# Patient Record
Sex: Male | Born: 1950
Health system: Southern US, Community
[De-identification: ages and names within clinical notes are randomized; demographics above are authoritative.]

## PROBLEM LIST (undated history)

## (undated) DIAGNOSIS — E785 Hyperlipidemia, unspecified: Secondary | ICD-10-CM

## (undated) DIAGNOSIS — J302 Other seasonal allergic rhinitis: Secondary | ICD-10-CM

## (undated) DIAGNOSIS — I10 Essential (primary) hypertension: Secondary | ICD-10-CM

## (undated) DIAGNOSIS — C801 Malignant (primary) neoplasm, unspecified: Secondary | ICD-10-CM

## (undated) DIAGNOSIS — H269 Unspecified cataract: Secondary | ICD-10-CM

## (undated) DIAGNOSIS — R0600 Dyspnea, unspecified: Secondary | ICD-10-CM

## (undated) DIAGNOSIS — B1920 Unspecified viral hepatitis C without hepatic coma: Secondary | ICD-10-CM

## (undated) DIAGNOSIS — T7840XA Allergy, unspecified, initial encounter: Secondary | ICD-10-CM

## (undated) DIAGNOSIS — Z87442 Personal history of urinary calculi: Secondary | ICD-10-CM

## (undated) DIAGNOSIS — N189 Chronic kidney disease, unspecified: Secondary | ICD-10-CM

## (undated) DIAGNOSIS — Z973 Presence of spectacles and contact lenses: Secondary | ICD-10-CM

## (undated) DIAGNOSIS — Z9289 Personal history of other medical treatment: Secondary | ICD-10-CM

## (undated) HISTORY — PX: COLONOSCOPY: SHX174

## (undated) HISTORY — DX: Essential (primary) hypertension: I10

## (undated) HISTORY — PX: CHEST TUBE INSERTION: SHX231

## (undated) HISTORY — PX: PORT-A-CATH REMOVAL: SHX5289

## (undated) HISTORY — DX: Hyperlipidemia, unspecified: E78.5

## (undated) HISTORY — DX: Unspecified cataract: H26.9

## (undated) HISTORY — PX: LUNG SURGERY: SHX703

## (undated) HISTORY — DX: Chronic kidney disease, unspecified: N18.9

## (undated) HISTORY — DX: Allergy, unspecified, initial encounter: T78.40XA

## (undated) HISTORY — PX: POLYPECTOMY: SHX149

---

## 2004-11-08 ENCOUNTER — Ambulatory Visit: Payer: Self-pay | Admitting: Internal Medicine

## 2004-11-16 ENCOUNTER — Ambulatory Visit: Payer: Self-pay | Admitting: Internal Medicine

## 2004-12-13 LAB — HM COLONOSCOPY

## 2005-05-28 ENCOUNTER — Ambulatory Visit: Payer: Self-pay | Admitting: Gastroenterology

## 2005-06-15 ENCOUNTER — Ambulatory Visit: Payer: Self-pay | Admitting: Gastroenterology

## 2005-06-15 ENCOUNTER — Encounter (INDEPENDENT_AMBULATORY_CARE_PROVIDER_SITE_OTHER): Payer: Self-pay | Admitting: Specialist

## 2006-07-08 ENCOUNTER — Ambulatory Visit: Payer: Self-pay | Admitting: Internal Medicine

## 2006-10-10 ENCOUNTER — Ambulatory Visit: Payer: Self-pay | Admitting: Internal Medicine

## 2006-10-10 LAB — CONVERTED CEMR LAB
Alkaline Phosphatase: 86 units/L (ref 39–117)
Basophils Absolute: 0 10*3/uL (ref 0.0–0.1)
CO2: 26 meq/L (ref 19–32)
Calcium: 9.2 mg/dL (ref 8.4–10.5)
Chol/HDL Ratio, serum: 2.8
Cholesterol: 130 mg/dL (ref 0–200)
Glomerular Filtration Rate, Af Am: 100 mL/min/{1.73_m2}
Glucose, Bld: 105 mg/dL — ABNORMAL HIGH (ref 70–99)
LDL Cholesterol: 69 mg/dL (ref 0–99)
Lymphocytes Relative: 34.6 % (ref 12.0–46.0)
MCV: 88.3 fL (ref 78.0–100.0)
Monocytes Absolute: 0.9 10*3/uL — ABNORMAL HIGH (ref 0.2–0.7)
Monocytes Relative: 13 % — ABNORMAL HIGH (ref 3.0–11.0)
Neutro Abs: 3.6 10*3/uL (ref 1.4–7.7)
Platelets: 112 10*3/uL — ABNORMAL LOW (ref 150–400)
Potassium: 3.9 meq/L (ref 3.5–5.1)
TSH: 1.68 microintl units/mL (ref 0.35–5.50)
Total Protein: 7.1 g/dL (ref 6.0–8.3)

## 2006-10-15 ENCOUNTER — Ambulatory Visit: Payer: Self-pay | Admitting: Internal Medicine

## 2007-12-29 ENCOUNTER — Ambulatory Visit: Payer: Self-pay | Admitting: Internal Medicine

## 2007-12-29 DIAGNOSIS — J309 Allergic rhinitis, unspecified: Secondary | ICD-10-CM | POA: Insufficient documentation

## 2007-12-29 DIAGNOSIS — I10 Essential (primary) hypertension: Secondary | ICD-10-CM

## 2007-12-29 DIAGNOSIS — F411 Generalized anxiety disorder: Secondary | ICD-10-CM

## 2008-01-26 ENCOUNTER — Ambulatory Visit: Payer: Self-pay | Admitting: Internal Medicine

## 2008-06-08 ENCOUNTER — Telehealth: Payer: Self-pay | Admitting: Internal Medicine

## 2008-06-08 ENCOUNTER — Emergency Department (HOSPITAL_COMMUNITY): Admission: EM | Admit: 2008-06-08 | Discharge: 2008-06-08 | Payer: Self-pay | Admitting: Emergency Medicine

## 2009-05-18 ENCOUNTER — Ambulatory Visit: Payer: Self-pay | Admitting: Internal Medicine

## 2009-05-18 LAB — CONVERTED CEMR LAB
ALT: 43 units/L (ref 0–53)
AST: 43 units/L — ABNORMAL HIGH (ref 0–37)
Alkaline Phosphatase: 76 units/L (ref 39–117)
BUN: 12 mg/dL (ref 6–23)
Calcium: 9.3 mg/dL (ref 8.4–10.5)
Eosinophils Relative: 1.1 % (ref 0.0–5.0)
GFR calc non Af Amer: 128.23 mL/min (ref >60)
HCT: 47.4 % (ref 39.0–52.0)
Hemoglobin: 16.3 g/dL (ref 13.0–17.0)
LDL Cholesterol: 76 mg/dL (ref 0–99)
Lymphocytes Relative: 33 % (ref 12.0–46.0)
Lymphs Abs: 2.5 10*3/uL (ref 0.7–4.0)
Monocytes Relative: 9.7 % (ref 3.0–12.0)
Platelets: 93 10*3/uL — ABNORMAL LOW (ref 150.0–400.0)
Potassium: 4 meq/L (ref 3.5–5.1)
Protein, U semiquant: NEGATIVE
Sodium: 142 meq/L (ref 135–145)
TSH: 1.28 microintl units/mL (ref 0.35–5.50)
Total Bilirubin: 1 mg/dL (ref 0.3–1.2)
Urobilinogen, UA: 0.2
VLDL: 14.6 mg/dL (ref 0.0–40.0)
WBC Urine, dipstick: NEGATIVE
WBC: 7.6 10*3/uL (ref 4.5–10.5)

## 2009-05-25 ENCOUNTER — Ambulatory Visit: Payer: Self-pay | Admitting: Internal Medicine

## 2009-05-25 DIAGNOSIS — F528 Other sexual dysfunction not due to a substance or known physiological condition: Secondary | ICD-10-CM

## 2011-01-05 ENCOUNTER — Other Ambulatory Visit (INDEPENDENT_AMBULATORY_CARE_PROVIDER_SITE_OTHER): Payer: 59 | Admitting: Internal Medicine

## 2011-01-05 DIAGNOSIS — Z0000 Encounter for general adult medical examination without abnormal findings: Secondary | ICD-10-CM

## 2011-01-05 LAB — BASIC METABOLIC PANEL
BUN: 14 mg/dL (ref 6–23)
CO2: 27 mEq/L (ref 19–32)
Chloride: 107 mEq/L (ref 96–112)
Creatinine, Ser: 1 mg/dL (ref 0.4–1.5)
Glucose, Bld: 95 mg/dL (ref 70–99)
Potassium: 3.7 mEq/L (ref 3.5–5.1)

## 2011-01-05 LAB — POCT URINALYSIS DIPSTICK
Blood, UA: NEGATIVE
Glucose, UA: NEGATIVE
Ketones, UA: NEGATIVE
Protein, UA: NEGATIVE
Spec Grav, UA: 1.01
Urobilinogen, UA: 1

## 2011-01-05 LAB — TSH: TSH: 1.6 u[IU]/mL (ref 0.35–5.50)

## 2011-01-05 LAB — CBC WITH DIFFERENTIAL/PLATELET
Eosinophils Absolute: 0.1 10*3/uL (ref 0.0–0.7)
HCT: 46.9 % (ref 39.0–52.0)
Lymphs Abs: 2.4 10*3/uL (ref 0.7–4.0)
MCHC: 34.1 g/dL (ref 30.0–36.0)
MCV: 87.8 fl (ref 78.0–100.0)
Monocytes Absolute: 0.9 10*3/uL (ref 0.1–1.0)
Neutrophils Relative %: 54.6 % (ref 43.0–77.0)
Platelets: 98 10*3/uL — ABNORMAL LOW (ref 150.0–400.0)
RDW: 13.9 % (ref 11.5–14.6)
WBC: 7.6 10*3/uL (ref 4.5–10.5)

## 2011-01-05 LAB — HEPATIC FUNCTION PANEL
ALT: 46 U/L (ref 0–53)
Bilirubin, Direct: 0.2 mg/dL (ref 0.0–0.3)
Total Bilirubin: 0.6 mg/dL (ref 0.3–1.2)

## 2011-01-05 LAB — LIPID PANEL
Cholesterol: 126 mg/dL (ref 0–200)
HDL: 41.8 mg/dL (ref >39.00)
LDL Cholesterol: 68 mg/dL (ref 0–99)
Total CHOL/HDL Ratio: 3
Triglycerides: 80 mg/dL (ref 0.0–149.0)

## 2011-01-12 ENCOUNTER — Encounter: Payer: Self-pay | Admitting: Internal Medicine

## 2011-01-12 ENCOUNTER — Ambulatory Visit (INDEPENDENT_AMBULATORY_CARE_PROVIDER_SITE_OTHER): Payer: 59 | Admitting: Internal Medicine

## 2011-01-12 DIAGNOSIS — Z0000 Encounter for general adult medical examination without abnormal findings: Secondary | ICD-10-CM

## 2011-01-12 DIAGNOSIS — I10 Essential (primary) hypertension: Secondary | ICD-10-CM

## 2011-01-12 NOTE — Assessment & Plan Note (Signed)
Patient presents for yearly preventative medicine examination.   all immunizations and health maintenance protocols were reviewed with the patient and they are up to date with these protocols.   screening laboratory values were reviewed with the patient including screening of hyperlipidemia PSA renal function and hepatic function.   There medications past medical history social history problem list and allergies were reviewed in detail.   Goals were established with regard to weight loss exercise diet in compliance with medications  

## 2011-01-12 NOTE — Progress Notes (Signed)
  Subjective:    Patient ID: Jermaine Moody, male    DOB: 02-Aug-1951, 60 y.o.   MRN: [id number]  HPI    the patient is a 60 year old African American male who presents for his yearly wellness examination he is without complaints today specifically denies any chest pain shortness of breath PND orthopnea GU or GI symptoms he states he's been feeling well has been compliant with his medications has actually lost weight and followed up at her low cholesterol diet   Review of Systems  Constitutional: Negative for fever and fatigue.  HENT: Negative for hearing loss, congestion, neck pain and postnasal drip.   Eyes: Negative for discharge, redness and visual disturbance.  Respiratory: Negative for cough, shortness of breath and wheezing.   Cardiovascular: Negative for leg swelling.  Gastrointestinal: Negative for abdominal pain, constipation and abdominal distention.  Genitourinary: Negative for urgency and frequency.  Musculoskeletal: Negative for joint swelling and arthralgias.  Skin: Negative for color change and rash.  Neurological: Negative for weakness and light-headedness.  Hematological: Negative for adenopathy.  Psychiatric/Behavioral: Negative for behavioral problems.       Past Medical History  Diagnosis Date  . Hypertension   . Allergy   . Anxiety    Past Surgical History  Procedure Date  . Punctured lung   . Chest tube insertion     reports that he quit smoking about 25 years ago. He does not have any smokeless tobacco history on file. He reports that he does not drink alcohol or use illicit drugs. family history includes Heart disease in his father and mother.     Objective:   Physical Exam  Constitutional: He is oriented to person, place, and time. He appears well-developed and well-nourished.  HENT:  Head: Normocephalic and atraumatic.  Eyes: Conjunctivae are normal. Pupils are equal, round, and reactive to light.  Neck: Normal range of motion. Neck supple.    Cardiovascular: Normal rate and regular rhythm.   Pulmonary/Chest: Effort normal and breath sounds normal.  Abdominal: Soft. Bowel sounds are normal.  Genitourinary: Prostate normal.  Musculoskeletal: Normal range of motion.  Neurological: He is alert and oriented to person, place, and time.  Skin: Skin is warm.        Hyperpigmented lesions on the lower extremities especially the shins  Psychiatric: He has a normal mood and affect. His behavior is normal.          Assessment & Plan:   Patient presents for yearly preventative medicine examination.   all immunizations and health maintenance protocols were reviewed with the patient and they are up to date with these protocols.   screening laboratory values were reviewed with the patient including screening of hyperlipidemia PSA renal function and hepatic function.   There medications past medical history social history problem list and allergies were reviewed in detail.   Goals were established with regard to weight loss exercise diet in compliance with medications

## 2011-01-12 NOTE — Assessment & Plan Note (Signed)
Stable on medications

## 2011-02-23 ENCOUNTER — Other Ambulatory Visit: Payer: Self-pay | Admitting: *Deleted

## 2011-02-23 DIAGNOSIS — I10 Essential (primary) hypertension: Secondary | ICD-10-CM

## 2011-02-23 MED ORDER — AMLODIPINE BESYLATE 5 MG PO TABS: 5.0000 mg | ORAL_TABLET | Freq: Every day | ORAL | Status: DC

## 2011-03-19 ENCOUNTER — Other Ambulatory Visit: Payer: Self-pay | Admitting: *Deleted

## 2011-03-19 MED ORDER — HYDROCHLOROTHIAZIDE 12.5 MG PO CAPS: 12.5000 mg | ORAL_CAPSULE | Freq: Every day | ORAL | Status: DC

## 2011-10-15 ENCOUNTER — Telehealth: Payer: Self-pay | Admitting: Internal Medicine

## 2011-10-15 MED ORDER — PROMETHAZINE HCL 25 MG PO TABS: 25.0000 mg | ORAL_TABLET | Freq: Four times a day (QID) | ORAL | Status: DC | PRN

## 2011-10-15 NOTE — Telephone Encounter (Signed)
Pts spouse called and said that he has been vomitting all day and his lower back on rt side is hurting.Vomit is clear and foamy. Pt can not keep anything down. [place] are not moving.  Pts wife is req that nurse and find out what pt needs to do?

## 2011-10-16 ENCOUNTER — Ambulatory Visit
Admission: RE | Admit: 2011-10-16 | Discharge: 2011-10-16 | Disposition: A | Discharge status: Home / Self Care | Payer: 59 | Source: Ambulatory Visit | Attending: Family Medicine | Admitting: Family Medicine

## 2011-10-16 ENCOUNTER — Other Ambulatory Visit: Payer: Self-pay | Admitting: Family Medicine

## 2011-10-16 ENCOUNTER — Telehealth: Payer: Self-pay

## 2011-10-16 NOTE — Telephone Encounter (Signed)
Called pt to make aware to take to ER.

## 2011-10-16 NOTE — Telephone Encounter (Signed)
Jermaine Moody called and stated pt continues to vomit and continues to take the phenergan every 6 hours.  Pt would like to know what he needs to do.  Pt is having some pain on his side as well.  Pls advise.

## 2011-10-16 NOTE — Telephone Encounter (Signed)
Per dr Lovell Sheehan- to er for evaluation

## 2011-10-17 ENCOUNTER — Telehealth: Payer: Self-pay

## 2011-10-17 ENCOUNTER — Other Ambulatory Visit: Payer: Self-pay | Admitting: Internal Medicine

## 2011-10-17 ENCOUNTER — Ambulatory Visit (INDEPENDENT_AMBULATORY_CARE_PROVIDER_SITE_OTHER): Payer: 59

## 2011-10-17 DIAGNOSIS — D7289 Other specified disorders of white blood cells: Secondary | ICD-10-CM

## 2011-10-17 DIAGNOSIS — R112 Nausea with vomiting, unspecified: Secondary | ICD-10-CM

## 2011-10-17 DIAGNOSIS — D72829 Elevated white blood cell count, unspecified: Secondary | ICD-10-CM

## 2011-10-17 DIAGNOSIS — K829 Disease of gallbladder, unspecified: Secondary | ICD-10-CM

## 2011-10-17 DIAGNOSIS — R1011 Right upper quadrant pain: Secondary | ICD-10-CM

## 2011-10-17 NOTE — Telephone Encounter (Signed)
Talked with pt and he states he went back to urgent care and md had called surgeon and discuss [place] an d decided he didn't need to go-didmore test while there and pt states they will get back in touch with him,

## 2011-10-17 NOTE — Telephone Encounter (Signed)
Per dr Lovell Sheehan- n eeds to see surgeon for gallstones

## 2011-10-17 NOTE — Telephone Encounter (Signed)
Pt has been called 5 times with no answer.  Left message on machine To please return call. Have called mobile 3 times with no response----next of kin stephanie was called and number has been disconnected-- will continue to tcall pt-

## 2011-10-17 NOTE — Telephone Encounter (Signed)
Pt went to [facility] and had an abdominal x-ray done and found out pt's wbc is elevated and per pt's wife they do not know what is wrong with pt and what he needs to do next. Pls advise.

## 2011-10-18 ENCOUNTER — Ambulatory Visit (INDEPENDENT_AMBULATORY_CARE_PROVIDER_SITE_OTHER)
Admission: RE | Admit: 2011-10-18 | Discharge: 2011-10-18 | Disposition: A | Discharge status: Home / Self Care | Payer: 59 | Source: Ambulatory Visit | Attending: Internal Medicine | Admitting: Internal Medicine

## 2011-10-18 ENCOUNTER — Other Ambulatory Visit: Payer: 59

## 2011-10-18 DIAGNOSIS — K829 Disease of gallbladder, unspecified: Secondary | ICD-10-CM

## 2011-10-18 DIAGNOSIS — D72829 Elevated white blood cell count, unspecified: Secondary | ICD-10-CM

## 2011-10-18 MED ORDER — IOHEXOL 300 MG/ML  SOLN
100.0000 mL | Freq: Once | INTRAMUSCULAR | Status: AC | PRN
  Administered 2011-10-18: 100 mL via INTRAVENOUS

## 2011-10-22 ENCOUNTER — Telehealth: Payer: Self-pay | Admitting: Internal Medicine

## 2011-10-22 NOTE — Telephone Encounter (Signed)
Pt informed

## 2011-10-22 NOTE — Telephone Encounter (Signed)
Requesting CT results from last week. Thanks.

## 2012-01-21 ENCOUNTER — Encounter: Payer: Self-pay | Admitting: Family

## 2012-01-21 ENCOUNTER — Ambulatory Visit (INDEPENDENT_AMBULATORY_CARE_PROVIDER_SITE_OTHER): Payer: 59 | Admitting: Family

## 2012-01-21 VITALS — BP 142/98 | Temp 98.9°F | Wt 232.0 lb

## 2012-01-21 DIAGNOSIS — J309 Allergic rhinitis, unspecified: Secondary | ICD-10-CM

## 2012-01-21 DIAGNOSIS — J019 Acute sinusitis, unspecified: Secondary | ICD-10-CM

## 2012-01-21 MED ORDER — AMOXICILLIN 500 MG PO TABS: 1000.0000 mg | ORAL_TABLET | Freq: Two times a day (BID) | ORAL | Status: DC

## 2012-01-21 MED ORDER — METHYLPREDNISOLONE 4 MG PO KIT: PACK | ORAL | Status: AC

## 2012-01-21 NOTE — Patient Instructions (Signed)

## 2012-01-21 NOTE — Progress Notes (Signed)
Subjective:    Patient ID: Jermaine Moody, male    DOB: [birth year], 61 y.o.   MRN: [id number]  HPI 61 year old African American male, nonsmoker, patient of Dr. Lovell Sheehan in today with complaints of sneezing, coughing, congestion that's been going on for 2 weeks, and worsening. Hasn't taken over-the-counter Zyrtec that initially was helping but is no longer helping her symptoms. He's also been taken home remedies without relief. He denies any lightheadedness, dizziness, chest pain, palpitations, shortness of breath or edema.  Patient has left FMLA to for Dr. Lovell Sheehan to fill out regarding cholelithiasis.   Review of Systems  Constitutional: Positive for fatigue.  HENT: Positive for congestion, sneezing, postnasal drip and sinus pressure.   Respiratory: Positive for cough.   Cardiovascular: Negative.   Gastrointestinal: Negative.   Genitourinary: Negative.   Musculoskeletal: Negative.   Skin: Negative.   Neurological: Negative.   Hematological: Negative.   Psychiatric/Behavioral: Negative.    Past Medical History  Diagnosis Date  . Hypertension   . Allergy   . Anxiety     History   Social History  . Marital Status: Married    Spouse Name: N/A    Number of Children: N/A  . Years of Education: N/A   Occupational History  . Not on file.   Social History Main Topics  . Smoking status: Former Smoker    Quit date: 01/11/1986  . Smokeless tobacco: Not on file   Comment: stopped in 1987 after smoking for 15 years  . Alcohol Use: No  . Drug Use: No  . Sexually Active: Not Currently   Other Topics Concern  . Not on file   Social History Narrative  . No narrative on file    Past Surgical History  Procedure Date  . Punctured lung   . Chest tube insertion     Family History  Problem Relation Age of Onset  . Heart disease Mother   . Heart disease Father     No Known Allergies  Current Outpatient Prescriptions on File Prior to Visit  Medication Sig Dispense  Refill  . amLODipine (NORVASC) 5 MG tablet Take 1 tablet (5 mg total) by mouth daily.  30 tablet  11  . hydrochlorothiazide (,MICROZIDE/HYDRODIURIL,) 12.5 MG capsule Take 1 capsule (12.5 mg total) by mouth daily.  60 capsule  3    BP 142/98  Temp(Src) 98.9 F (37.2 C) (Oral)  Wt 232 lb (105.235 kg)chart    Objective:   Physical Exam  Constitutional: He is oriented to person, place, and time. He appears well-nourished.  HENT:  Right Ear: External ear normal.  Left Ear: External ear normal.  Nose: Nose normal.  Mouth/Throat: Oropharynx is clear and moist.       Maxillary sinus tenderness to palpation.  Neck: Normal range of motion. Neck supple.  Cardiovascular: Normal rate, regular rhythm and normal heart sounds.   Pulmonary/Chest: Effort normal.  Musculoskeletal: Normal range of motion.  Neurological: He is oriented to person, place, and time.  Skin: Skin is warm and dry.  Psychiatric: He has a normal mood and affect.          Assessment & Plan:  Assessment: Acute sinusitis, cough  Plan: Medrol Dosepak and given by mouth x1. Amoxicillin 500 mg 2 capsules by mouth twice a day x10 days. Report was given to Dr. Lovell Sheehan nurse today to affect him only form completed regarding the cholelithiasis. Rest. Drink plenty of fluids. Patient called the office symptoms worsen or persist for recheck and  sooner when necessary.

## 2012-01-22 ENCOUNTER — Telehealth: Payer: Self-pay | Admitting: Internal Medicine

## 2012-01-22 MED ORDER — AMOXICILLIN 500 MG PO TABS: 1000.0000 mg | ORAL_TABLET | Freq: Two times a day (BID) | ORAL | Status: AC

## 2012-01-22 NOTE — Telephone Encounter (Signed)
Pt wife is waiting for amoxicillin to be  call into [place] [facility]

## 2012-01-22 NOTE — Telephone Encounter (Signed)
Rx was sent to [facility] originally.   Now sent to [facility]

## 2012-05-01 ENCOUNTER — Other Ambulatory Visit: Payer: Self-pay | Admitting: Internal Medicine

## 2013-06-12 ENCOUNTER — Ambulatory Visit (INDEPENDENT_AMBULATORY_CARE_PROVIDER_SITE_OTHER): Payer: 59 | Admitting: Internal Medicine

## 2013-06-12 ENCOUNTER — Encounter: Payer: Self-pay | Admitting: Internal Medicine

## 2013-06-12 VITALS — BP 140/86 | HR 72 | Temp 98.2°F | Resp 16 | Ht 71.0 in | Wt 238.0 lb

## 2013-06-12 DIAGNOSIS — L237 Allergic contact dermatitis due to plants, except food: Secondary | ICD-10-CM

## 2013-06-12 DIAGNOSIS — L255 Unspecified contact dermatitis due to plants, except food: Secondary | ICD-10-CM

## 2013-06-12 DIAGNOSIS — I10 Essential (primary) hypertension: Secondary | ICD-10-CM

## 2013-06-12 LAB — BASIC METABOLIC PANEL
BUN: 11 mg/dL (ref 6–23)
CO2: 29 mEq/L (ref 19–32)
Calcium: 9.7 mg/dL (ref 8.4–10.5)
Chloride: 104 mEq/L (ref 96–112)
Creat: 0.89 mg/dL (ref 0.50–1.35)

## 2013-06-12 MED ORDER — METHYLPREDNISOLONE (PAK) 4 MG PO TABS: ORAL_TABLET | ORAL | Status: DC

## 2013-06-12 NOTE — Addendum Note (Signed)
Addended by: Rita Ohara R on: 06/12/2013 04:23 PM   Modules accepted: Orders

## 2013-06-12 NOTE — Progress Notes (Signed)
  Subjective:    Patient ID: Jermaine Moody, male    DOB: [birth year], 62 y.o.   MRN: [id number]  HPI  Follow up for HTN Over weight has not been exercising Has poison ivy Not exercising and we discussed weigth and risk for DM HTN stable not chest pains    Review of Systems  Constitutional: Negative for fever and fatigue.  HENT: Negative for hearing loss, congestion, neck pain and postnasal drip.   Eyes: Negative for discharge, redness and visual disturbance.  Respiratory: Negative for cough, shortness of breath and wheezing.   Cardiovascular: Negative for leg swelling.  Gastrointestinal: Negative for abdominal pain, constipation and abdominal distention.  Genitourinary: Negative for urgency and frequency.  Musculoskeletal: Negative for joint swelling and arthralgias.  Skin: Negative for color change and rash.  Neurological: Negative for weakness and light-headedness.  Hematological: Negative for adenopathy.  Psychiatric/Behavioral: Negative for behavioral problems.   Past Medical History  Diagnosis Date  . Hypertension   . Allergy   . Anxiety     History   Social History  . Marital Status: Married    Spouse Name: N/A    Number of Children: N/A  . Years of Education: N/A   Occupational History  . Not on file.   Social History Main Topics  . Smoking status: Former Smoker    Quit date: 01/11/1986  . Smokeless tobacco: Not on file     Comment: stopped in 1987 after smoking for 15 years  . Alcohol Use: No  . Drug Use: No  . Sexually Active: Not Currently   Other Topics Concern  . Not on file   Social History Narrative  . No narrative on file    Past Surgical History  Procedure Laterality Date  . Punctured lung    . Chest tube insertion      Family History  Problem Relation Age of Onset  . Heart disease Mother   . Heart disease Father     No Known Allergies  Current Outpatient Prescriptions on File Prior to Visit  Medication Sig Dispense Refill   . amLODipine (NORVASC) 5 MG tablet TAKE 1 TABLET BY MOUTH ONCE DAILY  30 tablet  PRN  . hydrochlorothiazide (MICROZIDE) 12.5 MG capsule TAKE 1 CAPSULE BY MOUTH DAILY.  60 capsule  PRN   No current facility-administered medications on file prior to visit.    BP 140/86  Pulse 72  Temp(Src) 98.2 F (36.8 C)  Resp 16  Ht 5\' 11"  (1.803 m)  Wt 238 lb (107.956 kg)  BMI 33.21 kg/m2   ]    Objective:   Physical Exam  Nursing note and vitals reviewed. Constitutional: He appears well-developed and well-nourished.  HENT:  Head: Normocephalic and atraumatic.  Eyes: Conjunctivae are normal. Pupils are equal, round, and reactive to light.  Neck: Normal range of motion. Neck supple.  Cardiovascular: Normal rate and regular rhythm.   Pulmonary/Chest: Effort normal and breath sounds normal.  Abdominal: Soft. Bowel sounds are normal.          Assessment & Plan:  His blood pressures at goal.  The patient has mild allergic rhinitis stable.  The patient has atopic dermatitis most probably be from Plant exposure (poison ivy) we'll give him a Medrol Dosepak   A basic metabolic panel will be obtained today looking kidney to function and to screen for diabetes.

## 2013-06-12 NOTE — Patient Instructions (Signed)
Calorie Counting Diet A calorie counting diet requires you to eat the number of calories that are right for you in a day. Calories are the measurement of how much energy you get from the food you eat. Eating the right amount of calories is important for staying at a healthy weight. If you eat too many calories, your body will store them as fat and you may gain weight. If you eat too few calories, you may lose weight. Counting the number of calories you eat during a day will help you know if you are eating the right amount. A Registered Dietitian can determine how many calories you need in a day. The amount of calories needed varies from person to person. If your goal is to lose weight, you will need to eat fewer calories. Losing weight can benefit you if you are overweight or have health problems such as heart disease, high blood pressure, or diabetes. If your goal is to gain weight, you will need to eat more calories. Gaining weight may be necessary if you have a certain health problem that causes your body to need more energy. TIPS Whether you are increasing or decreasing the number of calories you eat during a day, it may be hard to get used to changes in what you eat and drink. The following are tips to help you keep track of the number of calories you eat.  Measure foods at home with measuring cups. This helps you know the amount of food and number of calories you are eating.  Restaurants often serve food in amounts that are larger than 1 serving. While eating out, estimate how many servings of a food you are given. For example, a serving of cooked rice is  cup or about the size of half of a fist. Knowing serving sizes will help you be aware of how much food you are eating at restaurants.  Ask for smaller portion sizes or child-size portions at restaurants.  Plan to eat half of a meal at a restaurant. Take the rest home or share the other half with a friend.  Read the Nutrition Facts panel on  food labels for calorie content and serving size. You can find out how many servings are in a package, the size of a serving, and the number of calories each serving has.  For example, a package might contain 3 cookies. The Nutrition Facts panel on that package says that 1 serving is 1 cookie. Below that, it will say there are 3 servings in the container. The calories section of the Nutrition Facts label says there are 90 calories. This means there are 90 calories in 1 cookie (1 serving). If you eat 1 cookie you have eaten 90 calories. If you eat all 3 cookies, you have eaten 270 calories (3 servings x 90 calories = 270 calories). The list below tells you how big or small some common portion sizes are.  1 oz.........4 stacked dice.  3 oz.........Deck of cards.  1 tsp........Tip of little finger.  1 tbs........Thumb.  2 tbs........Golf ball.   cup.......Half of a fist.  1 cup........A fist. KEEP A FOOD LOG Write down every food item you eat, the amount you eat, and the number of calories in each food you eat during the day. At the end of the day, you can add up the total number of calories you have eaten. It may help to keep a list like the one below. Find out the calorie information by reading the   Nutrition Facts panel on food labels. Breakfast  Bran cereal (1 cup, 110 calories).  Fat-free milk ( cup, 45 calories). Snack  Apple (1 medium, 80 calories). Lunch  Spinach (1 cup, 20 calories).  Tomato ( medium, 20 calories).  Chicken breast strips (3 oz, 165 calories).  Shredded cheddar cheese ( cup, 110 calories).  Light Italian dressing (2 tbs, 60 calories).  Whole-wheat bread (1 slice, 80 calories).  Tub margarine (1 tsp, 35 calories).  Vegetable soup (1 cup, 160 calories). Dinner  Pork chop (3 oz, 190 calories).  Brown rice (1 cup, 215 calories).  Steamed broccoli ( cup, 20 calories).  Strawberries (1  cup, 65 calories).  Whipped cream (1 tbs, 50  calories). Daily Calorie Total: 1425 Document Released: 10/29/2005 Document Revised: 01/21/2012 Document Reviewed: 04/25/2007 [employer] Patient Information 2014 [employer].  

## 2013-11-02 ENCOUNTER — Other Ambulatory Visit: Payer: Self-pay | Admitting: Internal Medicine

## 2013-12-18 ENCOUNTER — Ambulatory Visit (INDEPENDENT_AMBULATORY_CARE_PROVIDER_SITE_OTHER): Payer: 59 | Admitting: Internal Medicine

## 2013-12-18 ENCOUNTER — Encounter: Payer: Self-pay | Admitting: Internal Medicine

## 2013-12-18 VITALS — BP 140/80 | HR 72 | Temp 98.3°F | Resp 16 | Ht 71.0 in | Wt 232.0 lb

## 2013-12-18 DIAGNOSIS — Z2911 Encounter for prophylactic immunotherapy for respiratory syncytial virus (RSV): Secondary | ICD-10-CM

## 2013-12-18 DIAGNOSIS — I10 Essential (primary) hypertension: Secondary | ICD-10-CM

## 2013-12-18 DIAGNOSIS — Z0000 Encounter for general adult medical examination without abnormal findings: Secondary | ICD-10-CM

## 2013-12-18 DIAGNOSIS — Z23 Encounter for immunization: Secondary | ICD-10-CM

## 2013-12-18 LAB — BASIC METABOLIC PANEL
BUN: 12 mg/dL (ref 6–23)
CHLORIDE: 102 meq/L (ref 96–112)
CO2: 28 mEq/L (ref 19–32)
Calcium: 9.4 mg/dL (ref 8.4–10.5)
Creatinine, Ser: 0.9 mg/dL (ref 0.4–1.5)
GFR: 113.07 mL/min (ref >60.00)
Glucose, Bld: 80 mg/dL (ref 70–99)
Potassium: 3.8 mEq/L (ref 3.5–5.1)
SODIUM: 139 meq/L (ref 135–145)

## 2013-12-18 LAB — CBC WITH DIFFERENTIAL/PLATELET
BASOS ABS: 0 10*3/uL (ref 0.0–0.1)
Basophils Relative: 0.4 % (ref 0.0–3.0)
EOS ABS: 0.1 10*3/uL (ref 0.0–0.7)
Eosinophils Relative: 1.3 % (ref 0.0–5.0)
HCT: 50 % (ref 39.0–52.0)
HEMOGLOBIN: 16.6 g/dL (ref 13.0–17.0)
LYMPHS PCT: 32 % (ref 12.0–46.0)
Lymphs Abs: 3.2 10*3/uL (ref 0.7–4.0)
MCHC: 33.2 g/dL (ref 30.0–36.0)
MCV: 87.6 fl (ref 78.0–100.0)
MONOS PCT: 8.9 % (ref 3.0–12.0)
Monocytes Absolute: 0.9 10*3/uL (ref 0.1–1.0)
NEUTROS ABS: 5.8 10*3/uL (ref 1.4–7.7)
Neutrophils Relative %: 57.4 % (ref 43.0–77.0)
Platelets: 103 10*3/uL — ABNORMAL LOW (ref 150.0–400.0)
RBC: 5.71 Mil/uL (ref 4.22–5.81)
RDW: 14.4 % (ref 11.5–14.6)
WBC: 10 10*3/uL (ref 4.5–10.5)

## 2013-12-18 LAB — HEPATIC FUNCTION PANEL
ALK PHOS: 86 U/L (ref 39–117)
ALT: 48 U/L (ref 0–53)
AST: 51 U/L — ABNORMAL HIGH (ref 0–37)
Albumin: 4.1 g/dL (ref 3.5–5.2)
BILIRUBIN DIRECT: 0.2 mg/dL (ref 0.0–0.3)
BILIRUBIN TOTAL: 0.8 mg/dL (ref 0.3–1.2)
TOTAL PROTEIN: 8 g/dL (ref 6.0–8.3)

## 2013-12-18 LAB — LIPID PANEL
CHOL/HDL RATIO: 3
Cholesterol: 140 mg/dL (ref 0–200)
HDL: 53 mg/dL (ref >39.00)
LDL CALC: 70 mg/dL (ref 0–99)
Triglycerides: 83 mg/dL (ref 0.0–149.0)
VLDL: 16.6 mg/dL (ref 0.0–40.0)

## 2013-12-18 LAB — PSA: PSA: 0.44 ng/mL (ref 0.10–4.00)

## 2013-12-18 LAB — TSH: TSH: 1.73 u[IU]/mL (ref 0.35–5.50)

## 2013-12-18 NOTE — Progress Notes (Signed)
Subjective:    Patient ID: Jermaine Moody, male    DOB: 02-23-1951, 63 y.o.   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  He has been exercising more lately and has lost some weight blood pressure is well controlled on his current medications he has no complaints specifically no chest pain shortness of breath PND orthopnea no swelling  Review of Systems  Constitutional: Negative for fever and fatigue.  HENT: Negative for congestion, hearing loss and postnasal drip.   Eyes: Negative for discharge, redness and visual disturbance.  Respiratory: Negative for cough, shortness of breath and wheezing.   Cardiovascular: Negative for leg swelling.  Gastrointestinal: Negative for abdominal pain, constipation and abdominal distention.  Genitourinary: Negative for urgency and frequency.  Musculoskeletal: Negative for arthralgias, joint swelling and neck pain.  Skin: Negative for color change and rash.  Neurological: Negative for weakness and light-headedness.  Hematological: Negative for adenopathy.  Psychiatric/Behavioral: Negative for behavioral problems.   Past Medical History  Diagnosis Date  . Hypertension   . Allergy   . Anxiety     History   Social History  . Marital Status: Married    Spouse Name: N/A    Number of Children: N/A  . Years of Education: N/A   Occupational History  . Not on file.   Social History Main Topics  . Smoking status: Former Smoker    Quit date: 01/11/1986  . Smokeless tobacco: Not on file     Comment: stopped in 1987 after smoking for 15 years  . Alcohol Use: No  . Drug Use: No  . Sexual Activity: Not Currently   Other Topics Concern  . Not on file   Social History Narrative  . No narrative on file    Past Surgical History  Procedure Laterality Date  . Punctured lung    . Chest tube insertion      Family History  Problem Relation Age of Onset   . Heart disease Mother   . Heart disease Father     No Known Allergies  Current Outpatient Prescriptions on File Prior to Visit  Medication Sig Dispense Refill  . amLODipine (NORVASC) 5 MG tablet TAKE 1 TABLET BY MOUTH ONCE DAILY  30 tablet  PRN  . hydrochlorothiazide (MICROZIDE) 12.5 MG capsule TAKE 1 CAPSULE BY MOUTH DAILY.  60 capsule  PRN   No current facility-administered medications on file prior to visit.    BP 140/80  Pulse 72  Temp(Src) 98.3 F (36.8 C)  Resp 16  Ht 5\' 11"  (1.803 m)  Wt 232 lb (105.235 kg)  BMI 32.37 kg/m2       Objective:   Physical Exam  Constitutional: He is oriented to person, place, and time. He appears well-developed and well-nourished.  HENT:  Head: Normocephalic and atraumatic.  Eyes: Conjunctivae are normal. Pupils are equal, round, and reactive to light.  Neck: Normal range of motion. Neck supple.  Cardiovascular: Normal rate and regular rhythm.   Pulmonary/Chest: Effort normal and breath sounds normal.  Abdominal: Soft. Bowel sounds are normal.  Musculoskeletal: Normal range of motion.  Neurological: He is alert and oriented to person, place, and time.  Skin: Skin is warm and dry.  Psychiatric: He has a normal mood and affect. His behavior is normal.          Assessment & Plan:   Patient presents for yearly preventative medicine  examination.   all immunizations and health maintenance protocols were reviewed with the patient and they are up to date with these protocols.   screening laboratory values were reviewed with the patient including screening of hyperlipidemia PSA renal function and hepatic function.   There medications past medical history social history problem list and allergies were reviewed in detail.   Goals were established with regard to weight loss exercise diet in compliance with medications

## 2013-12-18 NOTE — Progress Notes (Signed)
Pre visit review using our clinic review tool, if applicable. No additional management support is needed unless otherwise documented below in the visit note. 

## 2013-12-18 NOTE — Patient Instructions (Signed)
The patient is instructed to continue all medications as prescribed. Schedule followup with check out clerk upon leaving the clinic  

## 2013-12-18 NOTE — Addendum Note (Signed)
Addended by: Allyne Gee on: 12/18/2013 12:09 PM   Modules accepted: Orders

## 2013-12-21 ENCOUNTER — Telehealth: Payer: Self-pay | Admitting: Internal Medicine

## 2013-12-21 NOTE — Telephone Encounter (Signed)
Relevant patient education mailed to patient.  

## 2014-02-02 ENCOUNTER — Encounter: Payer: Self-pay | Admitting: Family

## 2014-02-02 ENCOUNTER — Ambulatory Visit (INDEPENDENT_AMBULATORY_CARE_PROVIDER_SITE_OTHER): Payer: 59 | Admitting: Family

## 2014-02-02 VITALS — BP 140/98 | HR 77 | Temp 98.7°F | Wt 242.0 lb

## 2014-02-02 DIAGNOSIS — M25569 Pain in unspecified knee: Secondary | ICD-10-CM

## 2014-02-02 DIAGNOSIS — M25561 Pain in right knee: Secondary | ICD-10-CM

## 2014-02-02 DIAGNOSIS — N200 Calculus of kidney: Secondary | ICD-10-CM

## 2014-02-02 DIAGNOSIS — K8020 Calculus of gallbladder without cholecystitis without obstruction: Secondary | ICD-10-CM

## 2014-02-02 DIAGNOSIS — I10 Essential (primary) hypertension: Secondary | ICD-10-CM

## 2014-02-02 MED ORDER — IBUPROFEN 600 MG PO TABS: 600.0000 mg | ORAL_TABLET | Freq: Three times a day (TID) | ORAL | Status: DC | PRN

## 2014-02-02 NOTE — Patient Instructions (Signed)
Bursitis Bursitis is a swelling and soreness (inflammation) of a fluid-filled sac (bursa) that overlies and protects a joint. It can be caused by injury, overuse of the joint, arthritis or infection. The joints most likely to be affected are the elbows, shoulders, hips and knees. HOME CARE INSTRUCTIONS   Apply ice to the affected area for 15-20 minutes each hour while awake for 2 days. Put the ice in a plastic bag and place a towel between the bag of ice and your skin.  Rest the injured joint as much as possible, but continue to put the joint through a full range of motion, 4 times per day. (The shoulder joint especially becomes rapidly "frozen" if not used.) When the pain lessens, begin normal slow movements and usual activities.  Only take over-the-counter or prescription medicines for pain, discomfort or fever as directed by your caregiver.  Your caregiver may recommend draining the bursa and injecting medicine into the bursa. This may help the healing process.  Follow all instructions for follow-up with your caregiver. This includes any orthopedic referrals, physical therapy and rehabilitation. Any delay in obtaining necessary care could result in a delay or failure of the bursitis to heal and chronic pain. SEEK IMMEDIATE MEDICAL CARE IF:   Your pain increases even during treatment.  You develop an oral temperature above 102 F (38.9 C) and have heat and inflammation over the involved bursa. MAKE SURE YOU:   Understand these instructions.  Will watch your condition.  Will get help right away if you are not doing well or get worse. Document Released: 10/26/2000 Document Revised: 01/21/2012 Document Reviewed: 09/30/2009 [employer] Patient Information 2014 [employer].  

## 2014-02-02 NOTE — Progress Notes (Signed)
Subjective:    Patient ID: Jermaine Moody, male    DOB: [birth year], 63 y.o.   MRN: [id number]  HPI  63 year old AA male, nonsmoker presenting today to fill out FMLA paperwork for gallstones. He has had kidney stones in the past and had to miss work and wants to proactive before they flare-up. He currently is not having any pain.  Also reports right knee pain.    His knee pain began three months ago after he dropped a Ecologist when he was moving.  Exercise and standing in one position makes pain worst.  Ibuprofen relieves the pain. Requesting a Rx for Ibuprofen.    Review of Systems  Constitutional: Negative.   Respiratory: Negative.   Cardiovascular: Negative.   Gastrointestinal: Negative.        History of gallstones  Genitourinary: Negative.        History of kidney stones.   Musculoskeletal: Positive for arthralgias.       Right knee pain  Skin: Negative.   Neurological: Negative.   Psychiatric/Behavioral: Negative.    Past Medical History  Diagnosis Date  . Hypertension   . Allergy   . Anxiety     History   Social History  . Marital Status: Married    Spouse Name: N/A    Number of Children: N/A  . Years of Education: N/A   Occupational History  . Not on file.   Social History Main Topics  . Smoking status: Former Smoker    Quit date: 01/11/1986  . Smokeless tobacco: Not on file     Comment: stopped in 1987 after smoking for 15 years  . Alcohol Use: No  . Drug Use: No  . Sexual Activity: Not Currently   Other Topics Concern  . Not on file   Social History Narrative  . No narrative on file    Past Surgical History  Procedure Laterality Date  . Punctured lung    . Chest tube insertion      Family History  Problem Relation Age of Onset  . Heart disease Mother   . Heart disease Father     No Known Allergies  Current Outpatient Prescriptions on File Prior to Visit  Medication Sig Dispense Refill  . amLODipine (NORVASC) 5 MG tablet TAKE 1  TABLET BY MOUTH ONCE DAILY  30 tablet  PRN  . hydrochlorothiazide (MICROZIDE) 12.5 MG capsule TAKE 1 CAPSULE BY MOUTH DAILY.  60 capsule  PRN   No current facility-administered medications on file prior to visit.    BP 140/98  Pulse 77  Temp(Src) 98.7 F (37.1 C) (Oral)  Wt 242 lb (109.77 kg)chart    Objective:   Physical Exam  Constitutional: He is oriented to person, place, and time. He appears well-developed and well-nourished.  Neck: Normal range of motion. Neck supple.  Cardiovascular: Normal rate, regular rhythm and normal heart sounds.   Pulmonary/Chest: Effort normal and breath sounds normal.  Abdominal: Soft. Bowel sounds are normal.  Musculoskeletal: Normal range of motion. He exhibits tenderness.  Right knee pain with tenderness on palpation. Mild effusion. No redness.   Neurological: He is alert and oriented to person, place, and time.  Skin: Skin is warm and dry.  Psychiatric: He has a normal mood and affect.          Assessment & Plan:  Assessment 1. Right knee pain/Bursisitis 2. Cholelithiasis  3. Renal Calculi   Plan 1. Ibuprofen 800 mg PO q6 PRN.  2.Continue current  medications 3.Contact office for any questions or concerns.

## 2014-02-02 NOTE — Progress Notes (Signed)
Pre visit review using our clinic review tool, if applicable. No additional management support is needed unless otherwise documented below in the visit note. 

## 2014-02-15 ENCOUNTER — Telehealth: Payer: Self-pay | Admitting: Internal Medicine

## 2014-02-15 MED ORDER — IBUPROFEN 600 MG PO TABS: 600.0000 mg | ORAL_TABLET | Freq: Three times a day (TID) | ORAL | Status: DC | PRN

## 2014-02-15 NOTE — Telephone Encounter (Signed)
[place], [place] re-fill on ibuprofen (ADVIL,MOTRIN) 600 MG tablet

## 2014-02-15 NOTE — Telephone Encounter (Signed)
Rx sent 

## 2014-10-18 ENCOUNTER — Ambulatory Visit (INDEPENDENT_AMBULATORY_CARE_PROVIDER_SITE_OTHER): Payer: 59 | Admitting: Family Medicine

## 2014-10-18 ENCOUNTER — Encounter: Payer: Self-pay | Admitting: Family Medicine

## 2014-10-18 VITALS — BP 140/90 | HR 89 | Temp 98.4°F | Ht 71.0 in | Wt 237.8 lb

## 2014-10-18 DIAGNOSIS — R112 Nausea with vomiting, unspecified: Secondary | ICD-10-CM

## 2014-10-18 MED ORDER — PROMETHAZINE HCL 12.5 MG PO TABS: 12.5000 mg | ORAL_TABLET | Freq: Three times a day (TID) | ORAL | Status: DC | PRN

## 2014-10-18 NOTE — Patient Instructions (Signed)
-  follow up with your new doctor as scheduled  -avoid dairy for 5-7 days  -seek care immediately if worsening or new concerns

## 2014-10-18 NOTE — Progress Notes (Signed)
Pre visit review using our clinic review tool, if applicable. No additional management support is needed unless otherwise documented below in the visit note. 

## 2014-10-18 NOTE — Progress Notes (Signed)
HPI:  Jermaine Moody is a very pleasant 63 yo prior patient of Dr Arnoldo Morale here for an urgent care visit for:  1) Nausea.abd pain: -nausea, loose stool some pressure in stomach last night -feels fine today -reports appetite is fine -denies: fever, chills, abd pain today, dysuria, hematuria, vomiting, blood in stools -hx of kidney stone remotely -hx of remote nephrolithiasis and gallstones asymptomatic - he does not want to get GB out -has appt to establish care with Dr. Berdine Addison in primary care in 2 days, wants some phenergan in case has nausea again thought ate fine all day today without symptoms   ROS: See pertinent positives and negatives per HPI.  Past Medical History  Diagnosis Date  . Hypertension   . Allergy   . Anxiety     Past Surgical History  Procedure Laterality Date  . Punctured lung    . Chest tube insertion      Family History  Problem Relation Age of Onset  . Heart disease Mother   . Heart disease Father     History   Social History  . Marital Status: Married    Spouse Name: N/A    Number of Children: N/A  . Years of Education: N/A   Social History Main Topics  . Smoking status: Former Smoker    Quit date: 01/11/1986  . Smokeless tobacco: None     Comment: stopped in 1987 after smoking for 15 years  . Alcohol Use: No  . Drug Use: No  . Sexual Activity: Not Currently   Other Topics Concern  . None   Social History Narrative    Current outpatient prescriptions: amLODipine (NORVASC) 5 MG tablet, TAKE 1 TABLET BY MOUTH ONCE DAILY, Disp: 30 tablet, Rfl: PRN;  hydrochlorothiazide (MICROZIDE) 12.5 MG capsule, TAKE 1 CAPSULE BY MOUTH DAILY., Disp: 60 capsule, Rfl: PRN;  ibuprofen (ADVIL,MOTRIN) 600 MG tablet, Take 1 tablet (600 mg total) by mouth every 8 (eight) hours as needed., Disp: 30 tablet, Rfl: 0 promethazine (PHENERGAN) 12.5 MG tablet, Take 1 tablet (12.5 mg total) by mouth every 8 (eight) hours as needed for nausea or vomiting., Disp: 10  tablet, Rfl: 0;  promethazine (PHENERGAN) 25 MG tablet, Take 1 tablet (25 mg total) by mouth every 6 (six) hours as needed for nausea., Disp: 12 tablet, Rfl: 1  EXAM:  Filed Vitals:   10/18/14 1452  BP: 140/90  Pulse: 89  Temp: 98.4 F (36.9 C)    Body mass index is 33.18 kg/(m^2).  GENERAL: vitals reviewed and listed above, alert, oriented, appears well hydrated and in no acute distress  HEENT: atraumatic, conjunttiva clear, no obvious abnormalities on inspection of external nose and ears  NECK: no obvious masses on inspection  LUNGS: clear to auscultation bilaterally, no wheezes, rales or rhonchi, good air movement  CV: HRRR, no peripheral edema  ABD: BS+, soft, NTTP, no rebound or guarding, no CVA TTP  MS: moves all extremities without noticeable abnormality  PSYCH: pleasant and cooperative, no obvious depression or anxiety  ASSESSMENT AND PLAN:  Discussed the following assessment and plan:  Non-intractable vomiting with nausea, vomiting of unspecified type - Plan: promethazine (PHENERGAN) 12.5 MG tablet  -symptoms completely resolved and query gastroenteritis versus GB  -discussed options offered labs, urine (no pain so feel nephrolithiasis less likel), [place] v CT but since symptoms completely gone and normal exam he opted for rx phenergan and follow up as scheduled with his PCP the day after tomorrow -Patient advised to return or  notify a doctor immediately if symptoms worsen or persist or new concerns arise.  There are no Patient Instructions on file for this visit.   Colin Benton R.

## 2015-05-02 ENCOUNTER — Other Ambulatory Visit: Payer: Self-pay | Admitting: Family Medicine

## 2015-05-02 DIAGNOSIS — K810 Acute cholecystitis: Secondary | ICD-10-CM

## 2015-05-05 ENCOUNTER — Other Ambulatory Visit: Payer: 59

## 2015-05-13 ENCOUNTER — Ambulatory Visit
Admission: RE | Admit: 2015-05-13 | Discharge: 2015-05-13 | Disposition: A | Discharge status: Home / Self Care | Payer: 59 | Source: Ambulatory Visit | Attending: Family Medicine | Admitting: Family Medicine

## 2015-05-13 DIAGNOSIS — K810 Acute cholecystitis: Secondary | ICD-10-CM

## 2015-05-20 ENCOUNTER — Ambulatory Visit: Payer: Self-pay | Admitting: Surgery

## 2015-05-20 NOTE — H&P (Signed)
Jermaine Moody 05/20/2015 10:26 AM Location: [facility] Patient #: [id number] DOB: [birth year] Married / Language: English / Race: Black or African American Male History of Present Illness Jermaine Moores A. Argus Caraher MD; 05/20/2015 10:55 AM) Patient words: gallbladder  Pt sent at the request of Dr Criss Rosales for episodic epigastric and RUQ abdominal pain for years. Attacks happen 3 - 4 times a year the last being severe. U/S shows numerous gallstones.        CLINICAL DATA: Right upper quadrant pain with nausea and vomiting EXAM: ULTRASOUND ABDOMEN COMPLETE COMPARISON: CT scan of the abdomen and pelvis of October 18, 2011 FINDINGS: Gallbladder: The gallbladder is distended. There are multiple echogenic mobile shadowing stones. The gallbladder wall is mildly thickened at 3.7 mm. A trace of pericholecystic fluid is suspected. There is no positive sonographic Murphy's sign however. Common bile duct: Diameter: 8.3 mm which is mildly dilated. No abnormal intraluminal echoes are observed. Liver: No focal lesion identified. Within normal limits in parenchymal echogenicity. IVC: No abnormality visualized. Pancreas: Evaluation of the pancreas was quite limited due to bowel gas. Spleen: Size and appearance within normal limits. Right Kidney: Length: 11.9 cm. Echogenicity within normal limits. No mass or hydronephrosis visualized. Left Kidney: Length: 12.4 cm. Echogenicity within normal limits. No mass or hydronephrosis visualized. Abdominal aorta: No aneurysm visualized. Other findings: There is no ascites. IMPRESSION: 1. Distended gallbladder containing multiple stones. There is minimal wall thickening and a trace of pericholecystic fluid the findings suggest acute cholecystitis with cholelithiasis. There is no positive sonographic Murphy's sign however. Mild common bile duct dilation is observed without evidence of intraluminal stones. 2. No acute abnormality is demonstrated elsewhere  within the abdomen. Evaluation of pancreas is limited by bowel gas. 3. These results will be called to the ordering clinician or representative by the Radiologist Assistant, and communication documented in the PACS or zVision Dashboard. Electronically Signed By: Jermaine Moody M.D. On: 05/13/2015 10:16 .  The patient is a 64 year old male who presents for evaluation of gall stones. The onset of the gall stones has been gradual and has been occurring in an intermittent pattern for 2 hours. The course has been increasing. The gall stones is described as moderate to severe. There has been associated abdominal pain, anorexia, colicky pain and nausea. Precipitating factors include eating. There are no relieving factors. Other Problems Jermaine Moody, CMA; 05/20/2015 10:26 AM) High blood pressure  Past Surgical History Jermaine Moody, CMA; 05/20/2015 10:26 AM) Resection of Stomach  Diagnostic Studies History Jermaine Moody, CMA; 05/20/2015 10:26 AM) Colonoscopy 5-10 years ago  Allergies Jermaine Moody, CMA; 05/20/2015 10:27 AM) No Known Drug Allergies 05/20/2015  Medication History (Jermaine Moody, CMA; 05/20/2015 10:28 AM) AmLODIPine Besylate (5MG  Tablet, Oral) Active. Hydrochlorothiazide (12.5MG  Capsule, Oral) Active. Advil (200MG  Capsule, Oral as needed) Active. Medications Reconciled  Social History Jermaine Moody, CMA; 05/20/2015 10:26 AM) Alcohol use Remotely quit alcohol use. Caffeine use Carbonated beverages, Coffee, Tea. Illicit drug use Remotely quit drug use. Tobacco use Former smoker.  Family History Jermaine Moody, CMA; 05/20/2015 10:26 AM) Arthritis Mother. Cerebrovascular Accident Brother. Colon Cancer Father. Depression Brother, Father. Heart Disease Brother, Father, Mother. Heart disease in male family member before age 84 Heart disease in male family member before age 30 Hypertension Brother, Mother.     Review of Systems Jermaine Moody CMA; 05/20/2015 10:27  AM) General Not Present- Appetite Loss, Chills, Fatigue, Fever, Night Sweats, Weight Gain and Weight Loss. Skin Not Present- Change in Wart/Mole, Dryness, Hives, Jaundice, New Lesions, Non-Healing  Wounds, Rash and Ulcer. HEENT Present- Seasonal Allergies and Wears glasses/contact lenses. Not Present- Earache, Hearing Loss, Hoarseness, Nose Bleed, Oral Ulcers, Ringing in the Ears, Sinus Pain, Sore Throat, Visual Disturbances and Yellow Eyes. Respiratory Not Present- Bloody sputum, Chronic Cough, Difficulty Breathing, Snoring and Wheezing. Breast Not Present- Breast Mass, Breast Pain, Nipple Discharge and Skin Changes. Cardiovascular Present- Shortness of Breath. Not Present- Chest Pain, Difficulty Breathing Lying Down, Leg Cramps, Palpitations, Rapid Heart Rate and Swelling of Extremities. Gastrointestinal Present- Change in Bowel Habits, Indigestion, Nausea and Vomiting. Not Present- Abdominal Pain, Bloating, Bloody Stool, Chronic diarrhea, Constipation, Difficulty Swallowing, Excessive gas, Gets full quickly at meals, Hemorrhoids and Rectal Pain. Male Genitourinary Not Present- Blood in Urine, Change in Urinary Stream, Frequency, Impotence, Nocturia, Painful Urination, Urgency and Urine Leakage. Musculoskeletal Present- Joint Stiffness. Not Present- Back Pain, Joint Pain, Muscle Pain, Muscle Weakness and Swelling of Extremities. Neurological Not Present- Decreased Memory, Fainting, Headaches, Numbness, Seizures, Tingling, Tremor, Trouble walking and Weakness. Psychiatric Present- Anxiety, Bipolar and Depression. Not Present- Change in Sleep Pattern, Fearful and Frequent crying. Endocrine Not Present- Cold Intolerance, Excessive Hunger, Hair Changes, Heat Intolerance, Hot flashes and New Diabetes. Hematology Not Present- Easy Bruising, Excessive bleeding, Gland problems, HIV and Persistent Infections.  Vitals (Jermaine Moody CMA; 05/20/2015 10:27 AM) 05/20/2015 10:27 AM Weight: 222.6 lb Height:  69in Body Surface Area: 2.22 m Body Mass Index: 32.87 kg/m Temp.: 97.23F(Temporal)  Pulse: 79 (Regular)  BP: 132/74 (Sitting, Left Arm, Standard)     Physical Exam (Jermaine Zabawa A. Nikea Settle MD; 05/20/2015 10:52 AM)  General Mental Status-Alert. General Appearance-Consistent with stated age. Hydration-Well hydrated. Voice-Normal.  Head and Neck Head-normocephalic, atraumatic with no lesions or palpable masses.  Eye Eyeball - Bilateral-Extraocular movements intact. Sclera/Conjunctiva - Bilateral-No scleral icterus.  Chest and Lung Exam Chest and lung exam reveals -quiet, even and easy respiratory effort with no use of accessory muscles and on auscultation, normal breath sounds, no adventitious sounds and normal vocal resonance. Inspection Chest Wall - Normal. Back - normal.  Cardiovascular Cardiovascular examination reveals -on palpation PMI is normal in location and amplitude, no palpable S3 or S4. Normal cardiac borders., normal heart sounds, regular rate and rhythm with no murmurs, carotid auscultation reveals no bruits and normal pedal pulses bilaterally.  Abdomen Inspection Inspection of the abdomen reveals - No Hernias. Skin - Scar - no surgical scars. Note: midline scar. Palpation/Percussion Palpation and Percussion of the abdomen reveal - Soft, Non Tender, No Rebound tenderness, No Rigidity (guarding) and No hepatosplenomegaly. Auscultation Auscultation of the abdomen reveals - Bowel sounds normal.   Neurologic Neurologic evaluation reveals -alert and oriented x 3 with no impairment of recent or remote memory. Mental Status-Normal.  Musculoskeletal Normal Exam - Left-Upper Extremity Strength Normal and Lower Extremity Strength Normal. Normal Exam - Right-Upper Extremity Strength Normal, Lower Extremity Weakness.    Assessment & Plan (Aaryan Essman A. Shoua Ulloa MD; 05/20/2015 10:50 AM)  SYMPTOMATIC CHOLELITHIASIS (574.20   K80.20) Impression: discussed medical and surgical management of gallstones. he desires laparoscopic cholecystectomy but due to previuos laparotomy may need open approach which I discussed with him. The procedure has been discussed with the patient. Risks of laparoscopic cholecystectomy include bleeding, infection, bile duct injury, leak, death, open surgery, diarrhea, other surgery, organ injury, blood vessel injury, DVT, and additional care.  Current Plans Pt Education - Laparoscopic Cholecystectomy: gallbladder Pt Education - Gallstones: discussed with patient and provided information. Pt Education - Patient information: Gallstones (The Basics): discussed with patient and provided information.

## 2015-07-01 ENCOUNTER — Telehealth: Payer: Self-pay | Admitting: Lab

## 2015-07-01 NOTE — Telephone Encounter (Signed)
Patient was scheduled for labs on 07/05/15 and will be given an appmt with Dr Linus Salmons.

## 2015-07-05 ENCOUNTER — Other Ambulatory Visit: Payer: 59

## 2015-07-05 DIAGNOSIS — B182 Chronic viral hepatitis C: Secondary | ICD-10-CM

## 2015-07-05 LAB — IRON: Iron: 170 ug/dL — ABNORMAL HIGH (ref 42–165)

## 2015-07-06 LAB — PROTIME-INR
INR: 0.95 (ref <1.50)
Prothrombin Time: 12.7 seconds (ref 11.6–15.2)

## 2015-07-06 LAB — HEPATITIS B CORE ANTIBODY, TOTAL: Hep B Core Total Ab: NONREACTIVE

## 2015-07-06 LAB — HEPATITIS A ANTIBODY, TOTAL: HEP A TOTAL AB: NONREACTIVE

## 2015-07-06 LAB — ANA: Anti Nuclear Antibody(ANA): NEGATIVE

## 2015-07-06 LAB — HIV ANTIBODY (ROUTINE TESTING W REFLEX): HIV 1&2 Ab, 4th Generation: NONREACTIVE

## 2015-07-06 NOTE — Telephone Encounter (Signed)
Patient is scheduled to see Dr. Linus Salmons on 09/06/2015 @ 1100-He is having surgery on 07/12/15-wants to have some recovery time.

## 2015-07-08 LAB — HEPATITIS C GENOTYPE

## 2015-07-12 ENCOUNTER — Encounter (HOSPITAL_COMMUNITY): Payer: Self-pay

## 2015-07-12 ENCOUNTER — Encounter (HOSPITAL_COMMUNITY)
Admission: RE | Admit: 2015-07-12 | Discharge: 2015-07-12 | Disposition: A | Discharge status: Home / Self Care | Payer: 59 | Source: Ambulatory Visit | Attending: Surgery | Admitting: Surgery

## 2015-07-12 DIAGNOSIS — K8020 Calculus of gallbladder without cholecystitis without obstruction: Secondary | ICD-10-CM | POA: Diagnosis not present

## 2015-07-12 DIAGNOSIS — Z01812 Encounter for preprocedural laboratory examination: Secondary | ICD-10-CM | POA: Insufficient documentation

## 2015-07-12 DIAGNOSIS — I10 Essential (primary) hypertension: Secondary | ICD-10-CM | POA: Diagnosis not present

## 2015-07-12 DIAGNOSIS — I493 Ventricular premature depolarization: Secondary | ICD-10-CM | POA: Insufficient documentation

## 2015-07-12 HISTORY — DX: Unspecified viral hepatitis C without hepatic coma: B19.20

## 2015-07-12 LAB — CBC WITH DIFFERENTIAL/PLATELET
BASOS ABS: 0.1 10*3/uL (ref 0.0–0.1)
BASOS PCT: 1 % (ref 0–1)
EOS ABS: 0.2 10*3/uL (ref 0.0–0.7)
Eosinophils Relative: 2 % (ref 0–5)
HEMATOCRIT: 43.6 % (ref 39.0–52.0)
HEMOGLOBIN: 14.9 g/dL (ref 13.0–17.0)
LYMPHS ABS: 2.6 10*3/uL (ref 0.7–4.0)
LYMPHS PCT: 30 % (ref 12–46)
MCH: 29 pg (ref 26.0–34.0)
MCHC: 34.2 g/dL (ref 30.0–36.0)
MCV: 84.8 fL (ref 78.0–100.0)
MONOS PCT: 10 % (ref 3–12)
Monocytes Absolute: 0.9 10*3/uL (ref 0.1–1.0)
NEUTROS ABS: 4.9 10*3/uL (ref 1.7–7.7)
Neutrophils Relative %: 57 % (ref 43–77)
Platelets: 145 10*3/uL — ABNORMAL LOW (ref 150–400)
RBC: 5.14 MIL/uL (ref 4.22–5.81)
RDW: 14.7 % (ref 11.5–15.5)
WBC: 8.7 10*3/uL (ref 4.0–10.5)

## 2015-07-12 LAB — COMPREHENSIVE METABOLIC PANEL
ALBUMIN: 3.3 g/dL — AB (ref 3.5–5.0)
ALK PHOS: 113 U/L (ref 38–126)
ALT: 56 U/L (ref 17–63)
ANION GAP: 9 (ref 5–15)
AST: 45 U/L — ABNORMAL HIGH (ref 15–41)
BILIRUBIN TOTAL: 0.5 mg/dL (ref 0.3–1.2)
BUN: 11 mg/dL (ref 6–20)
CALCIUM: 9.1 mg/dL (ref 8.9–10.3)
CO2: 25 mmol/L (ref 22–32)
Chloride: 105 mmol/L (ref 101–111)
Creatinine, Ser: 0.96 mg/dL (ref 0.61–1.24)
GFR calc non Af Amer: >60 mL/min (ref >60)
GLUCOSE: 122 mg/dL — AB (ref 65–99)
POTASSIUM: 3.8 mmol/L (ref 3.5–5.1)
Sodium: 139 mmol/L (ref 135–145)
TOTAL PROTEIN: 7.2 g/dL (ref 6.5–8.1)

## 2015-07-12 LAB — NO BLOOD PRODUCTS

## 2015-07-12 NOTE — Pre-Procedure Instructions (Signed)
Jermaine Moody  07/12/2015      Your procedure is scheduled on September 8  Report to [facility] Admitting at 5:30 A.M.  Call this number if you have problems the morning of surgery:  [phone]   Remember:  Do not eat food or drink liquids after midnight.  Take these medicines the morning of surgery with A SIP OF WATER None   STOP Ibuprofen September 1   STOP/ Do not take Aspirin, Aleve, Naproxen, Advil, Ibuprofen, Motrin, Vitamins, Herbs, or Supplements starting September 1   Do not wear jewelry, make-up or nail polish.  Do not wear lotions, powders, or perfumes.  You may wear deodorant.  Do not shave 48 hours prior to surgery.  Men may shave face and neck.  Do not bring valuables to the hospital.  [facility] is not responsible for any belongings or valuables.  Contacts, dentures or bridgework may not be worn into surgery.  Leave your suitcase in the car.  After surgery it may be brought to your room.  For patients admitted to the hospital, discharge time will be determined by your treatment team.  Patients discharged the day of surgery will not be allowed to drive home.   [facility] - Preparing for Surgery  Before surgery, you can play an important role.  Because skin is not sterile, your skin needs to be as free of germs as possible.  You can reduce the number of germs on you skin by washing with CHG (chlorahexidine gluconate) soap before surgery.  CHG is an antiseptic cleaner which kills germs and bonds with the skin to continue killing germs even after washing.  Please DO NOT use if you have an allergy to CHG or antibacterial soaps.  If your skin becomes reddened/irritated stop using the CHG and inform your nurse when you arrive at Short Stay.  Do not shave (including legs and underarms) for at least 48 hours prior to the first CHG shower.  You may shave your face.  Please follow these instructions carefully:   1.  Shower with CHG Soap the night  before surgery and the morning of Surgery.  2.  If you choose to wash your hair, wash your hair first as usual with your normal shampoo.  3.  After you shampoo, rinse your hair and body thoroughly to remove the shampoo.  4.  Use CHG as you would any other liquid soap.  You can apply CHG directly to the skin and wash gently with scrungie or a clean washcloth.  5.  Apply the CHG Soap to your body ONLY FROM THE NECK DOWN.  Do not use on open wounds or open sores.  Avoid contact with your eyes, ears, mouth and genitals (private parts).  Wash genitals (private parts) with your normal soap.  6.  Wash thoroughly, paying special attention to the area where your surgery will be performed.  7.  Thoroughly rinse your body with warm water from the neck down.  8.  DO NOT shower/wash with your normal soap after using and rinsing off the CHG Soap.  9.  Pat yourself dry with a clean towel.            10.  Wear clean pajamas.            11.  Place clean sheets on your bed the night of your first shower and do not sleep with pets.  Day of Surgery  Do not apply any lotions the  morning of surgery.  Please wear clean clothes to the hospital/surgery center.    Please read over the following fact sheets that you were given. Pain Booklet, Coughing and Deep Breathing and Surgical Site Infection Prevention

## 2015-07-12 NOTE — Progress Notes (Signed)
   07/12/15 1222  OBSTRUCTIVE SLEEP APNEA  Have you ever been diagnosed with sleep apnea through a sleep study? No  Do you snore loudly (loud enough to be heard through closed doors)?  0  Do you often feel tired, fatigued, or sleepy during the daytime? 0  Has anyone observed you stop breathing during your sleep? 0  Do you have, or are you being treated for high blood pressure? 1  BMI more than 35 kg/m2? 0  Age over 63 years old? 1  Neck circumference greater than 40 cm/16 inches? 1  Gender: 1  Obstructive Sleep Apnea Score 4

## 2015-07-12 NOTE — Progress Notes (Signed)
Patient denies CP, Shob, Cardiologist, Cardiac Test. PCP Iona Beard at [facility]

## 2015-07-13 ENCOUNTER — Other Ambulatory Visit (HOSPITAL_COMMUNITY): Payer: 59

## 2015-07-21 ENCOUNTER — Inpatient Hospital Stay (HOSPITAL_COMMUNITY)
Admission: AD | Admit: 2015-07-21 | Discharge: 2015-07-26 | DRG: 416 | Disposition: A | Discharge status: Home / Self Care | Payer: 59 | Source: Ambulatory Visit | Attending: Surgery | Admitting: Surgery

## 2015-07-21 ENCOUNTER — Ambulatory Visit (HOSPITAL_COMMUNITY): Payer: 59 | Admitting: Anesthesiology

## 2015-07-21 ENCOUNTER — Encounter (HOSPITAL_COMMUNITY)
Admission: AD | Disposition: A | Discharge status: Home / Self Care | Payer: Self-pay | Source: Ambulatory Visit | Attending: Surgery

## 2015-07-21 ENCOUNTER — Encounter (HOSPITAL_COMMUNITY): Payer: Self-pay | Admitting: *Deleted

## 2015-07-21 DIAGNOSIS — Z79899 Other long term (current) drug therapy: Secondary | ICD-10-CM

## 2015-07-21 DIAGNOSIS — K5900 Constipation, unspecified: Secondary | ICD-10-CM | POA: Diagnosis not present

## 2015-07-21 DIAGNOSIS — I10 Essential (primary) hypertension: Secondary | ICD-10-CM | POA: Diagnosis present

## 2015-07-21 DIAGNOSIS — K8010 Calculus of gallbladder with chronic cholecystitis without obstruction: Principal | ICD-10-CM | POA: Diagnosis present

## 2015-07-21 DIAGNOSIS — R109 Unspecified abdominal pain: Secondary | ICD-10-CM | POA: Diagnosis present

## 2015-07-21 DIAGNOSIS — Z87891 Personal history of nicotine dependence: Secondary | ICD-10-CM | POA: Diagnosis not present

## 2015-07-21 DIAGNOSIS — K567 Ileus, unspecified: Secondary | ICD-10-CM

## 2015-07-21 HISTORY — PX: CHOLECYSTECTOMY OPEN: SUR202

## 2015-07-21 HISTORY — DX: Other seasonal allergic rhinitis: J30.2

## 2015-07-21 HISTORY — DX: Personal history of other medical treatment: Z92.89

## 2015-07-21 HISTORY — PX: CHOLECYSTECTOMY: SHX55

## 2015-07-21 LAB — CBC
HCT: 40.1 % (ref 39.0–52.0)
Hemoglobin: 13.5 g/dL (ref 13.0–17.0)
MCH: 29 pg (ref 26.0–34.0)
MCHC: 33.7 g/dL (ref 30.0–36.0)
MCV: 86.1 fL (ref 78.0–100.0)
PLATELETS: 123 10*3/uL — AB (ref 150–400)
RBC: 4.66 MIL/uL (ref 4.22–5.81)
RDW: 14.7 % (ref 11.5–15.5)
WBC: 11.8 10*3/uL — AB (ref 4.0–10.5)

## 2015-07-21 LAB — CREATININE, SERUM
Creatinine, Ser: 1 mg/dL (ref 0.61–1.24)
GFR calc Af Amer: >60 mL/min
GFR calc non Af Amer: >60 mL/min

## 2015-07-21 SURGERY — LAPAROSCOPIC CHOLECYSTECTOMY WITH INTRAOPERATIVE CHOLANGIOGRAM
Anesthesia: General | Site: Abdomen

## 2015-07-21 MED ORDER — SIMETHICONE 80 MG PO CHEW
40.0000 mg | CHEWABLE_TABLET | Freq: Four times a day (QID) | ORAL | Status: DC | PRN
  Administered 2015-07-23: 40 mg via ORAL
  Filled 2015-07-21: qty 1

## 2015-07-21 MED ORDER — HYDROMORPHONE HCL 1 MG/ML IJ SOLN
INTRAMUSCULAR | Status: AC
  Administered 2015-07-21: 0.5 mg via INTRAVENOUS
  Filled 2015-07-21: qty 1

## 2015-07-21 MED ORDER — 0.9 % SODIUM CHLORIDE (POUR BTL) OPTIME
TOPICAL | Status: DC | PRN
  Administered 2015-07-21 (×3): 1000 mL

## 2015-07-21 MED ORDER — MAGNESIUM CITRATE PO SOLN: 1.0000 | Freq: Once | ORAL | Status: DC | PRN

## 2015-07-21 MED ORDER — LIDOCAINE HCL (CARDIAC) 20 MG/ML IV SOLN
INTRAVENOUS | Status: DC | PRN
  Administered 2015-07-21: 100 mg via INTRAVENOUS

## 2015-07-21 MED ORDER — PANTOPRAZOLE SODIUM 40 MG IV SOLR
40.0000 mg | Freq: Every day | INTRAVENOUS | Status: DC
Start: 2015-07-21 — End: 2015-07-22
  Administered 2015-07-21: 40 mg via INTRAVENOUS
  Filled 2015-07-21: qty 40

## 2015-07-21 MED ORDER — LACTATED RINGERS IV SOLN
INTRAVENOUS | Status: DC | PRN
  Administered 2015-07-21 (×2): via INTRAVENOUS

## 2015-07-21 MED ORDER — DIPHENHYDRAMINE HCL 12.5 MG/5ML PO ELIX: 12.5000 mg | ORAL_SOLUTION | Freq: Four times a day (QID) | ORAL | Status: DC | PRN

## 2015-07-21 MED ORDER — LIDOCAINE HCL (CARDIAC) 20 MG/ML IV SOLN
INTRAVENOUS | Status: AC
  Filled 2015-07-21: qty 5

## 2015-07-21 MED ORDER — PROPOFOL 10 MG/ML IV BOLUS
INTRAVENOUS | Status: DC | PRN
  Administered 2015-07-21: 180 mg via INTRAVENOUS

## 2015-07-21 MED ORDER — GLYCOPYRROLATE 0.2 MG/ML IJ SOLN
INTRAMUSCULAR | Status: DC | PRN
  Administered 2015-07-21: .8 mg via INTRAVENOUS

## 2015-07-21 MED ORDER — ROCURONIUM BROMIDE 50 MG/5ML IV SOLN
INTRAVENOUS | Status: AC
  Filled 2015-07-21: qty 1

## 2015-07-21 MED ORDER — OXYCODONE HCL 5 MG PO TABS
5.0000 mg | ORAL_TABLET | ORAL | Status: DC | PRN
  Administered 2015-07-21 – 2015-07-26 (×16): 10 mg via ORAL
  Administered 2015-07-26: 5 mg via ORAL
  Filled 2015-07-21 (×11): qty 2
  Filled 2015-07-21: qty 1
  Filled 2015-07-21 (×5): qty 2

## 2015-07-21 MED ORDER — ACETAMINOPHEN 650 MG RE SUPP: 650.0000 mg | Freq: Four times a day (QID) | RECTAL | Status: DC | PRN

## 2015-07-21 MED ORDER — ZOLPIDEM TARTRATE 5 MG PO TABS: 5.0000 mg | ORAL_TABLET | Freq: Every evening | ORAL | Status: DC | PRN

## 2015-07-21 MED ORDER — PHENYLEPHRINE 40 MCG/ML (10ML) SYRINGE FOR IV PUSH (FOR BLOOD PRESSURE SUPPORT)
PREFILLED_SYRINGE | INTRAVENOUS | Status: AC
  Filled 2015-07-21: qty 10

## 2015-07-21 MED ORDER — SODIUM CHLORIDE 0.9 % IJ SOLN
INTRAMUSCULAR | Status: AC
  Filled 2015-07-21: qty 10

## 2015-07-21 MED ORDER — ONDANSETRON 4 MG PO TBDP
4.0000 mg | ORAL_TABLET | Freq: Four times a day (QID) | ORAL | Status: DC | PRN
  Administered 2015-07-25: 4 mg via ORAL
  Filled 2015-07-21: qty 1

## 2015-07-21 MED ORDER — DEXTROSE 5 % IV SOLN
3.0000 g | INTRAVENOUS | Status: AC
  Administered 2015-07-21: 3 g via INTRAVENOUS
  Filled 2015-07-21: qty 3000

## 2015-07-21 MED ORDER — DEXTROSE 5 % IV SOLN
2.0000 g | INTRAVENOUS | Status: DC
  Administered 2015-07-21 – 2015-07-25 (×5): 2 g via INTRAVENOUS
  Filled 2015-07-21 (×6): qty 2

## 2015-07-21 MED ORDER — ACETAMINOPHEN 325 MG PO TABS
650.0000 mg | ORAL_TABLET | Freq: Four times a day (QID) | ORAL | Status: DC | PRN
Start: 2015-07-21 — End: 2015-07-26
  Administered 2015-07-22: 650 mg via ORAL
  Filled 2015-07-21: qty 2

## 2015-07-21 MED ORDER — ONDANSETRON HCL 4 MG/2ML IJ SOLN: 4.0000 mg | Freq: Four times a day (QID) | INTRAMUSCULAR | Status: DC | PRN

## 2015-07-21 MED ORDER — INFLUENZA VAC SPLIT QUAD 0.5 ML IM SUSY
0.5000 mL | PREFILLED_SYRINGE | INTRAMUSCULAR | Status: AC
  Administered 2015-07-22: 0.5 mL via INTRAMUSCULAR
  Filled 2015-07-21: qty 0.5

## 2015-07-21 MED ORDER — DIPHENHYDRAMINE HCL 50 MG/ML IJ SOLN: 12.5000 mg | Freq: Four times a day (QID) | INTRAMUSCULAR | Status: DC | PRN

## 2015-07-21 MED ORDER — METHOCARBAMOL 500 MG PO TABS
500.0000 mg | ORAL_TABLET | Freq: Four times a day (QID) | ORAL | Status: DC | PRN
  Administered 2015-07-24 (×2): 500 mg via ORAL
  Filled 2015-07-21 (×2): qty 1

## 2015-07-21 MED ORDER — HYDROCHLOROTHIAZIDE 12.5 MG PO CAPS
12.5000 mg | ORAL_CAPSULE | Freq: Every day | ORAL | Status: DC
  Administered 2015-07-21 – 2015-07-25 (×5): 12.5 mg via ORAL
  Filled 2015-07-21 (×6): qty 1

## 2015-07-21 MED ORDER — NEOSTIGMINE METHYLSULFATE 10 MG/10ML IV SOLN
INTRAVENOUS | Status: DC | PRN
  Administered 2015-07-21: 5 mg via INTRAVENOUS

## 2015-07-21 MED ORDER — ONDANSETRON HCL 4 MG/2ML IJ SOLN
4.0000 mg | Freq: Four times a day (QID) | INTRAMUSCULAR | Status: DC | PRN
  Administered 2015-07-25 (×2): 4 mg via INTRAVENOUS
  Filled 2015-07-21 (×3): qty 2

## 2015-07-21 MED ORDER — ACETAMINOPHEN 10 MG/ML IV SOLN
1000.0000 mg | INTRAVENOUS | Status: AC
  Administered 2015-07-21: 1000 mg via INTRAVENOUS
  Filled 2015-07-21: qty 100

## 2015-07-21 MED ORDER — PROMETHAZINE HCL 25 MG/ML IJ SOLN
INTRAMUSCULAR | Status: AC
  Administered 2015-07-21: 6.25 mg via INTRAVENOUS
  Filled 2015-07-21: qty 1

## 2015-07-21 MED ORDER — FENTANYL CITRATE (PF) 250 MCG/5ML IJ SOLN
INTRAMUSCULAR | Status: AC
  Filled 2015-07-21: qty 5

## 2015-07-21 MED ORDER — SODIUM CHLORIDE 0.9 % IJ SOLN: 9.0000 mL | INTRAMUSCULAR | Status: DC | PRN

## 2015-07-21 MED ORDER — HYDROMORPHONE HCL 1 MG/ML IJ SOLN
0.2500 mg | INTRAMUSCULAR | Status: DC | PRN
  Administered 2015-07-21 (×4): 0.5 mg via INTRAVENOUS

## 2015-07-21 MED ORDER — ROCURONIUM BROMIDE 100 MG/10ML IV SOLN
INTRAVENOUS | Status: DC | PRN
  Administered 2015-07-21: 10 mg via INTRAVENOUS
  Administered 2015-07-21: 40 mg via INTRAVENOUS

## 2015-07-21 MED ORDER — EPHEDRINE SULFATE 50 MG/ML IJ SOLN
INTRAMUSCULAR | Status: DC | PRN
  Administered 2015-07-21: 15 mg via INTRAVENOUS
  Administered 2015-07-21: 10 mg via INTRAVENOUS

## 2015-07-21 MED ORDER — FENTANYL CITRATE (PF) 100 MCG/2ML IJ SOLN
INTRAMUSCULAR | Status: DC | PRN
  Administered 2015-07-21 (×2): 50 ug via INTRAVENOUS
  Administered 2015-07-21: 25 ug via INTRAVENOUS
  Administered 2015-07-21: 100 ug via INTRAVENOUS
  Administered 2015-07-21: 25 ug via INTRAVENOUS

## 2015-07-21 MED ORDER — NALOXONE HCL 0.4 MG/ML IJ SOLN: 0.4000 mg | INTRAMUSCULAR | Status: DC | PRN

## 2015-07-21 MED ORDER — PHENYLEPHRINE HCL 10 MG/ML IJ SOLN
INTRAMUSCULAR | Status: DC | PRN
  Administered 2015-07-21: 40 ug via INTRAVENOUS
  Administered 2015-07-21: 120 ug via INTRAVENOUS

## 2015-07-21 MED ORDER — MIDAZOLAM HCL 2 MG/2ML IJ SOLN
INTRAMUSCULAR | Status: AC
  Filled 2015-07-21: qty 4

## 2015-07-21 MED ORDER — HYDROMORPHONE 0.3 MG/ML IV SOLN
INTRAVENOUS | Status: DC
  Administered 2015-07-21 (×2): 1.5 mg via INTRAVENOUS
  Administered 2015-07-21: 11:00:00 via INTRAVENOUS
  Administered 2015-07-21: 1.2 mg via INTRAVENOUS
  Administered 2015-07-22: 0.9 mg via INTRAVENOUS
  Administered 2015-07-22: 1.8 mg via INTRAVENOUS
  Filled 2015-07-21: qty 25

## 2015-07-21 MED ORDER — BOOST / RESOURCE BREEZE PO LIQD
1.0000 | Freq: Three times a day (TID) | ORAL | Status: DC
  Administered 2015-07-21 – 2015-07-22 (×2): 1 via ORAL

## 2015-07-21 MED ORDER — HYDROMORPHONE 0.3 MG/ML IV SOLN
INTRAVENOUS | Status: AC
  Filled 2015-07-21: qty 25

## 2015-07-21 MED ORDER — PROPOFOL 10 MG/ML IV BOLUS
INTRAVENOUS | Status: AC
  Filled 2015-07-21: qty 20

## 2015-07-21 MED ORDER — DEXTROSE-NACL 5-0.9 % IV SOLN
INTRAVENOUS | Status: DC
  Administered 2015-07-21 – 2015-07-25 (×2): via INTRAVENOUS

## 2015-07-21 MED ORDER — PROMETHAZINE HCL 25 MG/ML IJ SOLN
6.2500 mg | INTRAMUSCULAR | Status: DC | PRN
  Administered 2015-07-21: 6.25 mg via INTRAVENOUS

## 2015-07-21 MED ORDER — BISACODYL 5 MG PO TBEC
5.0000 mg | DELAYED_RELEASE_TABLET | Freq: Every day | ORAL | Status: DC | PRN
  Administered 2015-07-23: 5 mg via ORAL
  Filled 2015-07-21 (×2): qty 1

## 2015-07-21 MED ORDER — ALBUMIN HUMAN 5 % IV SOLN
INTRAVENOUS | Status: DC | PRN
  Administered 2015-07-21: 09:00:00 via INTRAVENOUS

## 2015-07-21 MED ORDER — BENAZEPRIL HCL 20 MG PO TABS
20.0000 mg | ORAL_TABLET | Freq: Every day | ORAL | Status: DC
  Administered 2015-07-21 – 2015-07-25 (×5): 20 mg via ORAL
  Filled 2015-07-21 (×8): qty 1

## 2015-07-21 MED ORDER — NEOSTIGMINE METHYLSULFATE 10 MG/10ML IV SOLN
INTRAVENOUS | Status: AC
  Filled 2015-07-21: qty 1

## 2015-07-21 MED ORDER — MIDAZOLAM HCL 5 MG/5ML IJ SOLN
INTRAMUSCULAR | Status: DC | PRN
  Administered 2015-07-21: 2 mg via INTRAVENOUS

## 2015-07-21 MED ORDER — EPHEDRINE SULFATE 50 MG/ML IJ SOLN
INTRAMUSCULAR | Status: AC
  Filled 2015-07-21: qty 1

## 2015-07-21 MED ORDER — CHLORHEXIDINE GLUCONATE 4 % EX LIQD: 1.0000 "application " | Freq: Once | CUTANEOUS | Status: DC

## 2015-07-21 MED ORDER — ONDANSETRON HCL 4 MG/2ML IJ SOLN
INTRAMUSCULAR | Status: AC
  Filled 2015-07-21: qty 2

## 2015-07-21 MED ORDER — BUPIVACAINE-EPINEPHRINE 0.25% -1:200000 IJ SOLN
INTRAMUSCULAR | Status: DC | PRN
  Administered 2015-07-21: 3 mL

## 2015-07-21 MED ORDER — ENOXAPARIN SODIUM 40 MG/0.4ML ~~LOC~~ SOLN
40.0000 mg | SUBCUTANEOUS | Status: DC
  Administered 2015-07-22 – 2015-07-26 (×5): 40 mg via SUBCUTANEOUS
  Filled 2015-07-21 (×4): qty 0.4

## 2015-07-21 MED ORDER — OXYCODONE HCL 5 MG PO TABS
ORAL_TABLET | ORAL | Status: AC
  Administered 2015-07-21: 10 mg via ORAL
  Filled 2015-07-21: qty 2

## 2015-07-21 MED ORDER — BUPIVACAINE-EPINEPHRINE (PF) 0.25% -1:200000 IJ SOLN
INTRAMUSCULAR | Status: AC
  Filled 2015-07-21: qty 30

## 2015-07-21 MED ORDER — SODIUM CHLORIDE 0.9 % IR SOLN
Status: DC | PRN
  Administered 2015-07-21: 1000 mL

## 2015-07-21 MED ORDER — ONDANSETRON HCL 4 MG/2ML IJ SOLN
INTRAMUSCULAR | Status: DC | PRN
  Administered 2015-07-21: 4 mg via INTRAVENOUS

## 2015-07-21 SURGICAL SUPPLY — 65 items
APPLIER CLIP ROT 10 11.4 M/L (STAPLE) ×3
BLADE SURG 10 STRL SS (BLADE) ×3 IMPLANT
BLADE SURG ROTATE 9660 (MISCELLANEOUS) ×3 IMPLANT
CANISTER SUCTION 2500CC (MISCELLANEOUS) ×3 IMPLANT
CHLORAPREP W/TINT 26ML (MISCELLANEOUS) ×3 IMPLANT
CLIP APPLIE ROT 10 11.4 M/L (STAPLE) ×1 IMPLANT
COVER MAYO STAND STRL (DRAPES) ×3 IMPLANT
COVER SURGICAL LIGHT HANDLE (MISCELLANEOUS) ×3 IMPLANT
DRAIN CHANNEL 19F RND (DRAIN) ×3 IMPLANT
DRAPE C-ARM 42X72 X-RAY (DRAPES) ×3 IMPLANT
DRAPE WARM FLUID 44X44 (DRAPE) ×3 IMPLANT
DRSG COVADERM 4X14 (GAUZE/BANDAGES/DRESSINGS) ×3 IMPLANT
DRSG OPSITE POSTOP 4X10 (GAUZE/BANDAGES/DRESSINGS) ×3 IMPLANT
ELECT BLADE 6.5 EXT (BLADE) ×3 IMPLANT
ELECT CAUTERY BLADE 6.4 (BLADE) ×3 IMPLANT
ELECT REM PT RETURN 9FT ADLT (ELECTROSURGICAL) ×3
ELECTRODE REM PT RTRN 9FT ADLT (ELECTROSURGICAL) ×1 IMPLANT
EVACUATOR SILICONE 100CC (DRAIN) ×3 IMPLANT
GAUZE SPONGE 4X4 12PLY STRL (GAUZE/BANDAGES/DRESSINGS) ×3 IMPLANT
GLOVE BIO SURGEON STRL SZ 6.5 (GLOVE) ×4 IMPLANT
GLOVE BIO SURGEON STRL SZ7 (GLOVE) ×3 IMPLANT
GLOVE BIO SURGEON STRL SZ8 (GLOVE) ×3 IMPLANT
GLOVE BIO SURGEONS STRL SZ 6.5 (GLOVE) ×2
GLOVE BIOGEL PI IND STRL 6 (GLOVE) ×1 IMPLANT
GLOVE BIOGEL PI IND STRL 7.0 (GLOVE) ×3 IMPLANT
GLOVE BIOGEL PI IND STRL 8 (GLOVE) ×1 IMPLANT
GLOVE BIOGEL PI INDICATOR 6 (GLOVE) ×2
GLOVE BIOGEL PI INDICATOR 7.0 (GLOVE) ×6
GLOVE BIOGEL PI INDICATOR 8 (GLOVE) ×2
GOWN STRL REUS W/ TWL LRG LVL3 (GOWN DISPOSABLE) ×2 IMPLANT
GOWN STRL REUS W/ TWL XL LVL3 (GOWN DISPOSABLE) ×1 IMPLANT
GOWN STRL REUS W/TWL 2XL LVL3 (GOWN DISPOSABLE) ×3 IMPLANT
GOWN STRL REUS W/TWL LRG LVL3 (GOWN DISPOSABLE) ×4
GOWN STRL REUS W/TWL XL LVL3 (GOWN DISPOSABLE) ×2
KIT BASIN OR (CUSTOM PROCEDURE TRAY) ×3 IMPLANT
KIT ROOM TURNOVER OR (KITS) ×3 IMPLANT
LIQUID BAND (GAUZE/BANDAGES/DRESSINGS) ×3 IMPLANT
NS IRRIG 1000ML POUR BTL (IV SOLUTION) ×9 IMPLANT
OPSITE POST-OP VISIBLE 11 3/4 IN X 4 IN ×3 IMPLANT
PAD ARMBOARD 7.5X6 YLW CONV (MISCELLANEOUS) ×3 IMPLANT
PENCIL BUTTON HOLSTER BLD 10FT (ELECTRODE) ×3 IMPLANT
POUCH SPECIMEN RETRIEVAL 10MM (ENDOMECHANICALS) ×3 IMPLANT
SCISSORS LAP 5X35 DISP (ENDOMECHANICALS) ×3 IMPLANT
SET CHOLANGIOGRAPH 5 50 .035 (SET/KITS/TRAYS/PACK) ×3 IMPLANT
SET IRRIG TUBING LAPAROSCOPIC (IRRIGATION / IRRIGATOR) ×3 IMPLANT
SLEEVE ENDOPATH XCEL 5M (ENDOMECHANICALS) ×3 IMPLANT
SPECIMEN JAR SMALL (MISCELLANEOUS) ×3 IMPLANT
SPONGE LAP 18X18 X RAY DECT (DISPOSABLE) ×6 IMPLANT
STAPLER VISISTAT 35W (STAPLE) ×3 IMPLANT
SUT ETHILON 2 0 FS 18 (SUTURE) ×3 IMPLANT
SUT MNCRL AB 4-0 PS2 18 (SUTURE) ×3 IMPLANT
SUT PDS AB 1 CTX 36 (SUTURE) ×12 IMPLANT
SUT VIC AB 3-0 SH 18 (SUTURE) ×6 IMPLANT
TAPE CLOTH SOFT 2X10 (GAUZE/BANDAGES/DRESSINGS) ×3 IMPLANT
TAPE CLOTH SURG 6X10 WHT LF (GAUZE/BANDAGES/DRESSINGS) ×3 IMPLANT
TOWEL OR 17X24 6PK STRL BLUE (TOWEL DISPOSABLE) ×3 IMPLANT
TOWEL OR 17X26 10 PK STRL BLUE (TOWEL DISPOSABLE) ×3 IMPLANT
TRAY LAPAROSCOPIC MC (CUSTOM PROCEDURE TRAY) ×3 IMPLANT
TROCAR XCEL BLUNT TIP 100MML (ENDOMECHANICALS) IMPLANT
TROCAR XCEL NON-BLD 11X100MML (ENDOMECHANICALS) ×6 IMPLANT
TROCAR XCEL NON-BLD 5MMX100MML (ENDOMECHANICALS) ×3 IMPLANT
TUBE CONNECTING 12'X1/4 (SUCTIONS) ×1
TUBE CONNECTING 12X1/4 (SUCTIONS) ×2 IMPLANT
TUBING INSUFFLATION (TUBING) ×3 IMPLANT
YANKAUER SUCT BULB TIP NO VENT (SUCTIONS) ×6 IMPLANT

## 2015-07-21 NOTE — Op Note (Signed)
Preoperative diagnosis: Chronic cholecystitis  with gall with  Postoperative diagnosis: Same  Procedure: Attempted laparoscopic converted to open cholecystectomy  Surgeon: Erroll Luna M.D.  Asst.: Dr. Marcello Moores M.D.  EBL: 100 mL  Drains: 63 French round drain to gallbladder fossa  Anesthesia: Gen. endotracheal anesthesia with 0.25% Sensorcaine local  Specimens: Gallbladder fragments to pathology was stones  Indications for procedure: The patient is a 64 year old male who is chronic abdominal pain since 2012 with nausea vomiting after fatty meals. 2012 he was diagnosed with gallstones and gallbladder disease by ultrasound. He opted not to have surgery at that time. His symptoms come and go and are 3-4 times year. This year he said more severe attacks desired treatment. He's had a previous open abdominal procedure and is not sure exactly what that was many years ago. We discussed cholecystectomy potential need for open cholecystectomy. We discussed attempted laparoscopic cholecystectomy and possible cholangiogram. The procedure has been discussed with the patient. Operative and non operative treatments have been discussed. Risks of surgery include bleeding, infection,  Common bile duct injury,  Injury to the stomach,liver, colon,small intestine, abdominal wall,  Diaphragm,  Major blood vessels,  And the need for an open procedure.  Other risks include worsening of medical problems, death,  DVT and pulmonary embolism, and cardiovascular events.   Medical options have also been discussed. The patient has been informed of long term expectations of surgery and non surgical options,  The patient agrees to proceed.    Description of procedure: Patient was met in the holding area and questions are answered. He is taken back to the operating room placed upon the OR table. After induction of general endotracheal anesthesia, the abdomen was prepped and draped in sterile fashion and timeout was done. He  received 2 g of Ancef. He was midline incision out placed 5 mm Optiview port placed left midabdomen. This was done local anesthesia scope was passed to the abdominal wall with the help port under direct vision. The abdominal cavity without injury. Insufflation to 15 mmHg of CO2 pressure was achieved. He begins upper abdominal adhesions omentum transverse colon incision abdominal wall, liver, gallbladder and stomach region. Placed 11 mm port in the umbilicus and a 5 mm port in the right. Began to take the omentum off the anterior abdominal wall and transverse colon. This was extremely begins very difficult. After spending approximately 20 minutes trying to do this, I felt conversion to open at this point necessary. He also had severely chronically inflamed gallbladder was densely adherent to the anterior abdominal wall. I removed all of our ports allowing the CO2 to escape.  A transverse right upper quadrant incision was made. Dissection was carried down to the subcutaneous fat abdominal wall layers. A retractor was placed. Transverse colon was examined. There is using the transverse colon from  lysis of adhesions . This was oversewn with 3-0 Vicryl. The colon was carefully examined and there is no evidence of any full-thickness injury to the transverse colon in this area. After placement of retraction, the gallbladder was identified and there is severe chronic inflammation between the gallbladder, anterior abdominal wall duodenum. Carefully dissected out the gallbladder off the anterior abdominal wall. He has severe chronic inflammation. Carefully dissected the gallbladder away from the duodenum without injury. The gallbladder was extremely stiff and not easily manipulated. The infundibulum was stuck down onto the porta hepatis. Did not feel was safe to dissect this area. The gallbladder was then dissected away in a dome down fashion from  the liver. This was done until the infundibulum was encountered the  gallbladder was amputated there. This left a rim of infundibulum. This was oversewn with 2-0 Vicryl. There is no evidence of bile leakage. Porta hepatis was chronically inflamed and any attempt at dissection in this area would carry a significant risk of injury. Irrigation was used and suctioned out. Any stone material was suctioned and removed. Through separate stab incision and 52 French round drain was placed in the gallbladder fossa. 2-0 nylon used secured to the skin. All counts are found to be correct prior to closing. The colon was reexamined and there is no signs of any full-thickness injury. The duodenum was reexamined and there is no evidence of full-thickness injury. Abdominal wall was closed in layers with #1 PDS. Staples used to close skin. Port site closed with staples. All final counts are found to be correct. Patient was awoke, extubated taken to recovery in satisfactory condition.

## 2015-07-21 NOTE — Anesthesia Procedure Notes (Signed)
Procedure Name: Intubation Date/Time: 07/21/2015 7:45 AM Performed by: Williemae Area B Pre-anesthesia Checklist: Patient identified, Patient being monitored, Timeout performed, Emergency Drugs available and Suction available Patient Re-evaluated:Patient Re-evaluated prior to inductionOxygen Delivery Method: Circle system utilized Preoxygenation: Pre-oxygenation with 100% oxygen Intubation Type: IV induction Ventilation: Mask ventilation without difficulty Laryngoscope Size: Miller and 2 Grade View: Grade I Tube type: Oral Tube size: 7.5 mm Number of attempts: 1 Airway Equipment and Method: Stylet Placement Confirmation: ETT inserted through vocal cords under direct vision,  positive ETCO2 and breath sounds checked- equal and bilateral Secured at: 23 cm Tube secured with: Tape Dental Injury: Teeth and Oropharynx as per pre-operative assessment

## 2015-07-21 NOTE — H&P (Signed)
H&P   Jermaine Moody (MR# [id number])      H&P Info    Chief Strategy Officer Note Status Last Update User Last Update Date/Time   Erroll Luna, MD Signed Erroll Luna, MD 05/20/2015 10:55 AM    H&P    Expand All Collapse All   [name] 05/20/2015 10:26 AM Location: [facility] Patient #: [id number] DOB: 04/24/1951 Married / Language: English / Race: Black or African American Male History of Present Illness Jermaine Moody A. Leomia Blake MD; 05/20/2015 10:55 AM) Patient words: gallbladder  Pt sent at the request of Dr Criss Rosales for episodic epigastric and RUQ abdominal pain for years. Attacks happen 3 - 4 times a year the last being severe. U/S shows numerous gallstones.        CLINICAL DATA: Right upper quadrant pain with nausea and vomiting EXAM: ULTRASOUND ABDOMEN COMPLETE COMPARISON: CT scan of the abdomen and pelvis of October 18, 2011 FINDINGS: Gallbladder: The gallbladder is distended. There are multiple echogenic mobile shadowing stones. The gallbladder wall is mildly thickened at 3.7 mm. A trace of pericholecystic fluid is suspected. There is no positive sonographic Murphy's sign however. Common bile duct: Diameter: 8.3 mm which is mildly dilated. No abnormal intraluminal echoes are observed. Liver: No focal lesion identified. Within normal limits in parenchymal echogenicity. IVC: No abnormality visualized. Pancreas: Evaluation of the pancreas was quite limited due to bowel gas. Spleen: Size and appearance within normal limits. Right Kidney: Length: 11.9 cm. Echogenicity within normal limits. No mass or hydronephrosis visualized. Left Kidney: Length: 12.4 cm. Echogenicity within normal limits. No mass or hydronephrosis visualized. Abdominal aorta: No aneurysm visualized. Other findings: There is no ascites. IMPRESSION: 1. Distended gallbladder containing multiple stones. There is minimal wall thickening and a trace of pericholecystic fluid the findings suggest  acute cholecystitis with cholelithiasis. There is no positive sonographic Murphy's sign however. Mild common bile duct dilation is observed without evidence of intraluminal stones. 2. No acute abnormality is demonstrated elsewhere within the abdomen. Evaluation of pancreas is limited by bowel gas. 3. These results will be called to the ordering clinician or representative by the Radiologist Assistant, and communication documented in the PACS or zVision Dashboard. Electronically Signed By: David Martinique M.D. On: 05/13/2015 10:16 .  The patient is a 64 year old male who presents for evaluation of gall stones. The onset of the gall stones has been gradual and has been occurring in an intermittent pattern for 2 hours. The course has been increasing. The gall stones is described as moderate to severe. There has been associated abdominal pain, anorexia, colicky pain and nausea. Precipitating factors include eating. There are no relieving factors. Other Problems Jermaine Moody, CMA; 05/20/2015 10:26 AM) High blood pressure  Past Surgical History Jermaine Moody, CMA; 05/20/2015 10:26 AM) Resection of Stomach  Diagnostic Studies History Jermaine Moody, CMA; 05/20/2015 10:26 AM) Colonoscopy 5-10 years ago  Allergies Jermaine Moody, CMA; 05/20/2015 10:27 AM) No Known Drug Allergies 05/20/2015  Medication History (Sonya Moody, CMA; 05/20/2015 10:28 AM) AmLODIPine Besylate (5MG  Tablet, Oral) Active. Hydrochlorothiazide (12.5MG  Capsule, Oral) Active. Advil (200MG  Capsule, Oral as needed) Active. Medications Reconciled  Social History Jermaine Moody, CMA; 05/20/2015 10:26 AM) Alcohol use Remotely quit alcohol use. Caffeine use Carbonated beverages, Coffee, Tea. Illicit drug use Remotely quit drug use. Tobacco use Former smoker.  Family History Jermaine Moody, CMA; 05/20/2015 10:26 AM) Arthritis Mother. Cerebrovascular Accident Brother. Colon Cancer Father. Depression Brother, Father. Heart Disease  Brother, Father, Mother. Heart disease in male family member before age 18  Heart disease in male family member before age 23 Hypertension Brother, Mother.     Review of Systems Jermaine Moody CMA; 05/20/2015 10:27 AM) General Not Present- Appetite Loss, Chills, Fatigue, Fever, Night Sweats, Weight Gain and Weight Loss. Skin Not Present- Change in Wart/Mole, Dryness, Hives, Jaundice, New Lesions, Non-Healing Wounds, Rash and Ulcer. HEENT Present- Seasonal Allergies and Wears glasses/contact lenses. Not Present- Earache, Hearing Loss, Hoarseness, Nose Bleed, Oral Ulcers, Ringing in the Ears, Sinus Pain, Sore Throat, Visual Disturbances and Yellow Eyes. Respiratory Not Present- Bloody sputum, Chronic Cough, Difficulty Breathing, Snoring and Wheezing. Breast Not Present- Breast Mass, Breast Pain, Nipple Discharge and Skin Changes. Cardiovascular Present- Shortness of Breath. Not Present- Chest Pain, Difficulty Breathing Lying Down, Leg Cramps, Palpitations, Rapid Heart Rate and Swelling of Extremities. Gastrointestinal Present- Change in Bowel Habits, Indigestion, Nausea and Vomiting. Not Present- Abdominal Pain, Bloating, Bloody Stool, Chronic diarrhea, Constipation, Difficulty Swallowing, Excessive gas, Gets full quickly at meals, Hemorrhoids and Rectal Pain. Male Genitourinary Not Present- Blood in Urine, Change in Urinary Stream, Frequency, Impotence, Nocturia, Painful Urination, Urgency and Urine Leakage. Musculoskeletal Present- Joint Stiffness. Not Present- Back Pain, Joint Pain, Muscle Pain, Muscle Weakness and Swelling of Extremities. Neurological Not Present- Decreased Memory, Fainting, Headaches, Numbness, Seizures, Tingling, Tremor, Trouble walking and Weakness. Psychiatric Present- Anxiety, Bipolar and Depression. Not Present- Change in Sleep Pattern, Fearful and Frequent crying. Endocrine Not Present- Cold Intolerance, Excessive Hunger, Hair Changes, Heat Intolerance, Hot flashes and  New Diabetes. Hematology Not Present- Easy Bruising, Excessive bleeding, Gland problems, HIV and Persistent Infections.  Vitals (Sonya Moody CMA; 05/20/2015 10:27 AM) 05/20/2015 10:27 AM Weight: 222.6 lb Height: 69in Body Surface Area: 2.22 m Body Mass Index: 32.87 kg/m Temp.: 97.67F(Temporal)  Pulse: 79 (Regular)  BP: 132/74 (Sitting, Left Arm, Standard)     Physical Exam (Margretta Zamorano A. Janayia Burggraf MD; 05/20/2015 10:52 AM)  General Mental Status-Alert. General Appearance-Consistent with stated age. Hydration-Well hydrated. Voice-Normal.  Head and Neck Head-normocephalic, atraumatic with no lesions or palpable masses.  Eye Eyeball - Bilateral-Extraocular movements intact. Sclera/Conjunctiva - Bilateral-No scleral icterus.  Chest and Lung Exam Chest and lung exam reveals -quiet, even and easy respiratory effort with no use of accessory muscles and on auscultation, normal breath sounds, no adventitious sounds and normal vocal resonance. Inspection Chest Wall - Normal. Back - normal.  Cardiovascular Cardiovascular examination reveals -on palpation PMI is normal in location and amplitude, no palpable S3 or S4. Normal cardiac borders., normal heart sounds, regular rate and rhythm with no murmurs, carotid auscultation reveals no bruits and normal pedal pulses bilaterally.  Abdomen Inspection Inspection of the abdomen reveals - No Hernias. Skin - Note: midline scar. Palpation/Percussion Palpation and Percussion of the abdomen reveal - Soft, Non Tender, No Rebound tenderness, No Rigidity (guarding) and No hepatosplenomegaly. Auscultation Auscultation of the abdomen reveals - Bowel sounds normal.   Neurologic Neurologic evaluation reveals -alert and oriented x 3 with no impairment of recent or remote memory. Mental Status-Normal.  Musculoskeletal Normal Exam - Left-Upper Extremity Strength Normal and Lower Extremity Strength Normal. Normal Exam -  Right-Upper Extremity Strength Normal, Lower Extremity Weakness.    Assessment & Plan (Kaisha Wachob A. Kadarious Dikes MD; 05/20/2015 10:50 AM)  SYMPTOMATIC CHOLELITHIASIS (574.20  K80.20) Impression: discussed medical and surgical management of gallstones. he desires laparoscopic cholecystectomy but due to previuos laparotomy may need open approach which I discussed with him. The procedure has been discussed with the patient. Risks of laparoscopic cholecystectomy include bleeding, infection, bile duct injury, leak, death, open surgery, diarrhea,  other surgery, organ injury, blood vessel injury, DVT, and additional care.  Current Plans Pt Education - Laparoscopic Cholecystectomy: gallbladder Pt Education - Gallstones: discussed with patient and provided information. Pt Education - Patient information: Gallstones (The Basics): discussed with patient and provided information.

## 2015-07-21 NOTE — Interval H&P Note (Signed)
History and Physical Interval Note:  07/21/2015 7:17 AM  Jermaine Moody  has presented today for surgery, with the diagnosis of Gallstones  The various methods of treatment have been discussed with the patient and family. After consideration of risks, benefits and other options for treatment, the patient has consented to  Procedure(s): LAPAROSCOPIC CHOLECYSTECTOMY WITH INTRAOPERATIVE CHOLANGIOGRAM (N/A) as a surgical intervention .  The patient's history has been reviewed, patient examined, no change in status, stable for surgery.  I have reviewed the patient's chart and labs.  Questions were answered to the patient's satisfaction.     Albertus Chiarelli A.

## 2015-07-21 NOTE — Anesthesia Preprocedure Evaluation (Addendum)
Anesthesia Evaluation  Patient identified by MRN, date of birth, ID band Patient awake    Reviewed: Allergy & Precautions, NPO status , Patient's Chart, lab work & pertinent test results, reviewed documented beta blocker date and time   Airway Mallampati: I  TM Distance: >3 FB Neck ROM: Full    Dental  (+) Teeth Intact, Dental Advisory Given   Pulmonary former smoker,    breath sounds clear to auscultation       Cardiovascular hypertension, Pt. on medications  Rhythm:Regular     Neuro/Psych    GI/Hepatic negative GI ROS, (+) Hepatitis -  Endo/Other    Renal/GU negative Renal ROS     Musculoskeletal   Abdominal   Peds  Hematology   Anesthesia Other Findings   Reproductive/Obstetrics                            Anesthesia Physical Anesthesia Plan  ASA: III  Anesthesia Plan: General   Post-op Pain Management:    Induction: Intravenous  Airway Management Planned: Oral ETT  Additional Equipment:   Intra-op Plan:   Post-operative Plan: Extubation in OR  Informed Consent: I have reviewed the patients History and Physical, chart, labs and discussed the procedure including the risks, benefits and alternatives for the proposed anesthesia with the patient or authorized representative who has indicated his/her understanding and acceptance.   Dental advisory given  Plan Discussed with: CRNA and Anesthesiologist  Anesthesia Plan Comments:         Anesthesia Quick Evaluation

## 2015-07-21 NOTE — Anesthesia Postprocedure Evaluation (Signed)
  Anesthesia Post-op Note  Patient: Jermaine Moody  Procedure(s) Performed: Procedure(s): ATTEMPTED LAPAROSCOPIC CHOLECYSTECTOMY CONVERTED TO OPEN  (N/A)  Patient Location: PACU  Anesthesia Type:General  Level of Consciousness: awake  Airway and Oxygen Therapy: Patient Spontanous Breathing  Post-op Pain: mild  Post-op Assessment: Post-op Vital signs reviewed              Post-op Vital Signs: Reviewed  Last Vitals:  Filed Vitals:   07/21/15 1000  BP: 137/76  Pulse: 78  Temp: 36.6 C  Resp: 19    Complications: No apparent anesthesia complications

## 2015-07-21 NOTE — Transfer of Care (Signed)
Immediate Anesthesia Transfer of Care Note  Patient: Jermaine Moody  Procedure(s) Performed: Procedure(s): ATTEMPTED LAPAROSCOPIC CHOLECYSTECTOMY CONVERTED TO OPEN  (N/A)  Patient Location: PACU  Anesthesia Type:General  Level of Consciousness: awake, alert  and patient cooperative  Airway & Oxygen Therapy: Patient Spontanous Breathing and Patient connected to nasal cannula oxygen  Post-op Assessment: Report given to RN, Post -op Vital signs reviewed and stable and Patient moving all extremities  Post vital signs: Reviewed and stable  Last Vitals:  Filed Vitals:   07/21/15 0551  BP: 117/78  Pulse: 77  Temp: 36.8 C  Resp: 20    Complications: No apparent anesthesia complications

## 2015-07-22 ENCOUNTER — Encounter (HOSPITAL_COMMUNITY): Payer: Self-pay | Admitting: Surgery

## 2015-07-22 LAB — CBC
HEMATOCRIT: 38.1 % — AB (ref 39.0–52.0)
HEMOGLOBIN: 12.8 g/dL — AB (ref 13.0–17.0)
MCH: 29.4 pg (ref 26.0–34.0)
MCHC: 33.6 g/dL (ref 30.0–36.0)
MCV: 87.6 fL (ref 78.0–100.0)
Platelets: 112 10*3/uL — ABNORMAL LOW (ref 150–400)
RBC: 4.35 MIL/uL (ref 4.22–5.81)
RDW: 15.1 % (ref 11.5–15.5)
WBC: 11.4 10*3/uL — ABNORMAL HIGH (ref 4.0–10.5)

## 2015-07-22 LAB — BASIC METABOLIC PANEL
Anion gap: 8 (ref 5–15)
BUN: 7 mg/dL (ref 6–20)
CHLORIDE: 98 mmol/L — AB (ref 101–111)
CO2: 28 mmol/L (ref 22–32)
CREATININE: 0.94 mg/dL (ref 0.61–1.24)
Calcium: 8.2 mg/dL — ABNORMAL LOW (ref 8.9–10.3)
GFR calc Af Amer: >60 mL/min (ref >60)
GFR calc non Af Amer: >60 mL/min (ref >60)
GLUCOSE: 120 mg/dL — AB (ref 65–99)
Potassium: 3.7 mmol/L (ref 3.5–5.1)
SODIUM: 134 mmol/L — AB (ref 135–145)

## 2015-07-22 MED ORDER — HYDROMORPHONE HCL 1 MG/ML IJ SOLN: 1.0000 mg | INTRAMUSCULAR | Status: DC | PRN

## 2015-07-22 MED ORDER — PANTOPRAZOLE SODIUM 40 MG PO TBEC
40.0000 mg | DELAYED_RELEASE_TABLET | Freq: Every day | ORAL | Status: DC
  Administered 2015-07-22 – 2015-07-25 (×4): 40 mg via ORAL
  Filled 2015-07-22 (×4): qty 1

## 2015-07-22 MED ORDER — ENSURE ENLIVE PO LIQD
237.0000 mL | Freq: Two times a day (BID) | ORAL | Status: DC
  Administered 2015-07-22 – 2015-07-25 (×5): 237 mL via ORAL

## 2015-07-22 NOTE — Discharge Instructions (Signed)
[facility]      [facility] °[phone] ° °OPEN ABDOMINAL SURGERY: POST OP INSTRUCTIONS ° °Always review your discharge instruction sheet given to you by the facility where your surgery was performed. ° °IF YOU HAVE DISABILITY OR FAMILY LEAVE FORMS, YOU MUST BRING THEM TO THE OFFICE FOR PROCESSING.  PLEASE DO NOT GIVE THEM TO YOUR DOCTOR. ° °1. A prescription for pain medication may be given to you upon discharge.  Take your pain medication as prescribed, if needed.  If narcotic pain medicine is not needed, then you may take acetaminophen (Tylenol) or ibuprofen (Advil) as needed. °2. Take your usually prescribed medications unless otherwise directed. °3. If you need a refill on your pain medication, please contact your pharmacy. They will contact our office to request authorization.  Prescriptions will not be filled after 5pm or on week-ends. °4. You should follow a light diet the first few days after arrival home, such as soup and crackers, pudding, etc.unless your doctor has advised otherwise. A high-fiber, low fat diet can be resumed as tolerated.   Be sure to include lots of fluids daily. Most patients will experience some swelling and bruising on the chest and neck area.  Ice packs will help.  Swelling and bruising can take several days to resolve °5. Most patients will experience some swelling and bruising in the area of the incision. Ice pack will help. Swelling and bruising can take several days to resolve..  °6. It is common to experience some constipation if taking pain medication after surgery.  Increasing fluid intake and taking a stool softener will usually help or prevent this problem from occurring.  A mild laxative (Milk of Magnesia or Miralax) should be taken according to package directions if there are no bowel movements after 48 hours. °7.  You may have steri-strips (small skin tapes) in place directly over the incision.  These strips should be left on the skin for 7-10 days.  If your  surgeon used skin glue on the incision, you may shower in 24 hours.  The glue will flake off over the next 2-3 weeks.  Any sutures or staples will be removed at the office during your follow-up visit. You may find that a light gauze bandage over your incision may keep your staples from being rubbed or pulled. You may shower and replace the bandage daily. °8. ACTIVITIES:  You may resume regular (light) daily activities beginning the next day--such as daily self-care, walking, climbing stairs--gradually increasing activities as tolerated.  You may have sexual intercourse when it is comfortable.  Refrain from any heavy lifting or straining until approved by your doctor. °a. You may drive when you no longer are taking prescription pain medication, you can comfortably wear a seatbelt, and you can safely maneuver your car and apply brakes °b. Return to Work: ___________________________________ °9. You should see your doctor in the office for a follow-up appointment approximately two weeks after your surgery.  Make sure that you call for this appointment within a day or two after you arrive home to insure a convenient appointment time. °OTHER INSTRUCTIONS:  °_____________________________________________________________ °_____________________________________________________________ ° °WHEN TO CALL YOUR DOCTOR: °1. Fever over 101.0 °2. Inability to urinate °3. Nausea and/or vomiting °4. Extreme swelling or bruising °5. Continued bleeding from incision. °6. Increased pain, redness, or drainage from the incision. °7. Difficulty swallowing or breathing °8. Muscle cramping or spasms. °9. Numbness or tingling in hands or feet or around lips. ° °The clinic staff is available to   answer your questions during regular business hours.  Please don’t hesitate to call and ask to speak to one of the nurses if you have concerns. ° °For further questions, please visit [email or web address] ° °Bulb Drain Home Care °A bulb drain  consists of a thin rubber tube and a soft, round bulb that creates a gentle suction. The rubber tube is placed in the area where you had surgery. A bulb is attached to the end of the tube that is outside the body. The bulb drain removes excess fluid that normally builds up in a surgical wound after surgery. The color and amount of fluid will vary. Immediately after surgery, the fluid is bright red and is a little thicker than water. It may gradually change to a yellow or pink color and become more thin and water-like. When the amount decreases to about 1 or 2 tbsp in 24 hours, your health care provider will usually remove it. °DAILY CARE °· Keep the bulb flat (compressed) at all times, except while emptying it. The flatness creates suction. You can flatten the bulb by squeezing it firmly in the middle and then closing the cap. °· Keep sites where the tube enters the skin dry and covered with a bandage (dressing). °· Secure the tube 1-2 in (2.5-5.1 cm) below the insertion sites to keep it from pulling on your stitches. The tube is stitched in place and will not slip out. °· Secure the bulb as directed by your health care provider. °· For the first 3 days after surgery, there usually is more fluid in the bulb. Empty the bulb whenever it becomes half full because the bulb does not create enough suction if it is too full. The bulb could also overflow. Write down how much fluid you remove each time you empty your drain. Add up the amount removed in 24 hours. °· Empty the bulb at the same time every day once the amount of fluid decreases and you only need to empty it once a day. Write down the amounts and the 24-hour totals to give to your health care provider. This helps your health care provider know when the tubes can be removed. °EMPTYING THE BULB DRAIN °Before emptying the bulb, get a measuring cup, a piece of paper and a pen, and wash your hands. °· Gently run your fingers down the tube (stripping) to empty any  drainage from the tubing into the bulb. This may need to be done several times a day to clear the tubing of clots and tissue. °· Open the bulb cap to release suction, which causes it to inflate. Do not touch the inside of the cap. °· Gently run your fingers down the tube (stripping) to empty any drainage from the tubing into the bulb. °· Hold the cap out of the way, and pour fluid into the measuring cup.   °· Squeeze the bulb to provide suction.  °· Replace the cap.   °· Check the tape that holds the tube to your skin. If it is becoming loose, you can remove the loose piece of tape and apply a new one. Then, pin the bulb to your shirt.   °· Write down the amount of fluid you emptied out. Write down the date and each time you emptied your bulb drain. (If there are 2 bulbs, note the amount of drainage from each bulb and keep the totals separate. Your health care provider will want to know the total amounts for each drain and which tube is draining more.)   °·   Flush the fluid down the toilet and wash your hands.   °· Call your health care provider once you have less than 2 tbsp of fluid collecting in the bulb drain every 24 hours. °If there is drainage around the tube site, change dressings and keep the area dry. Cleanse around tube with sterile saline and place dry gauze around site. This gauze should be changed when it is soiled. If it stays clean and unsoiled, it should still be changed daily.  °SEEK MEDICAL CARE IF: °· Your drainage has a bad smell or is cloudy.   °· You have a fever.   °· Your drainage is increasing instead of decreasing.   °· Your tube fell out.   °· You have redness or swelling around the tube site.   °· You have drainage from a surgical wound.   °· Your bulb drain will not stay flat after you empty it.   °MAKE SURE YOU:  °· Understand these instructions. °· Will watch your condition. °· Will get help right away if you are not doing well or get worse. °Document Released: 10/26/2000 Document  Revised: 03/15/2014 Document Reviewed: 04/02/2012 °Exit[employer]® Patient Information ©2015 [employer]. This information is not intended to replace advice given to you by your health care provider. Make sure you discuss any questions you have with your health care provider. ° °

## 2015-07-22 NOTE — Progress Notes (Signed)
1 Day Post-Op  Subjective: Doing well no complaints  Objective: Vital signs in last 24 hours: Temp:  [97.6 F (36.4 C)-98.1 F (36.7 C)] 98.1 F (36.7 C) (09/09 0517) Pulse Rate:  [66-83] 77 (09/09 0517) Resp:  [11-21] 20 (09/09 0800) BP: (113-137)/(64-79) 121/64 mmHg (09/09 0517) SpO2:  [94 %-100 %] 94 % (09/09 0800) Weight:  [102 kg (224 lb 13.9 oz)] 102 kg (224 lb 13.9 oz) (09/08 1230) Last BM Date: 07/20/15  Intake/Output from previous day: 09/08 0701 - 09/09 0700 In: 2450 [I.V.:2200; IV Piggyback:250] Out: 227 [Drains:127; Blood:100] Intake/Output this shift:    Incision/Wound:dressing in place minimal drainage port sites ok   Lab Results:   Recent Labs  07/21/15 1520 07/22/15 0411  WBC 11.8* 11.4*  HGB 13.5 12.8*  HCT 40.1 38.1*  PLT 123* 112*   BMET  Recent Labs  07/21/15 1520 07/22/15 0411  NA  --  134*  K  --  3.7  CL  --  98*  CO2  --  28  GLUCOSE  --  120*  BUN  --  7  CREATININE 1.00 0.94  CALCIUM  --  8.2*   PT/INR No results for input(s): LABPROT, INR in the last 72 hours. ABG No results for input(s): PHART, HCO3 in the last 72 hours.  Invalid input(s): PCO2, PO2  Studies/Results: No results found.  Anti-infectives: Anti-infectives    Start     Dose/Rate Route Frequency Ordered Stop   07/21/15 1300  cefTRIAXone (ROCEPHIN) 2 g in dextrose 5 % 50 mL IVPB     2 g 100 mL/hr over 30 Minutes Intravenous Every 24 hours 07/21/15 1255     07/21/15 0615  ceFAZolin (ANCEF) 3 g in dextrose 5 % 50 mL IVPB     3 g 160 mL/hr over 30 Minutes Intravenous To Short Stay 07/21/15 0555 07/21/15 0748      Assessment/Plan: s/p Procedure(s): ATTEMPTED LAPAROSCOPIC CHOLECYSTECTOMY CONVERTED TO OPEN  (N/A) Advance diet Home Saturday Will keep drain at discharge Follow  Up 10 days   LOS: 1 day    Darrill Vreeland A. 07/22/2015

## 2015-07-22 NOTE — Progress Notes (Signed)
Initial Nutrition Assessment  DOCUMENTATION CODES:   Not applicable  INTERVENTION:  Ensure Enlive po BID, each supplement provides 350 kcal and 20 grams of protein    NUTRITION DIAGNOSIS:   Inadequate oral intake related to inability to eat, poor appetite, other (see comment) (pain from gallstones) as evidenced by per patient/family report, other (see comment) (Pt reported 15# wt loss).    GOAL:   Patient will meet greater than or equal to 90% of their needs    MONITOR:   PO intake, I & O's, Skin, Labs  REASON FOR ASSESSMENT:   Malnutrition Screening Tool    ASSESSMENT:   Pt is a 64 yo male, presents with Gallstones, pmh of HTN, ED, Anxiety, Allergic Rhinitis; S/P Cholecystectomy. Pt reports dropping wt from 240-215 after getting sick previously and current bout with gallstones pre-cholecystectomy. We have current wt at 224#, still indicating an approximate wt loss of 15#/6% though I am unsure in what time span he lost this weight. Pt reports he attempted to take advantage of this wt loss, and worked out. Pt reported poor appetite due to gallstones, as the cause of this wt loss. NPFE found no indication of malnutrition, pt is well-nourished all around. Pt is currently  receiving boost breeze, will replace with EEx2 for healing post surgery.    Diet Order:  Diet Carb Modified Fluid consistency:: Thin; Room service appropriate?: Yes  Skin:  Wound (see comment) (Surgical incision to abdomen.)  Last BM:  07/20/2015  Height:   Ht Readings from Last 1 Encounters:  07/21/15 5\' 11"  (1.803 m)    Weight:   Wt Readings from Last 1 Encounters:  07/21/15 224 lb 13.9 oz (102 kg)    Ideal Body Weight:  78.18 kg  BMI:  Body mass index is 31.38 kg/(m^2).  Estimated Nutritional Needs:   Kcal:  2000-2200  Protein:  80-100g  Fluid:  >=/ 2L  EDUCATION NEEDS:   No education needs identified at this time  Jermaine Moody. Jermaine Martinek, MS, RD LDN After Hours/Weekend Pager  [phone]

## 2015-07-23 LAB — CBC
HCT: 38.9 % — ABNORMAL LOW (ref 39.0–52.0)
Hemoglobin: 13.1 g/dL (ref 13.0–17.0)
MCH: 29.4 pg (ref 26.0–34.0)
MCHC: 33.7 g/dL (ref 30.0–36.0)
MCV: 87.2 fL (ref 78.0–100.0)
PLATELETS: 108 10*3/uL — AB (ref 150–400)
RBC: 4.46 MIL/uL (ref 4.22–5.81)
RDW: 14.8 % (ref 11.5–15.5)
WBC: 15 10*3/uL — AB (ref 4.0–10.5)

## 2015-07-23 LAB — BASIC METABOLIC PANEL
ANION GAP: 10 (ref 5–15)
BUN: 6 mg/dL (ref 6–20)
CALCIUM: 8.8 mg/dL — AB (ref 8.9–10.3)
CO2: 28 mmol/L (ref 22–32)
Chloride: 98 mmol/L — ABNORMAL LOW (ref 101–111)
Creatinine, Ser: 0.97 mg/dL (ref 0.61–1.24)
GLUCOSE: 106 mg/dL — AB (ref 65–99)
Potassium: 3.8 mmol/L (ref 3.5–5.1)
SODIUM: 136 mmol/L (ref 135–145)

## 2015-07-23 NOTE — Progress Notes (Signed)
2 Days Post-Op  Subjective: Feels a little bloated today. Has not passed flatus per his report  Objective: Vital signs in last 24 hours: Temp:  [98.5 F (36.9 C)-99.8 F (37.7 C)] 98.5 F (36.9 C) (09/10 0550) Pulse Rate:  [80-92] 80 (09/10 0550) Resp:  [18-20] 18 (09/10 0550) BP: (100-123)/(63-84) 116/68 mmHg (09/10 0550) SpO2:  [88 %-99 %] 99 % (09/10 0550) Last BM Date: 07/20/15  Intake/Output from previous day: 09/09 0701 - 09/10 0700 In: 2480.9 [P.O.:600; I.V.:1780.9; IV Piggyback:100] Out: 35 [Drains:35] Intake/Output this shift:    Abdomen mildly full, minimally tender Drain serosang  Lab Results:   Recent Labs  07/22/15 0411 07/23/15 0350  WBC 11.4* 15.0*  HGB 12.8* 13.1  HCT 38.1* 38.9*  PLT 112* 108*   BMET  Recent Labs  07/22/15 0411 07/23/15 0350  NA 134* 136  K 3.7 3.8  CL 98* 98*  CO2 28 28  GLUCOSE 120* 106*  BUN 7 6  CREATININE 0.94 0.97  CALCIUM 8.2* 8.8*   PT/INR No results for input(s): LABPROT, INR in the last 72 hours. ABG No results for input(s): PHART, HCO3 in the last 72 hours.  Invalid input(s): PCO2, PO2  Studies/Results: No results found.  Anti-infectives: Anti-infectives    Start     Dose/Rate Route Frequency Ordered Stop   07/21/15 1300  cefTRIAXone (ROCEPHIN) 2 g in dextrose 5 % 50 mL IVPB     2 g 100 mL/hr over 30 Minutes Intravenous Every 24 hours 07/21/15 1255     07/21/15 0615  ceFAZolin (ANCEF) 3 g in dextrose 5 % 50 mL IVPB     3 g 160 mL/hr over 30 Minutes Intravenous To Short Stay 07/21/15 0555 07/21/15 0748      Assessment/Plan: s/p Procedure(s): ATTEMPTED LAPAROSCOPIC CHOLECYSTECTOMY CONVERTED TO OPEN  (N/A)  WBC has increased.  Will keep until tomorrow and repeat CBC ambulate  LOS: 2 days    Hayden Mabin A 07/23/2015

## 2015-07-24 LAB — CBC
HEMATOCRIT: 38.3 % — AB (ref 39.0–52.0)
HEMOGLOBIN: 12.8 g/dL — AB (ref 13.0–17.0)
MCH: 29.1 pg (ref 26.0–34.0)
MCHC: 33.4 g/dL (ref 30.0–36.0)
MCV: 87 fL (ref 78.0–100.0)
Platelets: 127 10*3/uL — ABNORMAL LOW (ref 150–400)
RBC: 4.4 MIL/uL (ref 4.22–5.81)
RDW: 14.4 % (ref 11.5–15.5)
WBC: 13.2 10*3/uL — AB (ref 4.0–10.5)

## 2015-07-24 MED ORDER — BISACODYL 10 MG RE SUPP
10.0000 mg | Freq: Once | RECTAL | Status: AC
  Administered 2015-07-24: 10 mg via RECTAL
  Filled 2015-07-24: qty 1

## 2015-07-24 NOTE — Progress Notes (Signed)
3 Days Post-Op  Subjective: Feels bloated and having a little nausea Has not passed any flatus  Objective: Vital signs in last 24 hours: Temp:  [98.3 F (36.8 C)-98.7 F (37.1 C)] 98.3 F (36.8 C) (09/10 2300) Pulse Rate:  [81-82] 82 (09/11 0605) Resp:  [18-20] 20 (09/11 0605) BP: (108-125)/(62-82) 111/76 mmHg (09/11 0605) SpO2:  [98 %-100 %] 98 % (09/11 0605) Last BM Date: 07/20/15  Intake/Output from previous day: 09/10 0701 - 09/11 0700 In: 970 [P.O.:920; IV Piggyback:50] Out: 25 [Drains:25] Intake/Output this shift:    Abdomen soft, incisions clean Drain serous  Lab Results:   Recent Labs  07/23/15 0350 07/24/15 0351  WBC 15.0* 13.2*  HGB 13.1 12.8*  HCT 38.9* 38.3*  PLT 108* 127*   BMET  Recent Labs  07/22/15 0411 07/23/15 0350  NA 134* 136  K 3.7 3.8  CL 98* 98*  CO2 28 28  GLUCOSE 120* 106*  BUN 7 6  CREATININE 0.94 0.97  CALCIUM 8.2* 8.8*   PT/INR No results for input(s): LABPROT, INR in the last 72 hours. ABG No results for input(s): PHART, HCO3 in the last 72 hours.  Invalid input(s): PCO2, PO2  Studies/Results: No results found.  Anti-infectives: Anti-infectives    Start     Dose/Rate Route Frequency Ordered Stop   07/21/15 1300  cefTRIAXone (ROCEPHIN) 2 g in dextrose 5 % 50 mL IVPB     2 g 100 mL/hr over 30 Minutes Intravenous Every 24 hours 07/21/15 1255     07/21/15 0615  ceFAZolin (ANCEF) 3 g in dextrose 5 % 50 mL IVPB     3 g 160 mL/hr over 30 Minutes Intravenous To Short Stay 07/21/15 0555 07/21/15 0748      Assessment/Plan: s/p Procedure(s): ATTEMPTED LAPAROSCOPIC CHOLECYSTECTOMY CONVERTED TO OPEN  (N/A)  Post op ileus  Will try dulcolax suppos WBC down today.  Will repeat tomorrow  LOS: 3 days    Tiffney Haughton A 07/24/2015

## 2015-07-25 ENCOUNTER — Inpatient Hospital Stay (HOSPITAL_COMMUNITY): Payer: 59

## 2015-07-25 LAB — COMPREHENSIVE METABOLIC PANEL
ALBUMIN: 2.9 g/dL — AB (ref 3.5–5.0)
ALK PHOS: 75 U/L (ref 38–126)
ALT: 47 U/L (ref 17–63)
ANION GAP: 10 (ref 5–15)
AST: 59 U/L — ABNORMAL HIGH (ref 15–41)
BUN: 8 mg/dL (ref 6–20)
CALCIUM: 8.9 mg/dL (ref 8.9–10.3)
CHLORIDE: 97 mmol/L — AB (ref 101–111)
CO2: 27 mmol/L (ref 22–32)
Creatinine, Ser: 0.78 mg/dL (ref 0.61–1.24)
GFR calc Af Amer: >60 mL/min (ref >60)
GFR calc non Af Amer: >60 mL/min (ref >60)
GLUCOSE: 133 mg/dL — AB (ref 65–99)
POTASSIUM: 3.5 mmol/L (ref 3.5–5.1)
SODIUM: 134 mmol/L — AB (ref 135–145)
Total Bilirubin: 0.6 mg/dL (ref 0.3–1.2)
Total Protein: 7 g/dL (ref 6.5–8.1)

## 2015-07-25 LAB — CBC
HCT: 35.6 % — ABNORMAL LOW (ref 39.0–52.0)
HEMOGLOBIN: 12.1 g/dL — AB (ref 13.0–17.0)
MCH: 29.1 pg (ref 26.0–34.0)
MCHC: 34 g/dL (ref 30.0–36.0)
MCV: 85.6 fL (ref 78.0–100.0)
PLATELETS: 135 10*3/uL — AB (ref 150–400)
RBC: 4.16 MIL/uL — ABNORMAL LOW (ref 4.22–5.81)
RDW: 14 % (ref 11.5–15.5)
WBC: 10.7 10*3/uL — ABNORMAL HIGH (ref 4.0–10.5)

## 2015-07-25 MED ORDER — SODIUM CHLORIDE 0.9 % IJ SOLN
3.0000 mL | Freq: Two times a day (BID) | INTRAMUSCULAR | Status: DC
  Administered 2015-07-25: 3 mL via INTRAVENOUS

## 2015-07-25 MED ORDER — BISACODYL 10 MG RE SUPP
10.0000 mg | Freq: Once | RECTAL | Status: AC
  Administered 2015-07-25: 10 mg via RECTAL
  Filled 2015-07-25: qty 1

## 2015-07-25 MED ORDER — SODIUM CHLORIDE 0.9 % IJ SOLN: 3.0000 mL | INTRAMUSCULAR | Status: DC | PRN

## 2015-07-25 MED ORDER — POLYETHYLENE GLYCOL 3350 17 GM/SCOOP PO POWD
1.0000 | Freq: Once | ORAL | Status: AC
  Administered 2015-07-25: 255 g via ORAL
  Filled 2015-07-25 (×2): qty 255

## 2015-07-25 MED ORDER — SODIUM CHLORIDE 0.9 % IV SOLN: 250.0000 mL | INTRAVENOUS | Status: DC | PRN

## 2015-07-25 NOTE — Progress Notes (Signed)
4 Days Post-Op  Subjective: Pt constipated  Objective: Vital signs in last 24 hours: Temp:  [98.2 F (36.8 C)-98.7 F (37.1 C)] 98.2 F (36.8 C) (09/12 0529) Pulse Rate:  [76-96] 87 (09/12 0529) Resp:  [18] 18 (09/12 0529) BP: (108-129)/(73-78) 108/78 mmHg (09/12 0529) SpO2:  [92 %-95 %] 92 % (09/12 0529) Last BM Date: 07/21/15  Intake/Output from previous day: 09/11 0701 - 09/12 0700 In: 1249 [P.O.:1022; I.V.:177; IV Piggyback:50] Out: 20 [Drains:20] Intake/Output this shift:    Incision/Wound:incision intact  Abdomen slightly distended he is tolerating his diet  JP SEROUS AND MINIMAL   Lab Results:   Recent Labs  07/24/15 0351 07/25/15 0018  WBC 13.2* 10.7*  HGB 12.8* 12.1*  HCT 38.3* 35.6*  PLT 127* 135*   BMET  Recent Labs  07/23/15 0350  NA 136  K 3.8  CL 98*  CO2 28  GLUCOSE 106*  BUN 6  CREATININE 0.97  CALCIUM 8.8*   PT/INR No results for input(s): LABPROT, INR in the last 72 hours. ABG No results for input(s): PHART, HCO3 in the last 72 hours.  Invalid input(s): PCO2, PO2  Studies/Results: No results found.  Anti-infectives: Anti-infectives    Start     Dose/Rate Route Frequency Ordered Stop   07/21/15 1300  cefTRIAXone (ROCEPHIN) 2 g in dextrose 5 % 50 mL IVPB     2 g 100 mL/hr over 30 Minutes Intravenous Every 24 hours 07/21/15 1255     07/21/15 0615  ceFAZolin (ANCEF) 3 g in dextrose 5 % 50 mL IVPB     3 g 160 mL/hr over 30 Minutes Intravenous To Short Stay 07/21/15 0555 07/21/15 0748      Assessment/Plan: s/p Procedure(s): ATTEMPTED LAPAROSCOPIC CHOLECYSTECTOMY CONVERTED TO OPEN  (N/A) Constipation  Add more miralax and ambulation  LOS: 4 days    Rudransh Bellanca A. 07/25/2015

## 2015-07-25 NOTE — Progress Notes (Addendum)
Patient had a large formed soft & hard brown stools after the dulcolax suppository was given and patient relieved of abdominal distention.

## 2015-07-26 MED ORDER — OXYCODONE HCL 5 MG PO TABS: 5.0000 mg | ORAL_TABLET | ORAL | Status: DC | PRN

## 2015-07-26 MED ORDER — ONDANSETRON 4 MG PO TBDP: 4.0000 mg | ORAL_TABLET | Freq: Four times a day (QID) | ORAL | Status: DC | PRN

## 2015-07-26 NOTE — Progress Notes (Signed)
5 Days Post-Op  Subjective: Bowels moving doing well   Objective: Vital signs in last 24 hours: Temp:  [97.9 F (36.6 C)-98.8 F (37.1 C)] 98.8 F (37.1 C) (09/13 0529) Pulse Rate:  [76-95] 82 (09/13 0529) Resp:  [18] 18 (09/13 0529) BP: (98-134)/(60-89) 98/60 mmHg (09/13 0529) SpO2:  [92 %-93 %] 92 % (09/13 0529) Last BM Date: 07/25/15  Intake/Output from previous day: 09/12 0701 - 09/13 0700 In: 2085 [P.O.:2082; I.V.:3] Out: 15 [Drains:15] Intake/Output this shift:    Incision/Wound:incision CDI  Soft abdomen   Lab Results:   Recent Labs  07/24/15 0351 07/25/15 0018  WBC 13.2* 10.7*  HGB 12.8* 12.1*  HCT 38.3* 35.6*  PLT 127* 135*   BMET  Recent Labs  07/25/15 0948  NA 134*  K 3.5  CL 97*  CO2 27  GLUCOSE 133*  BUN 8  CREATININE 0.78  CALCIUM 8.9   PT/INR No results for input(s): LABPROT, INR in the last 72 hours. ABG No results for input(s): PHART, HCO3 in the last 72 hours.  Invalid input(s): PCO2, PO2  Studies/Results: Dg Abd 1 View  07/26/2015   CLINICAL DATA:  64 year old male with lower abdominal pain an ileus.  EXAM: ABDOMEN - 1 VIEW  COMPARISON:  Ultrasound dated 05/13/2015 and CT dated 10/18/2011 caps  FINDINGS: The visualized lung bases are clear. No intra-abdominal free air identified.  There is an air distended loop of bowel in the stop midabdomen extending into the left upper quadrant, likely representing air distended transverse colon. An is healed dilated loop of bowel noted in the left lower quadrant measuring 4.3 cm in diameter.  Right upper quadrant cholecystectomy clips noted. A drainage catheter is noted in the right mid quadrant, likely at the cholecystectomy bed. Cutaneous staples noted in the right upper quadrant.  IMPRESSION: Mildly dilated loop of small bowel in the left lower abdomen likely representing an ileus versus small-bowel obstruction. Clinical correlation and follow-up recommended.   Electronically Signed   By: Anner Crete M.D.   On: 07/26/2015 01:18    Anti-infectives: Anti-infectives    Start     Dose/Rate Route Frequency Ordered Stop   07/21/15 1300  cefTRIAXone (ROCEPHIN) 2 g in dextrose 5 % 50 mL IVPB     2 g 100 mL/hr over 30 Minutes Intravenous Every 24 hours 07/21/15 1255     07/21/15 0615  ceFAZolin (ANCEF) 3 g in dextrose 5 % 50 mL IVPB     3 g 160 mL/hr over 30 Minutes Intravenous To Short Stay 07/21/15 0555 07/21/15 0748      Assessment/Plan: s/p Procedure(s): ATTEMPTED LAPAROSCOPIC CHOLECYSTECTOMY CONVERTED TO OPEN  (N/A) Discharge  LOS: 5 days    Jermaine Moody A. 07/26/2015

## 2015-07-26 NOTE — Discharge Summary (Signed)
Physician Discharge Summary  Patient ID: Jermaine Moody MRN: [id number] DOB/AGE: 12/28/1950 64 y.o.  Admit date: 07/21/2015 Discharge date: 07/26/2015  Admission Diagnoses:chronic cholecystitis Patient Active Problem List   Diagnosis Date Noted  . Chronic cholecystitis with calculus 07/21/2015  . Preventative health care 01/12/2011  . ERECTILE DYSFUNCTION, NON-ORGANIC 05/25/2009  . ANXIETY 12/29/2007  . HYPERTENSION 12/29/2007  . ALLERGIC RHINITIS 12/29/2007    Discharge Diagnoses:  Active Problems:   Chronic cholecystitis with calculus   Discharged Condition: good  Hospital Course: Pt developed ileus post op that resolved by day 5.  JP serous and WBC down to 10.9/  Pt tolerating diet ,  Ambulating and had good pain control.   Consults: None  Significant Diagnostic Studies: labs:  CBC    Component Value Date/Time   WBC 10.7* 07/25/2015 0018   RBC 4.16* 07/25/2015 0018   HGB 12.1* 07/25/2015 0018   HCT 35.6* 07/25/2015 0018   PLT 135* 07/25/2015 0018   MCV 85.6 07/25/2015 0018   MCH 29.1 07/25/2015 0018   MCHC 34.0 07/25/2015 0018   RDW 14.0 07/25/2015 0018   LYMPHSABS 2.6 07/12/2015 1246   MONOABS 0.9 07/12/2015 1246   EOSABS 0.2 07/12/2015 1246   BASOSABS 0.1 07/12/2015 1246     Treatments: surgery: open cholecystectomy  Discharge Exam: Blood pressure 98/60, pulse 82, temperature 98.8 F (37.1 C), temperature source Oral, resp. rate 18, height 5\' 11"  (1.803 m), weight 102 kg (224 lb 13.9 oz), SpO2 92 %. Incision/Wound:staples in place   JP SEROUS SOFT    NO REBOUND OR GUARDING   Disposition: Final discharge disposition not confirmed     Medication List    ASK your doctor about these medications        benazepril 20 MG tablet  Commonly known as:  LOTENSIN  Take 20 mg by mouth daily.     hydrochlorothiazide 12.5 MG capsule  Commonly known as:  MICROZIDE  TAKE 1 CAPSULE BY MOUTH DAILY.     ibuprofen 600 MG tablet  Commonly known as:  ADVIL,MOTRIN   Take 1 tablet (600 mg total) by mouth every 8 (eight) hours as needed.           Follow-up Information    Follow up with Suann Klier A., MD In 10 days.   Specialty:  General Surgery   Contact information:   [address] [place] [address] [phone]       Follow up with Varnell Orvis A., MD In 1 week.   Specialty:  General Surgery   Contact information:   [address] [place]  [phone]       Signed: Turner Daniels. 07/26/2015, 6:55 AM

## 2015-07-26 NOTE — Progress Notes (Signed)
Patient discharged at 1054 with volunteer services, explained discharge instructions. Pt verbalized and demonstrated understanding of instructions. No further questions at this time. Jasyn Mey, Dione Plover

## 2015-09-06 ENCOUNTER — Encounter: Payer: Self-pay | Admitting: Internal Medicine

## 2015-09-06 ENCOUNTER — Encounter: Payer: 59 | Admitting: Internal Medicine

## 2015-09-06 ENCOUNTER — Ambulatory Visit (INDEPENDENT_AMBULATORY_CARE_PROVIDER_SITE_OTHER): Payer: 59 | Admitting: Internal Medicine

## 2015-09-06 VITALS — BP 150/106 | HR 88 | Temp 98.1°F | Ht 71.0 in | Wt 216.0 lb

## 2015-09-06 DIAGNOSIS — Z23 Encounter for immunization: Secondary | ICD-10-CM | POA: Diagnosis not present

## 2015-09-06 DIAGNOSIS — B182 Chronic viral hepatitis C: Secondary | ICD-10-CM | POA: Insufficient documentation

## 2015-09-06 MED ORDER — LEDIPASVIR-SOFOSBUVIR 90-400 MG PO TABS: 1.0000 | ORAL_TABLET | Freq: Every day | ORAL | Status: DC

## 2015-09-06 NOTE — Progress Notes (Signed)
[facility]   CC: consideration for treatment for chronic hepatitis C  HPI:  +Jermaine Moody is a 64 y.o. male who presents for initial evaluation and management of chronic hepatitis C.  Patient tested positive this year during routine screening by his previous PCP and sent here by his current PCP, Dr. Criss Rosales. Hepatitis C-associated risk factors present are: none. Patient denies intranasal drug use, IV drug abuse, renal dialysis, sexual contact with person with liver disease, tattoos. Patient has had other studies performed. Results: hepatitis C RNA by PCR, result: positive. Patient has not had prior treatment for Hepatitis C. Patient does not have a past history of liver disease. Patient does not have a family history of liver disease. Patient does not  have associated signs or symptoms related to liver disease.  Labs reviewed and confirm chronic hepatitis C with a positive viral load.   Records reviewed from Dr. Fransico Setters office, BP medications, biliary stones.  Ultrasounds noted.       Patient does not have documented immunity to Hepatitis A. Patient does not have documented immunity to Hepatitis B.    Review of Systems:   Constitutional: negative for fatigue Genitourinary: negative for hematuria Musculoskeletal: negative for arthralgias All other systems reviewed and are negative      Past Medical History  Diagnosis Date  . Hypertension   . Kidney stones   . History of blood transfusion ~ 1976    "that's why I have Hepatitis C now"  . Hepatitis C ~ 1976  . Seasonal allergies     Prior to Admission medications   Medication Sig Start Date End Date Taking? Authorizing Provider  benazepril (LOTENSIN) 20 MG tablet Take 20 mg by mouth daily.   Yes Historical Provider, MD  hydrochlorothiazide (MICROZIDE) 12.5 MG capsule TAKE 1 CAPSULE BY MOUTH DAILY. 11/02/13  Yes Ricard Dillon, MD  ibuprofen (ADVIL,MOTRIN) 600 MG tablet Take 1 tablet (600 mg total) by  mouth every 8 (eight) hours as needed. 02/15/14  Yes Ricard Dillon, MD  ondansetron (ZOFRAN-ODT) 4 MG disintegrating tablet Take 1 tablet (4 mg total) by mouth every 6 (six) hours as needed for nausea. 07/26/15  Yes Thomas Cornett, MD  Ledipasvir-Sofosbuvir (HARVONI) 90-400 MG TABS Take 1 tablet by mouth daily. 09/06/15   Thayer Headings, MD    Allergies  Allergen Reactions  . Other     NO BLOOD OR BLOOD PRODUCTS    Social History  Substance Use Topics  . Smoking status: Former Smoker -- 1.50 packs/day for 15 years    Quit date: 01/11/1985  . Smokeless tobacco: Never Used  . Alcohol Use: No     Comment: "recovering alcoholic 07/26/7828"    Family History  Problem Relation Age of Onset  . Heart disease Mother   . Heart disease Father    No liver cirrhosis, no liver cancer   Objective:  Constitutional: in no apparent distress and alert,  Filed Vitals:   09/06/15 1100  BP: 150/106  Pulse: 88  Temp: 98.1 F (36.7 C)   Eyes: anicteric Cardiovascular: Cor RRR and No murmurs Respiratory: clear Gastrointestinal: Bowel sounds are normal, liver is not enlarged, spleen is not enlarged Musculoskeletal: peripheral pulses normal, no pedal edema, no clubbing or cyanosis Skin: negative for - jaundice, spider hemangioma, telangiectasia, palmar erythema, ecchymosis and atrophy; no porphyria cutanea tarda Lymphatic: no cervical lymphadenopathy   Laboratory Genotype:  Lab Results  Component Value Date   HCVGENOTYPE 1a 07/05/2015  HCV viral load: No results found for: HCVQUANT Lab Results  Component Value Date   WBC 10.7* 07/25/2015   HGB 12.1* 07/25/2015   HCT 35.6* 07/25/2015   MCV 85.6 07/25/2015   PLT 135* 07/25/2015    Lab Results  Component Value Date   CREATININE 0.78 07/25/2015   BUN 8 07/25/2015   NA 134* 07/25/2015   K 3.5 07/25/2015   CL 97* 07/25/2015   CO2 27 07/25/2015    Lab Results  Component Value Date   ALT 47 07/25/2015   AST 59* 07/25/2015    ALKPHOS 75 07/25/2015     Labs and history reviewed and show CHILD-PUGH A  5-6 points: Child class A 7-9 points: Child class B 10-15 points: Child class C  Lab Results  Component Value Date   INR 0.95 07/05/2015   BILITOT 0.6 07/25/2015   ALBUMIN 2.9* 07/25/2015     Assessment: New Patient with Chronic Hepatitis C genotype 1a, untreated.  I discussed with the patient the lab findings that confirm chronic hepatitis C as well as the natural history and progression of disease including about 30% of people who develop cirrhosis of the liver if left untreated and once cirrhosis is established there is a 2-7% risk per year of liver cancer and liver failure.  I discussed the importance of treatment and benefits in reducing the risk, even if significant liver fibrosis exists.   Plan: 1) Patient counseled extensively on limiting acetaminophen to no more than 2 grams daily, avoidance of alcohol. 2) Transmission discussed with patient including sexual transmission, sharing razors and toothbrush.   3) Will need referral to gastroenterology if concern for cirrhosis 4) Will need referral for substance abuse counseling: No.; Further work up to include urine drug screen  No. 5) Will prescribe Harvoni for 12 weeks 6) Hepatitis A vaccine Yes.   7) Hepatitis B vaccine Yes.   8) Pneumovax vaccine if concern for cirrhosis 9) Further work up to include liver staging with elastography 10) will follow up after starting medication.

## 2015-09-06 NOTE — Patient Instructions (Signed)
Date 09/06/2015  Dear [name], As discussed in the [facility], your hepatitis C therapy will include the following medications:          Harvoni 90mg /400mg  tablet:           Take 1 tablet by mouth once daily   Please note that ALL MEDICATIONS WILL START ON THE SAME DATE for a total of 12 weeks. ---------------------------------------------------------------- Your HCV Treatment Start Date: TBA   Your HCV genotype:  1a    Liver Fibrosis: TBD    ---------------------------------------------------------------- YOUR PHARMACY CONTACT:   [place] [facility] [place] Phone: [phone] Hours: Monday to Friday 7:30 am to 6:00 pm   Please always contact your pharmacy at least 3-4 business days before you run out of medications to ensure your next month's medication is ready or 1 week prior to running out if you receive it by mail.  Remember, each prescription is for 28 days. ---------------------------------------------------------------- GENERAL NOTES REGARDING YOUR HEPATITIS C MEDICATION:  SOFOSBUVIR/LEDIPASVIR (HARVONI): - Harvoni tablet is taken daily with OR without food. - The tablets are orange. - The tablets should be stored at room temperature.  - Acid reducing agents such as H2 blockers (ie. Pepcid (famotidine), Zantac (ranitidine), Tagamet (cimetidine), Axid (nizatidine) and proton pump inhibitors (ie. Prilosec (omeprazole), Protonix (pantoprazole), Nexium (esomeprazole), or Aciphex (rabeprazole)) can decrease effectiveness of Harvoni. Do not take until you have discussed with a health care provider.    -Antacids that contain magnesium and/or aluminum hydroxide (ie. Milk of Magensia, Rolaids, Gaviscon, Maalox, Mylanta, an dArthritis Pain Formula)can reduce absorption of Harvoni, so take them at least 4 hours before or after Harvoni.  -Calcium carbonate (calcium supplements or antacids such as Tums, Caltrate,  Os-Cal)needs to be taken at least 4 hours hours before or after Harvoni.  -St. John's wort or any products that contain St. John's wort like some herbal supplements  Please inform the office prior to starting any of these medications.  - The common side effects associated with Harvoni include:      1. Fatigue      2. Headache      3. Nausea      4. Diarrhea      5. Insomnia  Please note that this only lists the most common side effects and is NOT a comprehensive list of the potential side effects of these medications. For more information, please review the drug information sheets that come with your medication package from the pharmacy.  ---------------------------------------------------------------- GENERAL HELPFUL HINTS ON HCV THERAPY: 1. Stay well-hydrated. 2. Notify the [facility] Clinic of any changes in your other over-the-counter/herbal or prescription medications. 3. If you miss a dose of your medication, take the missed dose as soon as you remember. Return to your regular time/dose schedule the next day.  4.  Do not stop taking your medications without first talking with your healthcare provider. 5.  You may take Tylenol (acetaminophen), as long as the dose is less than 2000 mg (OR no more than 4 tablets of the Tylenol Extra Strengths 500mg  tablet) in 24 hours. 6.  You will see our pharmacist-specialist within the first 2 weeks of starting your medication. 7.  You will need to obtain routine labs around week 4 and12 weeks after starting and then 3 to 6 months after finishing Harvoni.    Scharlene Gloss, [place] for [place],  [place]  [id number] [phone]

## 2015-09-07 ENCOUNTER — Encounter: Payer: 59 | Admitting: Internal Medicine

## 2015-09-07 ENCOUNTER — Encounter: Payer: Self-pay | Admitting: Pharmacy Technician

## 2015-09-20 ENCOUNTER — Ambulatory Visit: Payer: 59 | Admitting: Pharmacist

## 2015-09-20 DIAGNOSIS — B182 Chronic viral hepatitis C: Secondary | ICD-10-CM

## 2015-09-20 NOTE — Progress Notes (Signed)
HPI: Jermaine Moody is a 64 y.o. male who presents to the [facility] for follow-up of his Hep C infection.  Lab Results  Component Value Date   HCVGENOTYPE 1a 07/05/2015    Allergies: Allergies  Allergen Reactions  . Other     NO BLOOD OR BLOOD PRODUCTS    Vitals:    Past Medical History: Past Medical History  Diagnosis Date  . Hypertension   . Kidney stones   . History of blood transfusion ~ 1976    "that's why I have Hepatitis C now"  . Hepatitis C ~ 1976  . Seasonal allergies     Social History: Social History   Social History  . Marital Status: Married    Spouse Name: N/A  . Number of Children: N/A  . Years of Education: N/A   Social History Main Topics  . Smoking status: Former Smoker -- 1.50 packs/day for 15 years    Quit date: 01/11/1985  . Smokeless tobacco: Never Used  . Alcohol Use: No     Comment: "recovering alcoholic 5/64/3329"  . Drug Use: No  . Sexual Activity: Not Currently   Other Topics Concern  . Not on file   Social History Narrative    Labs: No results found for: HIV1RNAQUANT, HIV1RNAVL, CD4TABS, HEPBSAB, HEPBSAG, HCVAB  Lab Results  Component Value Date   HCVGENOTYPE 1a 07/05/2015    No flowsheet data found.  AST (U/L)  Date Value  07/25/2015 59*  07/12/2015 45*  12/18/2013 51*   ALT (U/L)  Date Value  07/25/2015 47  07/12/2015 56  12/18/2013 48   INR (no units)  Date Value  07/05/2015 0.95    CrCl: CrCl cannot be calculated (Patient has no serum creatinine result on file.).  Fibrosis Score: He has an appointment with radiology on the 17th  Previous Treatment Regimen: None - diagnosed in the 90s. Thinks he got it from a blood transfusion  Assessment: Jermaine Moody is here today for follow-up with [place] for his Hep C infection.  He started taking Harvoni about 2 weeks ago.  He is not having any issues taking the medication so far.  He is a little tired, but nothing he can't handle.  I reviewed his  medication list and made any necessary adjustments.  He is not taking any OTC or Rx antacids and states that he doesn't need to.  I educated him to make sure and take the medication at the same time every night and not to miss any doses.  He understands.  He has a follow-up with lab on Monday, November 21 at 11:30am, and a follow-up with Dr. Linus Salmons on Monday, January 9 at 10am.  Recommendations: Continue taking [place] Stay away from antacids  Follow-up with lab on Monday, November 21 at 11:30 Follow-up appointment with Dr. Linus Salmons Monday, January 9 at [place]. Nicole Kindred, PharmD PGY2 Infectious Diseases Pharmacy Resident Pager: [phone] 09/20/2015 9:06 AM

## 2015-09-29 ENCOUNTER — Ambulatory Visit (HOSPITAL_COMMUNITY): Payer: 59

## 2015-09-30 ENCOUNTER — Other Ambulatory Visit: Payer: 59

## 2015-09-30 DIAGNOSIS — B182 Chronic viral hepatitis C: Secondary | ICD-10-CM

## 2015-10-03 ENCOUNTER — Other Ambulatory Visit: Payer: 59

## 2015-10-04 ENCOUNTER — Ambulatory Visit (HOSPITAL_COMMUNITY)
Admission: RE | Admit: 2015-10-04 | Discharge: 2015-10-04 | Disposition: A | Discharge status: Home / Self Care | Payer: 59 | Source: Ambulatory Visit | Attending: Internal Medicine | Admitting: Internal Medicine

## 2015-10-04 DIAGNOSIS — B182 Chronic viral hepatitis C: Secondary | ICD-10-CM | POA: Insufficient documentation

## 2015-10-04 DIAGNOSIS — Z9049 Acquired absence of other specified parts of digestive tract: Secondary | ICD-10-CM | POA: Diagnosis not present

## 2015-10-04 DIAGNOSIS — K769 Liver disease, unspecified: Secondary | ICD-10-CM | POA: Insufficient documentation

## 2015-10-04 LAB — HEPATITIS C RNA QUANTITATIVE: HCV Quantitative: NOT DETECTED IU/mL (ref <15)

## 2015-11-21 ENCOUNTER — Ambulatory Visit: Payer: 59 | Admitting: Internal Medicine

## 2015-11-22 ENCOUNTER — Other Ambulatory Visit: Payer: Self-pay | Admitting: Internal Medicine

## 2015-11-22 DIAGNOSIS — B1920 Unspecified viral hepatitis C without hepatic coma: Secondary | ICD-10-CM

## 2015-12-06 ENCOUNTER — Other Ambulatory Visit: Payer: 59

## 2015-12-06 DIAGNOSIS — B1920 Unspecified viral hepatitis C without hepatic coma: Secondary | ICD-10-CM

## 2015-12-07 LAB — HEPATITIS C RNA QUANTITATIVE: HCV QUANT: NOT DETECTED [IU]/mL (ref <15)

## 2015-12-16 MED FILL — HYDROCHLOROTHIAZIDE 12.5 MG: 12.5 | 90 days supply | Qty: 90 | Fill #1

## 2015-12-16 MED FILL — BENAZEPRIL HCL 20 MG TABLET: 20 | 90 days supply | Qty: 90 | Fill #1

## 2016-03-10 IMAGING — CR DG ABDOMEN 1V
2 series · 2 of 2 positions shown · non-contrast
Comparison: Ultrasound dated 05/13/2015 and CT dated 10/18/2011
caps

CLINICAL DATA: 64-year-old male with lower abdominal pain an ileus.

EXAM:
ABDOMEN - 1 VIEW

[abdomen kub (1 of 2)]
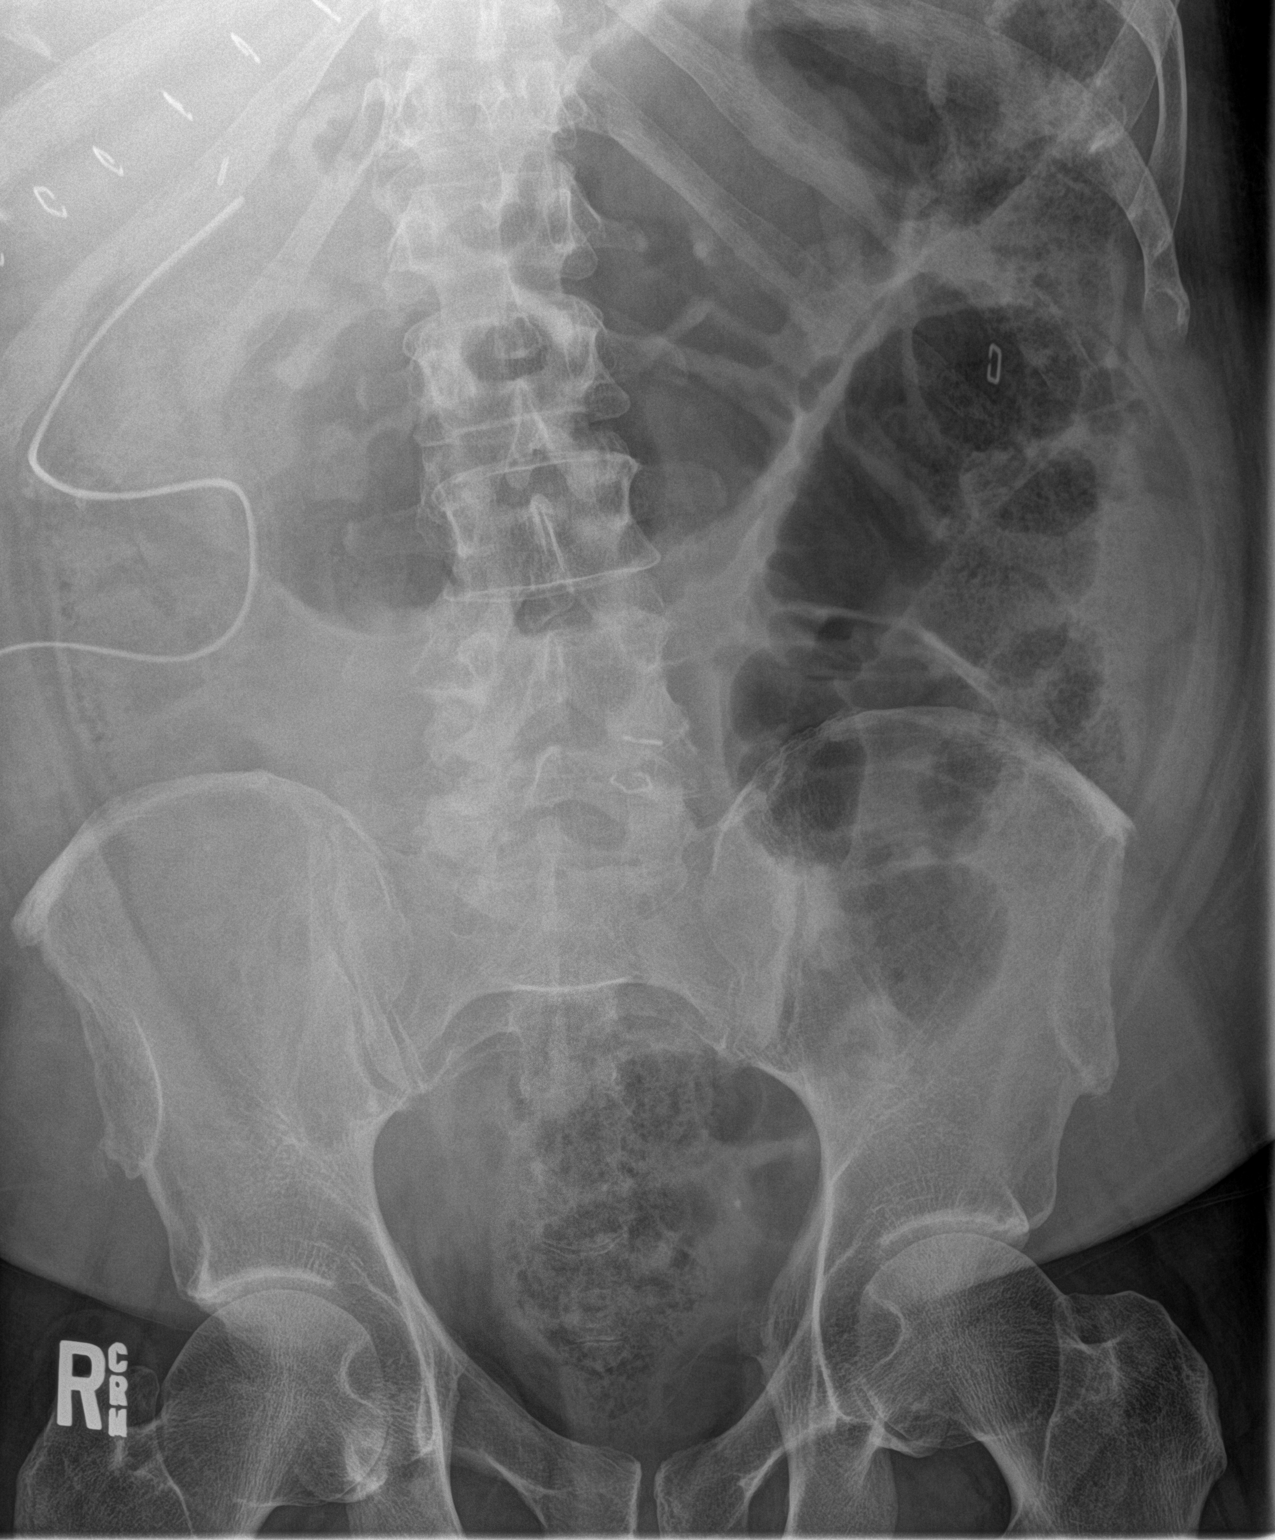

[abdomen kub (2 of 2)]
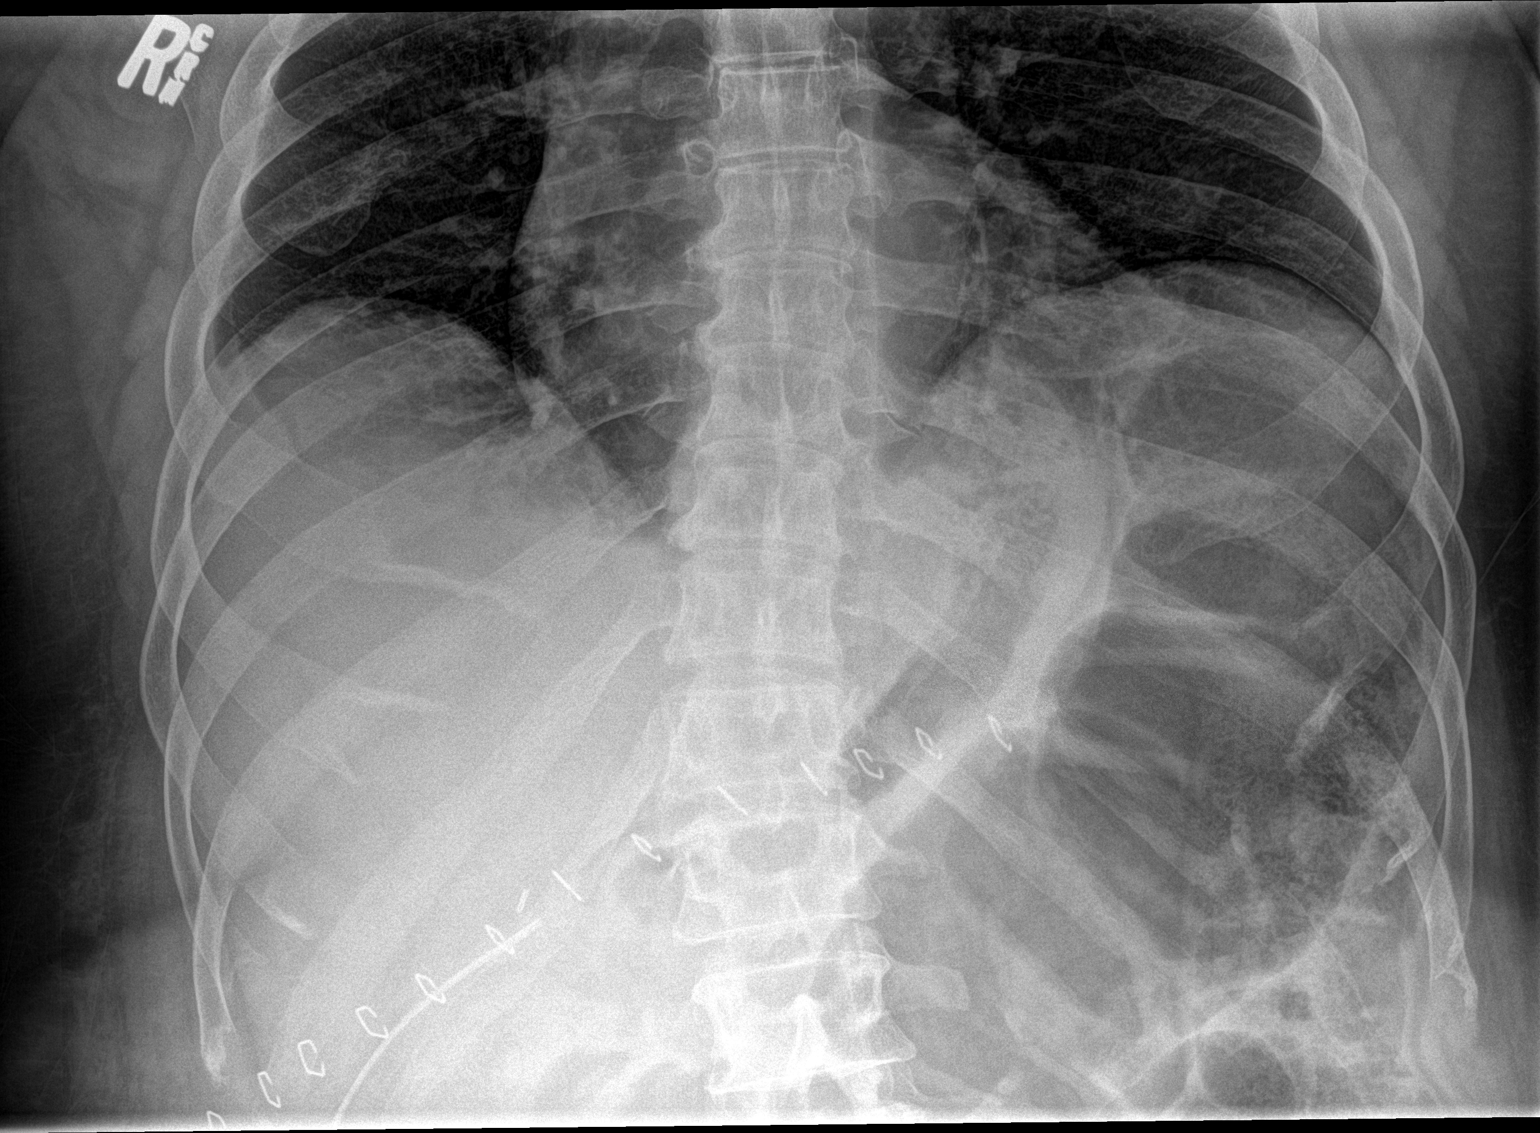

[2 of 2 positions shown; findings below may reference images not displayed]

FINDINGS: The visualized lung bases are clear. No intra-abdominal free air
identified.

There is an air distended loop of bowel in the stop midabdomen
extending into the left upper quadrant, likely representing air
distended transverse colon. An is healed dilated loop of bowel noted
in the left lower quadrant measuring 4.3 cm in diameter.

Right upper quadrant cholecystectomy clips noted. A drainage
catheter is noted in the right mid quadrant, likely at the
cholecystectomy bed. Cutaneous staples noted in the right upper
quadrant.
IMPRESSION: Mildly dilated loop of small bowel in the left lower abdomen likely
representing an ileus versus small-bowel obstruction. Clinical
correlation and follow-up recommended.

## 2016-03-29 DIAGNOSIS — R7301 Impaired fasting glucose: Secondary | ICD-10-CM | POA: Diagnosis not present

## 2016-03-29 DIAGNOSIS — E669 Obesity, unspecified: Secondary | ICD-10-CM | POA: Diagnosis not present

## 2016-03-29 DIAGNOSIS — I10 Essential (primary) hypertension: Secondary | ICD-10-CM | POA: Diagnosis not present

## 2016-03-29 DIAGNOSIS — B1920 Unspecified viral hepatitis C without hepatic coma: Secondary | ICD-10-CM | POA: Diagnosis not present

## 2016-05-24 ENCOUNTER — Encounter: Payer: Self-pay | Admitting: Gastroenterology

## 2016-05-29 MED FILL — HYDROCHLOROTHIAZIDE 12.5 MG: 12.5 | 90 days supply | Qty: 90 | Fill #0

## 2016-05-29 MED FILL — BENAZEPRIL HCL 20 MG TABLET: 20 | 90 days supply | Qty: 90 | Fill #0

## 2016-07-31 ENCOUNTER — Ambulatory Visit (INDEPENDENT_AMBULATORY_CARE_PROVIDER_SITE_OTHER): Payer: 59 | Admitting: Gastroenterology

## 2016-07-31 ENCOUNTER — Other Ambulatory Visit (INDEPENDENT_AMBULATORY_CARE_PROVIDER_SITE_OTHER): Payer: 59

## 2016-07-31 ENCOUNTER — Encounter: Payer: Self-pay | Admitting: Gastroenterology

## 2016-07-31 VITALS — BP 144/68 | HR 70 | Ht 71.0 in | Wt 231.0 lb

## 2016-07-31 DIAGNOSIS — Z8619 Personal history of other infectious and parasitic diseases: Secondary | ICD-10-CM

## 2016-07-31 DIAGNOSIS — Z1211 Encounter for screening for malignant neoplasm of colon: Secondary | ICD-10-CM | POA: Diagnosis not present

## 2016-07-31 DIAGNOSIS — B1920 Unspecified viral hepatitis C without hepatic coma: Secondary | ICD-10-CM

## 2016-07-31 DIAGNOSIS — R932 Abnormal findings on diagnostic imaging of liver and biliary tract: Secondary | ICD-10-CM | POA: Diagnosis not present

## 2016-07-31 LAB — CBC WITH DIFFERENTIAL/PLATELET
BASOS PCT: 0.5 % (ref 0.0–3.0)
Basophils Absolute: 0 10*3/uL (ref 0.0–0.1)
EOS PCT: 1.9 % (ref 0.0–5.0)
Eosinophils Absolute: 0.2 10*3/uL (ref 0.0–0.7)
HCT: 46 % (ref 39.0–52.0)
Hemoglobin: 15.6 g/dL (ref 13.0–17.0)
LYMPHS ABS: 2.5 10*3/uL (ref 0.7–4.0)
Lymphocytes Relative: 26.7 % (ref 12.0–46.0)
MCHC: 33.9 g/dL (ref 30.0–36.0)
MCV: 85.1 fl (ref 78.0–100.0)
MONO ABS: 1 10*3/uL (ref 0.1–1.0)
Monocytes Relative: 10.7 % (ref 3.0–12.0)
NEUTROS ABS: 5.7 10*3/uL (ref 1.4–7.7)
NEUTROS PCT: 60.2 % (ref 43.0–77.0)
Platelets: 99 10*3/uL — ABNORMAL LOW (ref 150.0–400.0)
RBC: 5.41 Mil/uL (ref 4.22–5.81)
RDW: 14.2 % (ref 11.5–15.5)
WBC: 9.5 10*3/uL (ref 4.0–10.5)

## 2016-07-31 LAB — PROTIME-INR
INR: 1.1 ratio — AB (ref 0.8–1.0)
PROTHROMBIN TIME: 11.8 s (ref 9.6–13.1)

## 2016-07-31 LAB — HEPATIC FUNCTION PANEL
ALT: 12 U/L (ref 0–53)
AST: 14 U/L (ref 0–37)
Albumin: 4.1 g/dL (ref 3.5–5.2)
Alkaline Phosphatase: 60 U/L (ref 39–117)
BILIRUBIN TOTAL: 0.4 mg/dL (ref 0.2–1.2)
Bilirubin, Direct: 0 mg/dL (ref 0.0–0.3)
Total Protein: 7.8 g/dL (ref 6.0–8.3)

## 2016-07-31 MED ORDER — NA SULFATE-K SULFATE-MG SULF 17.5-3.13-1.6 GM/177ML PO SOLN: 1.0000 | ORAL | 0 refills | Status: AC

## 2016-07-31 NOTE — Patient Instructions (Signed)
If you are age 64 or older, your body mass index should be between 23-30. Your Body mass index is 32.22 kg/m. If this is out of the aforementioned range listed, please consider follow up with your Primary Care Provider.  If you are age 37 or younger, your body mass index should be between 19-25. Your Body mass index is 32.22 kg/m. If this is out of the aformentioned range listed, please consider follow up with your Primary Care Provider.   You have been scheduled for a colonoscopy. Please follow written instructions given to you at your visit today.  Please pick up your prep supplies at the pharmacy within the next 1-3 days. If you use inhalers (even only as needed), please bring them with you on the day of your procedure. Your physician has requested that you go to www.startemmi.com and enter the access code given to you at your visit today. This web site gives a general overview about your procedure. However, you should still follow specific instructions given to you by our office regarding your preparation for the procedure. You have been scheduled for an abdominal ultrasound at [facility] (1st floor of hospital) on 08/03/16 at 11:00am. Please arrive 15 minutes prior to your appointment for registration. Make certain not to have anything to eat or drink 6 hours prior to your appointment. Should you need to reschedule your appointment, please contact radiology at [phone]. This test typically takes about 30 minutes to perform. Your physician has requested that you go to the basement for the following lab work before leaving today:
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
## 2016-08-01 ENCOUNTER — Encounter: Payer: Self-pay | Admitting: Gastroenterology

## 2016-08-01 LAB — AFP TUMOR MARKER: AFP TUMOR MARKER: 4.2 ng/mL (ref <6.1)

## 2016-08-03 ENCOUNTER — Ambulatory Visit (HOSPITAL_COMMUNITY): Payer: 59

## 2016-08-07 ENCOUNTER — Encounter: Payer: 59 | Admitting: Gastroenterology

## 2016-08-09 ENCOUNTER — Ambulatory Visit (HOSPITAL_COMMUNITY)
Admission: RE | Admit: 2016-08-09 | Discharge: 2016-08-09 | Disposition: A | Discharge status: Home / Self Care | Payer: 59 | Source: Ambulatory Visit | Attending: Gastroenterology | Admitting: Gastroenterology

## 2016-08-09 ENCOUNTER — Encounter: Payer: Self-pay | Admitting: Gastroenterology

## 2016-08-09 DIAGNOSIS — Z1211 Encounter for screening for malignant neoplasm of colon: Secondary | ICD-10-CM | POA: Diagnosis not present

## 2016-08-09 DIAGNOSIS — Z9049 Acquired absence of other specified parts of digestive tract: Secondary | ICD-10-CM | POA: Diagnosis not present

## 2016-08-09 DIAGNOSIS — Z8619 Personal history of other infectious and parasitic diseases: Secondary | ICD-10-CM | POA: Diagnosis not present

## 2016-08-09 DIAGNOSIS — B1920 Unspecified viral hepatitis C without hepatic coma: Secondary | ICD-10-CM | POA: Diagnosis not present

## 2016-09-04 ENCOUNTER — Other Ambulatory Visit: Payer: Self-pay

## 2016-09-04 ENCOUNTER — Telehealth: Payer: Self-pay | Admitting: Gastroenterology

## 2016-09-04 MED ORDER — NA SULFATE-K SULFATE-MG SULF 17.5-3.13-1.6 GM/177ML PO SOLN: 1.0000 | Freq: Once | ORAL | 0 refills | Status: AC

## 2016-09-04 NOTE — Telephone Encounter (Signed)
Suprep sent to [facility]

## 2016-09-06 NOTE — Telephone Encounter (Signed)
Patient states that he cannot afford copay

## 2016-09-06 NOTE — Telephone Encounter (Signed)
Suprep sample up front beside of April. Pt's wife (stephanie will be picking up) 09/07/16

## 2016-09-10 ENCOUNTER — Encounter: Payer: Self-pay | Admitting: Gastroenterology

## 2016-09-10 ENCOUNTER — Ambulatory Visit (AMBULATORY_SURGERY_CENTER): Payer: 59 | Admitting: Gastroenterology

## 2016-09-10 VITALS — BP 149/99 | HR 59 | Temp 98.0°F | Resp 16 | Ht 71.0 in | Wt 231.0 lb

## 2016-09-10 DIAGNOSIS — Z86010 Personal history of colonic polyps: Secondary | ICD-10-CM

## 2016-09-10 DIAGNOSIS — D123 Benign neoplasm of transverse colon: Secondary | ICD-10-CM | POA: Diagnosis not present

## 2016-09-10 DIAGNOSIS — C182 Malignant neoplasm of ascending colon: Secondary | ICD-10-CM

## 2016-09-10 DIAGNOSIS — C186 Malignant neoplasm of descending colon: Secondary | ICD-10-CM | POA: Diagnosis not present

## 2016-09-10 DIAGNOSIS — D120 Benign neoplasm of cecum: Secondary | ICD-10-CM | POA: Diagnosis not present

## 2016-09-10 DIAGNOSIS — D122 Benign neoplasm of ascending colon: Secondary | ICD-10-CM | POA: Diagnosis not present

## 2016-09-10 DIAGNOSIS — Z1211 Encounter for screening for malignant neoplasm of colon: Secondary | ICD-10-CM | POA: Diagnosis not present

## 2016-09-10 DIAGNOSIS — D124 Benign neoplasm of descending colon: Secondary | ICD-10-CM

## 2016-09-10 DIAGNOSIS — I10 Essential (primary) hypertension: Secondary | ICD-10-CM | POA: Diagnosis not present

## 2016-09-10 DIAGNOSIS — B1920 Unspecified viral hepatitis C without hepatic coma: Secondary | ICD-10-CM | POA: Diagnosis not present

## 2016-09-10 MED ORDER — SODIUM CHLORIDE 0.9 % IV SOLN: 500.0000 mL | INTRAVENOUS | Status: DC

## 2016-09-10 NOTE — Progress Notes (Signed)
Report to PACU, RN, vss, BBS= Clear.  

## 2016-09-10 NOTE — Patient Instructions (Addendum)
   AWAIT PATHOLOGY RESULTS  NO ASPIRIN,IBUPROFEN,NAPROXEN,OR OTHER NON STEROIDAL ANTI INFLAMMATORY PRODUCTS FOR 2 WEEKS AFTER POLYP REMOVAL    YOU HAD AN ENDOSCOPIC PROCEDURE TODAY AT [facility]:   Refer to the procedure report that was given to you for any specific questions about what was found during the examination.  If the procedure report does not answer your questions, please call your gastroenterologist to clarify.  If you requested that your care partner not be given the details of your procedure findings, then the procedure report has been included in a sealed envelope for you to review at your convenience later.  YOU SHOULD EXPECT: Some feelings of bloating in the abdomen. Passage of more gas than usual.  Walking can help get rid of the air that was put into your GI tract during the procedure and reduce the bloating. If you had a lower endoscopy (such as a colonoscopy or flexible sigmoidoscopy) you may notice spotting of blood in your stool or on the toilet paper. If you underwent a bowel prep for your procedure, you may not have a normal bowel movement for a few days.  Please Note:  You might notice some irritation and congestion in your nose or some drainage.  This is from the oxygen used during your procedure.  There is no need for concern and it should clear up in a day or so.  SYMPTOMS TO REPORT IMMEDIATELY:   Following lower endoscopy (colonoscopy or flexible sigmoidoscopy):  Excessive amounts of blood in the stool  Significant tenderness or worsening of abdominal pains  Swelling of the abdomen that is new, acute  Fever of 100F or higher    For urgent or emergent issues, a gastroenterologist can be reached at any hour by calling [phone].   DIET:  We do recommend a small meal at first, but then you may proceed to your regular diet.  Drink plenty of fluids but you should avoid alcoholic beverages for 24 hours.  ACTIVITY:  You should plan to take  it easy for the rest of today and you should NOT DRIVE or use heavy machinery until tomorrow (because of the sedation medicines used during the test).    FOLLOW UP: Our staff will call the number listed on your records the next business day following your procedure to check on you and address any questions or concerns that you may have regarding the information given to you following your procedure. If we do not reach you, we will leave a message.  However, if you are feeling well and you are not experiencing any problems, there is no need to return our call.  We will assume that you have returned to your regular daily activities without incident.  If any biopsies were taken you will be contacted by phone or by letter within the next 1-3 weeks.  Please call us at [phone] if you have not heard about the biopsies in 3 weeks.    SIGNATURES/CONFIDENTIALITY: You and/or your care partner have signed paperwork which will be entered into your electronic medical record.  These signatures attest to the fact that that the information above on your After Visit Summary has been reviewed and is understood.  Full responsibility of the confidentiality of this discharge information lies with you and/or your care-partner.   INFORMATION ON POLYPS GIVEN TO YOU TODAY

## 2016-09-10 NOTE — Op Note (Signed)
[facility] Patient Name: Jermaine Moody Procedure Date: 09/10/2016 3:03 PM MRN: [id number] Endoscopist: Remo Lipps P. Armbruster MD, MD Age: 65 Referring MD:  Date of Birth: [birth year] Gender: Male Account #: [id number] Procedure:                Colonoscopy Indications:              Surveillance: Personal history of adenomatous                            polyps on last colonoscopy > 10 years ago Medicines:                Monitored Anesthesia Care Procedure:                Pre-Anesthesia Assessment:                           - Prior to the procedure, a History and Physical                            was performed, and patient medications and                            allergies were reviewed. The patient's tolerance of                            previous anesthesia was also reviewed. The risks                            and benefits of the procedure and the sedation                            options and risks were discussed with the patient.                            All questions were answered, and informed consent                            was obtained. Prior Anticoagulants: The patient has                            taken no previous anticoagulant or antiplatelet                            agents. ASA Grade Assessment: II - A patient with                            mild systemic disease. After reviewing the risks                            and benefits, the patient was deemed in                            satisfactory condition to undergo the procedure.  After obtaining informed consent, the colonoscope                            was passed under direct vision. Throughout the                            procedure, the patient's blood pressure, pulse, and                            oxygen saturations were monitored continuously. The                            Model CF-HQ190L [id number]) scope was introduced                            through the  anus and advanced to the the cecum,                            identified by appendiceal orifice and ileocecal                            valve. The colonoscopy was performed without                            difficulty. The patient tolerated the procedure                            well. The quality of the bowel preparation was                            adequate. The ileocecal valve, appendiceal orifice,                            and rectum were photographed. Scope In: 3:19:17 PM Scope Out: 3:44:53 PM Scope Withdrawal Time: 0 hours 23 minutes 50 seconds  Total Procedure Duration: 0 hours 25 minutes 36 seconds  Findings:                 The perianal and digital rectal examinations were                            normal.                           Two medium-sized angiodysplastic lesions without                            bleeding were found in the ascending colon and in                            the cecum.                           Two sessile polyps were found in the cecum. The  polyps were 5 to 6 mm in size. These polyps were                            removed with a cold snare. Resection and retrieval                            were complete.                           A 5 mm polyp was found in the ascending colon. The                            polyp was sessile. The polyp was removed with a                            cold snare. Resection and retrieval were complete.                           Two sessile polyps were found in the transverse                            colon. The polyps were 4 to 5 mm in size. These                            polyps were removed with a cold snare. Resection                            and retrieval were complete.                           A 6 mm polyp was found in the splenic flexure. The                            polyp was sessile. The polyp was removed with a                            cold snare. Resection and retrieval  were complete.                           A roughly 15 mm polypoid lesion was found in the                            descending colon. The lesion was centrally                            umbilicated. No bleeding was present. Biopsies were                            taken with a cold forceps for histology to rule out                            malignancy. Area proximal and distal  to the lesion                            was tattooed with an injection of Spot (carbon                            black).                           Non-bleeding internal hemorrhoids were found during                            retroflexion.                           The exam was otherwise without abnormality. The                            prep was fair in the right colon on intubation                            however following several minutes of lavage the                            views were adequate for screening purposes. Complications:            No immediate complications. Estimated blood loss:                            Minimal. Estimated Blood Loss:     Estimated blood loss was minimal. Impression:               - Two non-bleeding colonic angiodysplastic lesions.                           - Two 5 to 6 mm polyps in the cecum, removed with a                            cold snare. Resected and retrieved.                           - One 5 mm polyp in the ascending colon, removed                            with a cold snare. Resected and retrieved.                           - Two 4 to 5 mm polyps in the transverse colon,                            removed with a cold snare. Resected and retrieved.                           - One 6 mm polyp at the splenic flexure, removed  with a cold snare. Resected and retrieved.                           - Polypoid lesion in the descending colon. Biopsied                            to rule out malignancy. Tattooed.                           -  Non-bleeding internal hemorrhoids.                           - The examination was otherwise normal. Recommendation:           - Patient has a contact number available for                            emergencies. The signs and symptoms of potential                            delayed complications were discussed with the                            patient. Return to normal activities tomorrow.                            Written discharge instructions were provided to the                            patient.                           - Resume previous diet.                           - Continue present medications.                           - No aspirin, ibuprofen, naproxen, or other                            non-steroidal anti-inflammatory drugs for 2 weeks                            after polyp removal.                           - Await pathology results, you will be contacted as                            soon as the results are available with further                            recommendations. Remo Lipps P. Armbruster MD, MD 09/10/2016 3:54:14 PM This report has been signed electronically.

## 2016-09-11 ENCOUNTER — Telehealth: Payer: Self-pay | Admitting: *Deleted

## 2016-09-11 NOTE — Telephone Encounter (Signed)
  Follow up Call-  Call back number 09/10/2016  Post procedure Call Back phone  # [phone]  Permission to leave phone message Yes  Some recent data might be hidden     Patient questions:  Do you have a fever, pain , or abdominal swelling? Yes.   Pain Score  0 *  Have you tolerated food without any problems? Yes.    Have you been able to return to your normal activities? No.  Do you have any questions about your discharge instructions: Diet   No. Medications  No. Follow up visit  No.  Do you have questions or concerns about your Care? No.  Actions: * If pain score is 4 or above: No action needed, pain <4.

## 2016-09-12 ENCOUNTER — Other Ambulatory Visit: Payer: Self-pay

## 2016-09-12 DIAGNOSIS — C189 Malignant neoplasm of colon, unspecified: Secondary | ICD-10-CM

## 2016-09-13 ENCOUNTER — Other Ambulatory Visit (INDEPENDENT_AMBULATORY_CARE_PROVIDER_SITE_OTHER): Payer: 59

## 2016-09-13 ENCOUNTER — Other Ambulatory Visit: Payer: Self-pay

## 2016-09-13 DIAGNOSIS — C189 Malignant neoplasm of colon, unspecified: Secondary | ICD-10-CM

## 2016-09-13 LAB — BUN: BUN: 13 mg/dL (ref 6–23)

## 2016-09-13 LAB — CREATININE, SERUM: Creatinine, Ser: 1.08 mg/dL (ref 0.40–1.50)

## 2016-09-14 ENCOUNTER — Encounter (HOSPITAL_COMMUNITY): Payer: Self-pay

## 2016-09-14 ENCOUNTER — Ambulatory Visit (HOSPITAL_COMMUNITY)
Admission: RE | Admit: 2016-09-14 | Discharge: 2016-09-14 | Disposition: A | Discharge status: Home / Self Care | Payer: 59 | Source: Ambulatory Visit | Attending: Gastroenterology | Admitting: Gastroenterology

## 2016-09-14 DIAGNOSIS — H52223 Regular astigmatism, bilateral: Secondary | ICD-10-CM | POA: Diagnosis not present

## 2016-09-14 DIAGNOSIS — C189 Malignant neoplasm of colon, unspecified: Secondary | ICD-10-CM | POA: Diagnosis not present

## 2016-09-14 DIAGNOSIS — H5203 Hypermetropia, bilateral: Secondary | ICD-10-CM | POA: Diagnosis not present

## 2016-09-14 DIAGNOSIS — H524 Presbyopia: Secondary | ICD-10-CM | POA: Diagnosis not present

## 2016-09-14 HISTORY — DX: Malignant (primary) neoplasm, unspecified: C80.1

## 2016-09-14 LAB — CEA: CEA: 2.1 ng/mL

## 2016-09-14 LAB — POCT I-STAT CREATININE: Creatinine, Ser: 1 mg/dL (ref 0.61–1.24)

## 2016-09-14 MED ORDER — IOPAMIDOL (ISOVUE-300) INJECTION 61%
100.0000 mL | Freq: Once | INTRAVENOUS | Status: AC | PRN
  Administered 2016-09-14: 100 mL via INTRAVENOUS

## 2016-09-24 ENCOUNTER — Ambulatory Visit: Payer: Self-pay | Admitting: Surgery

## 2016-09-24 DIAGNOSIS — C186 Malignant neoplasm of descending colon: Secondary | ICD-10-CM | POA: Diagnosis not present

## 2016-09-24 DIAGNOSIS — Z01818 Encounter for other preprocedural examination: Secondary | ICD-10-CM | POA: Diagnosis not present

## 2016-09-24 MED FILL — NEOMYCIN 500 MG TABLET: 500 | 1 days supply | Qty: 6 | Fill #0

## 2016-09-24 MED FILL — metroNIDAZOLE 500 MG TABS: 500 | 1 days supply | Qty: 6 | Fill #0
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
## 2016-11-07 MED FILL — BENAZEPRIL HCL 20 MG TABLET: 20 | 90 days supply | Qty: 90 | Fill #1

## 2016-11-07 MED FILL — HYDROCHLOROTHIAZIDE 12.5 MG: 12.5 | 90 days supply | Qty: 90 | Fill #1

## 2016-11-08 NOTE — Patient Instructions (Addendum)
Jermaine Moody  11/08/2016   Your procedure is scheduled on: 11-14-16  Report to [facility]  Entrance take [facility]  elevators to 3rd floor to  [place] at  [place] AM.  Call this number if you have problems the morning of surgery [phone]   Remember: ONLY 1 PERSON MAY GO WITH YOU TO SHORT STAY TO GET  READY MORNING OF [place].  Do not eat food or drink liquids :After Midnight.     Take these medicines the morning of surgery with A SIP OF WATER: Ceterizine (Zyrtec) if needed                                 You may not have any metal on your body including hair pins and              piercings  Do not wear jewelry,  lotions, powders or colognes, deodorant       .              Men may shave face and neck.   Do not bring valuables to the hospital. [place].  Contacts, dentures or bridgework may not be worn into surgery.  Leave suitcase in the car. After surgery it may be brought to your room.   Special Instructions: FOLLOW SURGEON'S INSTRUCTIONS IN REGARDS TO BOWEL PREPARATION PRIOR TO SURGICAL PROCEDURE DATE    _____________________________________________________________________             [facility] - Preparing for Surgery Before surgery, you can play an important role.  Because skin is not sterile, your skin needs to be as free of germs as possible.  You can reduce the number of germs on your skin by washing with CHG (chlorahexidine gluconate) soap before surgery.  CHG is an antiseptic cleaner which kills germs and bonds with the skin to continue killing germs even after washing. Please DO NOT use if you have an allergy to CHG or antibacterial soaps.  If your skin becomes reddened/irritated stop using the CHG and inform your nurse when you arrive at Short Stay. Do not shave (including legs and underarms) for at least 48 hours prior to the first CHG shower.  You may shave your  face/neck. Please follow these instructions carefully:  1.  Shower with CHG Soap the night before surgery and the  morning of Surgery.  2.  If you choose to wash your hair, wash your hair first as usual with your  normal  shampoo.  3.  After you shampoo, rinse your hair and body thoroughly to remove the  shampoo.                           4.  Use CHG as you would any other liquid soap.  You can apply chg directly  to the skin and wash                       Gently with a scrungie or clean washcloth.  5.  Apply the CHG Soap to your body ONLY FROM THE NECK DOWN.   Do not use on face/ open  Wound or open sores. Avoid contact with eyes, ears mouth and genitals (private parts).                       Wash face,  Genitals (private parts) with your normal soap.             6.  Wash thoroughly, paying special attention to the area where your surgery  will be performed.  7.  Thoroughly rinse your body with warm water from the neck down.  8.  DO NOT shower/wash with your normal soap after using and rinsing off  the CHG Soap.                9.  Pat yourself dry with a clean towel.            10.  Wear clean pajamas.            11.  Place clean sheets on your bed the night of your first shower and do not  sleep with pets. Day of Surgery : Do not apply any lotions/deodorants the morning of surgery.  Please wear clean clothes to the hospital/surgery center.  FAILURE TO FOLLOW THESE INSTRUCTIONS MAY RESULT IN THE CANCELLATION OF YOUR SURGERY PATIENT SIGNATURE_________________________________  NURSE SIGNATURE__________________________________  ________________________________________________________________________  COLON BOWEL PREP Please follow the instructions carefully. It is important to clean out your bowels & take the prescribed antibiotic pills to lower your chances of a wound infection or abscess.   FIVE DAYS PRIOR TO YOUR SURGERY Stop eating any nuts, popcorn, or fruit  with seeds. Stop all fiber supplements such as Metamucil, Citrucel, etc.   Hold taking any blood thinning anticoagulation medication (ex: aspirin, warfarin/Coumadin, Plavix, Xarelto, Eliquis, Pradaxa, etc) as recommended by your medical/cardiology doctor  Obtain what you need at a pharmacy of your choice: -Filled out prescriptions for your oral antibiotics (Neomycin & Metronidazole)  -A bottle of MiraLax / Glycolax (288g) - no prescription required  -A large bottle of Gatorade / Powerade (64oz)  -Dulcolax tablets (4 tabs) - no prescription required   DAY PRIOR TO SURGERY   7:00am Swallow 4 Dulcolax tablets with some water Drink plenty of clear liquids all day to avoid getting dehydrated (Water, juice, soda, coffee, tea, bouillon, jello, etc.)  10:00am Mix the bottle of MiraLax with the 64-oz bottle of Gatorade.  Drink the Gatorade mixture gradually over the next few hours (8oz glass every 15-30 minutes) until gone. You should finish by 2pm.  2:00pm Take 2 Neomycin 500mg  tablets & 2 Metronidazole 500mg  tablets  3:00pm Take 2 Neomycin 500mg  tablets & 2 Metronidazole 500mg  tablets  Drink plenty of clear liquids all evening to avoid getting dehydrated  10:00pm Take 2 Neomycin 500mg  tablets & 2 Metronidazole 500mg  tablets  Do not eat or drink anything after bedtime (midnight) the night before your surgery.   MORNING OF SURGERY Remember to not to drink or eat anything that morning  Hold or take medications as recommended by the hospital staff at your Preoperative visit  If you have questions or concerns, please call [facility] [phone] during business hours to speak to the clinical staff for advice.

## 2016-11-09 ENCOUNTER — Encounter (HOSPITAL_COMMUNITY)
Admission: RE | Admit: 2016-11-09 | Discharge: 2016-11-09 | Disposition: A | Discharge status: Home / Self Care | Payer: 59 | Source: Ambulatory Visit | Attending: Surgery | Admitting: Surgery

## 2016-11-09 ENCOUNTER — Encounter (HOSPITAL_COMMUNITY): Payer: Self-pay

## 2016-11-09 DIAGNOSIS — C186 Malignant neoplasm of descending colon: Secondary | ICD-10-CM | POA: Diagnosis not present

## 2016-11-09 DIAGNOSIS — Z01812 Encounter for preprocedural laboratory examination: Secondary | ICD-10-CM | POA: Diagnosis not present

## 2016-11-09 DIAGNOSIS — Z01810 Encounter for preprocedural cardiovascular examination: Secondary | ICD-10-CM | POA: Insufficient documentation

## 2016-11-09 HISTORY — DX: Personal history of urinary calculi: Z87.442

## 2016-11-09 HISTORY — DX: Presence of spectacles and contact lenses: Z97.3

## 2016-11-09 LAB — CBC
HEMATOCRIT: 44.4 % (ref 39.0–52.0)
HEMOGLOBIN: 15.1 g/dL (ref 13.0–17.0)
MCH: 28.5 pg (ref 26.0–34.0)
MCHC: 34 g/dL (ref 30.0–36.0)
MCV: 83.9 fL (ref 78.0–100.0)
Platelets: 96 10*3/uL — ABNORMAL LOW (ref 150–400)
RBC: 5.29 MIL/uL (ref 4.22–5.81)
RDW: 14.3 % (ref 11.5–15.5)
WBC: 8.8 10*3/uL (ref 4.0–10.5)

## 2016-11-09 LAB — COMPREHENSIVE METABOLIC PANEL
ALBUMIN: 4.3 g/dL (ref 3.5–5.0)
ALK PHOS: 60 U/L (ref 38–126)
ALT: 14 U/L — ABNORMAL LOW (ref 17–63)
AST: 15 U/L (ref 15–41)
Anion gap: 9 (ref 5–15)
BILIRUBIN TOTAL: 0.6 mg/dL (ref 0.3–1.2)
BUN: 26 mg/dL — AB (ref 6–20)
CALCIUM: 9.3 mg/dL (ref 8.9–10.3)
CO2: 24 mmol/L (ref 22–32)
Chloride: 104 mmol/L (ref 101–111)
Creatinine, Ser: 0.97 mg/dL (ref 0.61–1.24)
GFR calc Af Amer: >60 mL/min (ref >60)
GFR calc non Af Amer: >60 mL/min (ref >60)
GLUCOSE: 92 mg/dL (ref 65–99)
POTASSIUM: 4 mmol/L (ref 3.5–5.1)
SODIUM: 137 mmol/L (ref 135–145)
TOTAL PROTEIN: 7.6 g/dL (ref 6.5–8.1)

## 2016-11-09 LAB — PROTIME-INR
INR: 1
Prothrombin Time: 13.2 seconds (ref 11.4–15.2)

## 2016-11-09 LAB — NO BLOOD PRODUCTS

## 2016-11-09 NOTE — Progress Notes (Signed)
Spoke with Sunday Spillers at [place] with Dr Johney [place] in regards to blood refusal; also faxed copy of consent to Dr Clyda Greener office and to [facility] blood bank.

## 2016-11-09 NOTE — Progress Notes (Signed)
CBC and CMP results in epic per PAT visit 11/09/2016 sent to Dr Johney [place]

## 2016-11-10 LAB — HEMOGLOBIN A1C
HEMOGLOBIN A1C: 5.7 % — AB (ref 4.8–5.6)
MEAN PLASMA GLUCOSE: 117 mg/dL

## 2016-11-13 NOTE — Anesthesia Preprocedure Evaluation (Addendum)
Anesthesia Evaluation  Patient identified by MRN, date of birth, ID band Patient awake    Reviewed: Allergy & Precautions, NPO status , Patient's Chart, lab work & pertinent test results  Airway Mallampati: II  TM Distance: >3 FB Neck ROM: Full    Dental  (+) Teeth Intact, Dental Advisory Given   Pulmonary former smoker,    Pulmonary exam normal breath sounds clear to auscultation       Cardiovascular hypertension, Pt. on medications (-) angina(-) CAD, (-) Past MI and (-) CHF Normal cardiovascular exam Rhythm:Regular Rate:Normal     Neuro/Psych Anxiety negative neurological ROS     GI/Hepatic negative GI ROS, (+) Hepatitis -, CColon cancer   Endo/Other  negative endocrine ROSObesity   Renal/GU negative Renal ROS     Musculoskeletal negative musculoskeletal ROS (+)   Abdominal   Peds  Hematology  (+) Blood dyscrasia (Thrombocytopenia--Plt 96k), , Jehovah's witness-refuses blood products   Anesthesia Other Findings Day of surgery medications reviewed with the patient.  Reproductive/Obstetrics                            Anesthesia Physical Anesthesia Plan  ASA: III  Anesthesia Plan: General   Post-op Pain Management:    Induction: Intravenous  Airway Management Planned: Oral ETT  Additional Equipment:   Intra-op Plan:   Post-operative Plan: Extubation in OR  Informed Consent: I have reviewed the patients History and Physical, chart, labs and discussed the procedure including the risks, benefits and alternatives for the proposed anesthesia with the patient or authorized representative who has indicated his/her understanding and acceptance.   Dental advisory given  Plan Discussed with: CRNA  Anesthesia Plan Comments: (ERAS PROTOCOL. 2nd PIV for infusions.)        Anesthesia Quick Evaluation

## 2016-11-14 ENCOUNTER — Encounter (HOSPITAL_COMMUNITY)
Admission: RE | Disposition: A | Discharge status: Home / Self Care | Payer: Self-pay | Source: Ambulatory Visit | Attending: Surgery

## 2016-11-14 ENCOUNTER — Inpatient Hospital Stay (HOSPITAL_COMMUNITY)
Admission: RE | Admit: 2016-11-14 | Discharge: 2016-11-16 | DRG: 331 | Disposition: A | Discharge status: Home / Self Care | Payer: 59 | Source: Ambulatory Visit | Attending: Surgery | Admitting: Surgery

## 2016-11-14 ENCOUNTER — Encounter (HOSPITAL_COMMUNITY): Payer: Self-pay | Admitting: Certified Registered Nurse Anesthetist

## 2016-11-14 ENCOUNTER — Inpatient Hospital Stay (HOSPITAL_COMMUNITY): Payer: 59 | Admitting: Anesthesiology

## 2016-11-14 DIAGNOSIS — Z87891 Personal history of nicotine dependence: Secondary | ICD-10-CM | POA: Diagnosis not present

## 2016-11-14 DIAGNOSIS — I10 Essential (primary) hypertension: Secondary | ICD-10-CM | POA: Diagnosis not present

## 2016-11-14 DIAGNOSIS — K660 Peritoneal adhesions (postprocedural) (postinfection): Secondary | ICD-10-CM | POA: Diagnosis not present

## 2016-11-14 DIAGNOSIS — B182 Chronic viral hepatitis C: Secondary | ICD-10-CM | POA: Diagnosis present

## 2016-11-14 DIAGNOSIS — C186 Malignant neoplasm of descending colon: Secondary | ICD-10-CM | POA: Diagnosis not present

## 2016-11-14 DIAGNOSIS — B1920 Unspecified viral hepatitis C without hepatic coma: Secondary | ICD-10-CM | POA: Diagnosis not present

## 2016-11-14 DIAGNOSIS — F411 Generalized anxiety disorder: Secondary | ICD-10-CM | POA: Diagnosis present

## 2016-11-14 DIAGNOSIS — C187 Malignant neoplasm of sigmoid colon: Secondary | ICD-10-CM | POA: Diagnosis not present

## 2016-11-14 DIAGNOSIS — C772 Secondary and unspecified malignant neoplasm of intra-abdominal lymph nodes: Secondary | ICD-10-CM | POA: Diagnosis not present

## 2016-11-14 HISTORY — PX: PROCTOSCOPY: SHX2266

## 2016-11-14 HISTORY — PX: COLON SURGERY: SHX602

## 2016-11-14 SURGERY — COLECTOMY, PARTIAL, ROBOT-ASSISTED, LAPAROSCOPIC
Anesthesia: General | Site: Rectum

## 2016-11-14 MED ORDER — METOPROLOL TARTRATE 5 MG/5ML IV SOLN: 5.0000 mg | Freq: Four times a day (QID) | INTRAVENOUS | Status: DC | PRN

## 2016-11-14 MED ORDER — HYDROMORPHONE HCL 1 MG/ML IJ SOLN
0.2500 mg | INTRAMUSCULAR | Status: DC | PRN
  Administered 2016-11-14 (×2): 0.5 mg via INTRAVENOUS

## 2016-11-14 MED ORDER — ALVIMOPAN 12 MG PO CAPS
12.0000 mg | ORAL_CAPSULE | Freq: Two times a day (BID) | ORAL | Status: DC
  Administered 2016-11-15: 12 mg via ORAL
  Filled 2016-11-14: qty 1

## 2016-11-14 MED ORDER — MAGIC MOUTHWASH
15.0000 mL | Freq: Four times a day (QID) | ORAL | Status: DC | PRN
  Filled 2016-11-14: qty 15

## 2016-11-14 MED ORDER — CELECOXIB 200 MG PO CAPS
400.0000 mg | ORAL_CAPSULE | ORAL | Status: AC
  Administered 2016-11-14: 400 mg via ORAL
  Filled 2016-11-14: qty 2

## 2016-11-14 MED ORDER — BUPIVACAINE HCL (PF) 0.25 % IJ SOLN
INTRAMUSCULAR | Status: DC | PRN
  Administered 2016-11-14: 30 mL

## 2016-11-14 MED ORDER — MENTHOL 3 MG MT LOZG: 1.0000 | LOZENGE | OROMUCOSAL | Status: DC | PRN

## 2016-11-14 MED ORDER — ONDANSETRON HCL 4 MG/2ML IJ SOLN
INTRAMUSCULAR | Status: DC | PRN
  Administered 2016-11-14: 4 mg via INTRAVENOUS

## 2016-11-14 MED ORDER — ENOXAPARIN SODIUM 40 MG/0.4ML ~~LOC~~ SOLN: 40.0000 mg | SUBCUTANEOUS | Status: DC

## 2016-11-14 MED ORDER — DEXAMETHASONE SODIUM PHOSPHATE 10 MG/ML IJ SOLN
INTRAMUSCULAR | Status: AC
  Filled 2016-11-14: qty 1

## 2016-11-14 MED ORDER — MIDAZOLAM HCL 5 MG/5ML IJ SOLN
INTRAMUSCULAR | Status: DC | PRN
  Administered 2016-11-14 (×2): 1 mg via INTRAVENOUS

## 2016-11-14 MED ORDER — PHENYLEPHRINE HCL 10 MG/ML IJ SOLN
INTRAMUSCULAR | Status: DC | PRN
  Administered 2016-11-14 (×5): 40 ug via INTRAVENOUS

## 2016-11-14 MED ORDER — DEXAMETHASONE SODIUM PHOSPHATE 10 MG/ML IJ SOLN
INTRAMUSCULAR | Status: DC | PRN
Start: 2016-11-14 — End: 2016-11-14
  Administered 2016-11-14: 10 mg via INTRAVENOUS

## 2016-11-14 MED ORDER — GENTAMICIN IN SALINE 1-0.9 MG/ML-% IV SOLN: 100.0000 mg | INTRAVENOUS | Status: DC

## 2016-11-14 MED ORDER — METHOCARBAMOL 1000 MG/10ML IJ SOLN: 1000.0000 mg | Freq: Four times a day (QID) | INTRAVENOUS | Status: DC | PRN

## 2016-11-14 MED ORDER — LORATADINE 10 MG PO TABS
10.0000 mg | ORAL_TABLET | Freq: Every day | ORAL | Status: DC
  Administered 2016-11-14 – 2016-11-16 (×3): 10 mg via ORAL
  Filled 2016-11-14 (×3): qty 1

## 2016-11-14 MED ORDER — POLYETHYLENE GLYCOL 3350 17 GM/SCOOP PO POWD: 1.0000 | Freq: Once | ORAL | Status: DC

## 2016-11-14 MED ORDER — BUPIVACAINE LIPOSOME 1.3 % IJ SUSP
20.0000 mL | INTRAMUSCULAR | Status: DC
  Filled 2016-11-14: qty 20

## 2016-11-14 MED ORDER — METOCLOPRAMIDE HCL 5 MG/ML IJ SOLN: 5.0000 mg | Freq: Four times a day (QID) | INTRAMUSCULAR | Status: DC | PRN

## 2016-11-14 MED ORDER — CEFOTETAN DISODIUM 2 G IJ SOLR
2.0000 g | INTRAMUSCULAR | Status: AC
  Administered 2016-11-14: 2 g via INTRAVENOUS

## 2016-11-14 MED ORDER — DEXTROSE 5 % IV SOLN
2.0000 g | Freq: Two times a day (BID) | INTRAVENOUS | Status: AC
  Administered 2016-11-14: 2 g via INTRAVENOUS
  Filled 2016-11-14: qty 2

## 2016-11-14 MED ORDER — ENALAPRILAT 1.25 MG/ML IV SOLN
0.6250 mg | Freq: Four times a day (QID) | INTRAVENOUS | Status: DC | PRN
  Filled 2016-11-14: qty 1

## 2016-11-14 MED ORDER — SACCHAROMYCES BOULARDII 250 MG PO CAPS
250.0000 mg | ORAL_CAPSULE | Freq: Two times a day (BID) | ORAL | Status: DC
  Administered 2016-11-14 – 2016-11-16 (×4): 250 mg via ORAL
  Filled 2016-11-14 (×4): qty 1

## 2016-11-14 MED ORDER — SUCCINYLCHOLINE CHLORIDE 20 MG/ML IJ SOLN
INTRAMUSCULAR | Status: DC | PRN
  Administered 2016-11-14: 140 mg via INTRAVENOUS

## 2016-11-14 MED ORDER — BUPIVACAINE HCL (PF) 0.25 % IJ SOLN
INTRAMUSCULAR | Status: AC
  Filled 2016-11-14: qty 30

## 2016-11-14 MED ORDER — SODIUM CHLORIDE 0.9 % IV SOLN
INTRAVENOUS | Status: DC
  Filled 2016-11-14: qty 6

## 2016-11-14 MED ORDER — KETAMINE HCL 10 MG/ML IJ SOLN
INTRAMUSCULAR | Status: AC
  Filled 2016-11-14: qty 1

## 2016-11-14 MED ORDER — TRAMADOL HCL 50 MG PO TABS: 50.0000 mg | ORAL_TABLET | Freq: Four times a day (QID) | ORAL | 0 refills | Status: DC | PRN

## 2016-11-14 MED ORDER — LACTATED RINGERS IR SOLN
Status: DC | PRN
  Administered 2016-11-14: 3000 mL

## 2016-11-14 MED ORDER — FENTANYL CITRATE (PF) 100 MCG/2ML IJ SOLN
INTRAMUSCULAR | Status: DC | PRN
Start: 2016-11-14 — End: 2016-11-14
  Administered 2016-11-14 (×3): 50 ug via INTRAVENOUS

## 2016-11-14 MED ORDER — KETAMINE HCL 10 MG/ML IJ SOLN
INTRAMUSCULAR | Status: DC | PRN
  Administered 2016-11-14: 40 mg via INTRAVENOUS

## 2016-11-14 MED ORDER — ROCURONIUM BROMIDE 50 MG/5ML IV SOSY
PREFILLED_SYRINGE | INTRAVENOUS | Status: AC
  Filled 2016-11-14: qty 5

## 2016-11-14 MED ORDER — LACTATED RINGERS IV BOLUS (SEPSIS): 1000.0000 mL | Freq: Three times a day (TID) | INTRAVENOUS | Status: DC | PRN

## 2016-11-14 MED ORDER — SODIUM CHLORIDE 0.9 % IJ SOLN
INTRAMUSCULAR | Status: AC
  Filled 2016-11-14: qty 20

## 2016-11-14 MED ORDER — CLINDAMYCIN PHOSPHATE 600 MG/50ML IV SOLN: 600.0000 mg | INTRAVENOUS | Status: DC

## 2016-11-14 MED ORDER — 0.9 % SODIUM CHLORIDE (POUR BTL) OPTIME
TOPICAL | Status: DC | PRN
  Administered 2016-11-14: 1000 mL

## 2016-11-14 MED ORDER — METHOCARBAMOL 1000 MG/10ML IJ SOLN
1000.0000 mg | Freq: Four times a day (QID) | INTRAVENOUS | Status: DC | PRN
  Administered 2016-11-14: 1000 mg via INTRAVENOUS
  Filled 2016-11-14 (×2): qty 10

## 2016-11-14 MED ORDER — ONDANSETRON HCL 4 MG/2ML IJ SOLN
INTRAMUSCULAR | Status: AC
  Filled 2016-11-14: qty 2

## 2016-11-14 MED ORDER — BUPIVACAINE LIPOSOME 1.3 % IJ SUSP
INTRAMUSCULAR | Status: DC | PRN
  Administered 2016-11-14: 20 mL

## 2016-11-14 MED ORDER — PHENYLEPHRINE HCL 10 MG/ML IJ SOLN
INTRAVENOUS | Status: DC | PRN
  Administered 2016-11-14: 20 ug/min via INTRAVENOUS

## 2016-11-14 MED ORDER — ROCURONIUM BROMIDE 100 MG/10ML IV SOLN
INTRAVENOUS | Status: DC | PRN
  Administered 2016-11-14: 50 mg via INTRAVENOUS
  Administered 2016-11-14: 30 mg via INTRAVENOUS
  Administered 2016-11-14: 10 mg via INTRAVENOUS

## 2016-11-14 MED ORDER — PROPOFOL 10 MG/ML IV BOLUS
INTRAVENOUS | Status: AC
  Filled 2016-11-14: qty 20

## 2016-11-14 MED ORDER — ALVIMOPAN 12 MG PO CAPS
12.0000 mg | ORAL_CAPSULE | Freq: Once | ORAL | Status: AC
  Administered 2016-11-14: 12 mg via ORAL
  Filled 2016-11-14: qty 1

## 2016-11-14 MED ORDER — LIDOCAINE 2% (20 MG/ML) 5 ML SYRINGE
INTRAMUSCULAR | Status: DC | PRN
  Administered 2016-11-14: 1.5 mg/kg/h via INTRAVENOUS

## 2016-11-14 MED ORDER — EPHEDRINE SULFATE 50 MG/ML IJ SOLN
INTRAMUSCULAR | Status: DC | PRN
  Administered 2016-11-14: 10 mg via INTRAVENOUS

## 2016-11-14 MED ORDER — PROCHLORPERAZINE EDISYLATE 5 MG/ML IJ SOLN
10.0000 mg | Freq: Four times a day (QID) | INTRAMUSCULAR | Status: DC | PRN
  Administered 2016-11-14: 10 mg via INTRAVENOUS
  Filled 2016-11-14: qty 2

## 2016-11-14 MED ORDER — LACTATED RINGERS IV SOLN
INTRAVENOUS | Status: DC | PRN
  Administered 2016-11-14 (×2): via INTRAVENOUS

## 2016-11-14 MED ORDER — ALUM & MAG HYDROXIDE-SIMETH 200-200-20 MG/5ML PO SUSP: 30.0000 mL | Freq: Four times a day (QID) | ORAL | Status: DC | PRN

## 2016-11-14 MED ORDER — DIPHENHYDRAMINE HCL 50 MG/ML IJ SOLN: 12.5000 mg | Freq: Four times a day (QID) | INTRAMUSCULAR | Status: DC | PRN

## 2016-11-14 MED ORDER — METHOCARBAMOL 500 MG PO TABS: 1000.0000 mg | ORAL_TABLET | Freq: Four times a day (QID) | ORAL | Status: DC | PRN

## 2016-11-14 MED ORDER — PROPOFOL 10 MG/ML IV BOLUS
INTRAVENOUS | Status: DC | PRN
  Administered 2016-11-14: 160 mg via INTRAVENOUS

## 2016-11-14 MED ORDER — LIDOCAINE 2% (20 MG/ML) 5 ML SYRINGE
INTRAMUSCULAR | Status: AC
  Filled 2016-11-14: qty 20

## 2016-11-14 MED ORDER — CEFOTETAN DISODIUM-DEXTROSE 2-2.08 GM-% IV SOLR
INTRAVENOUS | Status: AC
  Filled 2016-11-14: qty 50

## 2016-11-14 MED ORDER — DIPHENHYDRAMINE HCL 12.5 MG/5ML PO ELIX: 12.5000 mg | ORAL_SOLUTION | Freq: Four times a day (QID) | ORAL | Status: DC | PRN

## 2016-11-14 MED ORDER — FENTANYL CITRATE (PF) 100 MCG/2ML IJ SOLN: 25.0000 ug | INTRAMUSCULAR | Status: DC | PRN

## 2016-11-14 MED ORDER — GABAPENTIN 300 MG PO CAPS
300.0000 mg | ORAL_CAPSULE | ORAL | Status: AC
  Administered 2016-11-14: 300 mg via ORAL
  Filled 2016-11-14: qty 1

## 2016-11-14 MED ORDER — SUGAMMADEX SODIUM 500 MG/5ML IV SOLN
INTRAVENOUS | Status: DC | PRN
  Administered 2016-11-14: 300 mg via INTRAVENOUS

## 2016-11-14 MED ORDER — ENOXAPARIN SODIUM 40 MG/0.4ML ~~LOC~~ SOLN
40.0000 mg | SUBCUTANEOUS | Status: DC
  Administered 2016-11-16: 40 mg via SUBCUTANEOUS
  Filled 2016-11-14: qty 0.4

## 2016-11-14 MED ORDER — VITAMIN C 500 MG PO TABS
500.0000 mg | ORAL_TABLET | Freq: Two times a day (BID) | ORAL | Status: DC
  Administered 2016-11-14 – 2016-11-16 (×4): 500 mg via ORAL
  Filled 2016-11-14 (×4): qty 1

## 2016-11-14 MED ORDER — LIDOCAINE HCL (CARDIAC) 20 MG/ML IV SOLN
INTRAVENOUS | Status: DC | PRN
  Administered 2016-11-14: 80 mg via INTRAVENOUS

## 2016-11-14 MED ORDER — METHOCARBAMOL 500 MG PO TABS
1000.0000 mg | ORAL_TABLET | Freq: Four times a day (QID) | ORAL | Status: DC | PRN
  Administered 2016-11-15 (×2): 1000 mg via ORAL
  Filled 2016-11-14 (×2): qty 2

## 2016-11-14 MED ORDER — ACETAMINOPHEN 500 MG PO TABS
1000.0000 mg | ORAL_TABLET | ORAL | Status: AC
  Administered 2016-11-14: 1000 mg via ORAL
  Filled 2016-11-14: qty 2

## 2016-11-14 MED ORDER — HYDROMORPHONE HCL 1 MG/ML IJ SOLN
INTRAMUSCULAR | Status: AC
  Filled 2016-11-14: qty 1

## 2016-11-14 MED ORDER — LACTATED RINGERS IV SOLN
INTRAVENOUS | Status: DC | PRN
  Administered 2016-11-14 (×2): via INTRAVENOUS

## 2016-11-14 MED ORDER — MIDAZOLAM HCL 2 MG/2ML IJ SOLN
INTRAMUSCULAR | Status: AC
  Filled 2016-11-14: qty 2

## 2016-11-14 MED ORDER — FENTANYL CITRATE (PF) 250 MCG/5ML IJ SOLN
INTRAMUSCULAR | Status: AC
  Filled 2016-11-14: qty 5

## 2016-11-14 MED ORDER — LIP MEDEX EX OINT
1.0000 "application " | TOPICAL_OINTMENT | Freq: Two times a day (BID) | CUTANEOUS | Status: DC
  Administered 2016-11-14 – 2016-11-15 (×3): 1 via TOPICAL
  Filled 2016-11-14: qty 7

## 2016-11-14 MED ORDER — LACTATED RINGERS IV SOLN
INTRAVENOUS | Status: DC
  Administered 2016-11-14 – 2016-11-16 (×3): via INTRAVENOUS

## 2016-11-14 MED ORDER — ONDANSETRON HCL 4 MG/2ML IJ SOLN: 4.0000 mg | Freq: Once | INTRAMUSCULAR | Status: DC | PRN

## 2016-11-14 MED ORDER — SUGAMMADEX SODIUM 200 MG/2ML IV SOLN
INTRAVENOUS | Status: AC
  Filled 2016-11-14: qty 2

## 2016-11-14 MED ORDER — ENOXAPARIN SODIUM 40 MG/0.4ML ~~LOC~~ SOLN
40.0000 mg | Freq: Once | SUBCUTANEOUS | Status: AC
  Administered 2016-11-14: 40 mg via SUBCUTANEOUS
  Filled 2016-11-14: qty 0.4

## 2016-11-14 MED ORDER — HYDRALAZINE HCL 20 MG/ML IJ SOLN: 5.0000 mg | Freq: Four times a day (QID) | INTRAMUSCULAR | Status: DC | PRN

## 2016-11-14 MED ORDER — ACETAMINOPHEN 500 MG PO TABS
1000.0000 mg | ORAL_TABLET | Freq: Three times a day (TID) | ORAL | Status: DC
  Administered 2016-11-14 – 2016-11-16 (×6): 1000 mg via ORAL
  Filled 2016-11-14 (×6): qty 2

## 2016-11-14 MED ORDER — PHENOL 1.4 % MT LIQD: 2.0000 | OROMUCOSAL | Status: DC | PRN

## 2016-11-14 MED ORDER — FENTANYL CITRATE (PF) 100 MCG/2ML IJ SOLN
12.5000 ug | INTRAMUSCULAR | Status: DC | PRN
  Administered 2016-11-15 – 2016-11-16 (×2): 12.5 ug via INTRAVENOUS
  Filled 2016-11-14 (×2): qty 2

## 2016-11-14 MED ORDER — BENAZEPRIL HCL 20 MG PO TABS
20.0000 mg | ORAL_TABLET | Freq: Every day | ORAL | Status: DC
  Administered 2016-11-14: 20 mg via ORAL
  Administered 2016-11-15: 10 mg via ORAL
  Administered 2016-11-16: 20 mg via ORAL
  Filled 2016-11-14 (×4): qty 1

## 2016-11-14 MED ORDER — LIDOCAINE 2% (20 MG/ML) 5 ML SYRINGE
INTRAMUSCULAR | Status: AC
  Filled 2016-11-14: qty 5

## 2016-11-14 MED ORDER — GENTAMICIN SULFATE 40 MG/ML IJ SOLN
INTRAMUSCULAR | Status: DC | PRN
  Administered 2016-11-14: 13:00:00 via INTRAPERITONEAL

## 2016-11-14 SURGICAL SUPPLY — 102 items
APPLIER CLIP 5 13 M/L LIGAMAX5 (MISCELLANEOUS)
APPLIER CLIP ROT 10 11.4 M/L (STAPLE)
BLADE EXTENDED COATED 6.5IN (ELECTRODE) IMPLANT
CANNULA REDUC XI 12-8 STAPL (CANNULA) ×1
CANNULA REDUC XI 12-8MM STAPL (CANNULA) ×1
CANNULA REDUCER 12-8 DVNC XI (CANNULA) ×2 IMPLANT
CELLS DAT CNTRL 66122 CELL SVR (MISCELLANEOUS) IMPLANT
CHLORAPREP W/TINT 26ML (MISCELLANEOUS) ×4 IMPLANT
CLIP APPLIE 5 13 M/L LIGAMAX5 (MISCELLANEOUS) IMPLANT
CLIP APPLIE ROT 10 11.4 M/L (STAPLE) IMPLANT
CLIP LIGATING HEM O LOK PURPLE (MISCELLANEOUS) IMPLANT
CLIP LIGATING HEMO O LOK GREEN (MISCELLANEOUS) IMPLANT
COUNTER NEEDLE 20 DBL MAG RED (NEEDLE) ×4 IMPLANT
COVER MAYO STAND STRL (DRAPES) ×12 IMPLANT
COVER TIP SHEARS 8 DVNC (MISCELLANEOUS) ×2 IMPLANT
COVER TIP SHEARS 8MM DA VINCI (MISCELLANEOUS) ×2
DECANTER SPIKE VIAL GLASS SM (MISCELLANEOUS) ×4 IMPLANT
DEVICE TROCAR PUNCTURE CLOSURE (ENDOMECHANICALS) IMPLANT
DRAIN CHANNEL 19F RND (DRAIN) IMPLANT
DRAPE ARM DVNC X/XI (DISPOSABLE) ×8 IMPLANT
DRAPE COLUMN DVNC XI (DISPOSABLE) ×2 IMPLANT
DRAPE DA VINCI XI ARM (DISPOSABLE) ×8
DRAPE DA VINCI XI COLUMN (DISPOSABLE) ×2
DRAPE SURG IRRIG POUCH 19X23 (DRAPES) ×4 IMPLANT
DRSG OPSITE POSTOP 4X10 (GAUZE/BANDAGES/DRESSINGS) IMPLANT
DRSG OPSITE POSTOP 4X6 (GAUZE/BANDAGES/DRESSINGS) ×4 IMPLANT
DRSG OPSITE POSTOP 4X8 (GAUZE/BANDAGES/DRESSINGS) IMPLANT
DRSG TEGADERM 2-3/8X2-3/4 SM (GAUZE/BANDAGES/DRESSINGS) ×4 IMPLANT
DRSG TEGADERM 4X4.75 (GAUZE/BANDAGES/DRESSINGS) ×4 IMPLANT
ELECT PENCIL ROCKER SW 15FT (MISCELLANEOUS) ×8 IMPLANT
ELECT REM PT RETURN 15FT ADLT (MISCELLANEOUS) ×4 IMPLANT
ENDOLOOP SUT PDS II  0 18 (SUTURE)
ENDOLOOP SUT PDS II 0 18 (SUTURE) IMPLANT
EVACUATOR SILICONE 100CC (DRAIN) IMPLANT
GAUZE SPONGE 2X2 8PLY STRL LF (GAUZE/BANDAGES/DRESSINGS) ×2 IMPLANT
GAUZE SPONGE 4X4 12PLY STRL (GAUZE/BANDAGES/DRESSINGS) IMPLANT
GLOVE ECLIPSE 8.0 STRL XLNG CF (GLOVE) ×12 IMPLANT
GLOVE INDICATOR 8.0 STRL GRN (GLOVE) ×12 IMPLANT
GOWN STRL REUS W/TWL XL LVL3 (GOWN DISPOSABLE) ×20 IMPLANT
GRASPER ENDOPATH ANVIL 10MM (MISCELLANEOUS) IMPLANT
HOLDER FOLEY CATH W/STRAP (MISCELLANEOUS) ×4 IMPLANT
IRRIG SUCT STRYKERFLOW 2 WTIP (MISCELLANEOUS) ×4
IRRIGATION SUCT STRKRFLW 2 WTP (MISCELLANEOUS) ×2 IMPLANT
KIT PROCEDURE DA VINCI SI (MISCELLANEOUS) ×2
KIT PROCEDURE DVNC SI (MISCELLANEOUS) ×2 IMPLANT
LEGGING LITHOTOMY PAIR STRL (DRAPES) ×4 IMPLANT
LUBRICANT JELLY K Y 4OZ (MISCELLANEOUS) ×4 IMPLANT
NEEDLE INSUFFLATION 14GA 120MM (NEEDLE) ×4 IMPLANT
PACK CARDIOVASCULAR III (CUSTOM PROCEDURE TRAY) ×4 IMPLANT
PACK COLON (CUSTOM PROCEDURE TRAY) ×4 IMPLANT
PAD POSITIONING PINK XL (MISCELLANEOUS) ×4 IMPLANT
PORT LAP GEL ALEXIS MED 5-9CM (MISCELLANEOUS) ×4 IMPLANT
RTRCTR WOUND ALEXIS 18CM MED (MISCELLANEOUS)
SCISSORS LAP 5X35 DISP (ENDOMECHANICALS) ×4 IMPLANT
SEAL CANN UNIV 5-8 DVNC XI (MISCELLANEOUS) ×8 IMPLANT
SEAL XI 5MM-8MM UNIVERSAL (MISCELLANEOUS) ×8
SEALER VESSEL DA VINCI XI (MISCELLANEOUS) ×2
SEALER VESSEL EXT DVNC XI (MISCELLANEOUS) ×2 IMPLANT
SET BI-LUMEN FLTR TB AIRSEAL (TUBING) ×4 IMPLANT
SET IRRIG TUBING LAPAROSCOPIC (IRRIGATION / IRRIGATOR) ×4 IMPLANT
SLEEVE ADV FIXATION 5X100MM (TROCAR) ×4 IMPLANT
SOLUTION ELECTROLUBE (MISCELLANEOUS) ×4 IMPLANT
SPONGE GAUZE 2X2 STER 10/PKG (GAUZE/BANDAGES/DRESSINGS) ×2
STAPLER 45 BLU RELOAD XI (STAPLE) IMPLANT
STAPLER 45 BLUE RELOAD XI (STAPLE)
STAPLER 45 GREEN RELOAD XI (STAPLE)
STAPLER 45 GRN RELOAD XI (STAPLE) IMPLANT
STAPLER CANNULA SEAL DVNC XI (STAPLE) ×2 IMPLANT
STAPLER CANNULA SEAL XI (STAPLE) ×2
STAPLER CIRC ILS CVD 33MM 37CM (STAPLE) ×4 IMPLANT
STAPLER SHEATH (SHEATH) ×2
STAPLER SHEATH ENDOWRIST DVNC (SHEATH) ×2 IMPLANT
SUT MNCRL AB 4-0 PS2 18 (SUTURE) ×4 IMPLANT
SUT PDS AB 1 CT1 27 (SUTURE) ×4 IMPLANT
SUT PDS AB 1 CTX 36 (SUTURE) ×8 IMPLANT
SUT PDS AB 1 TP1 96 (SUTURE) IMPLANT
SUT PDS AB 2-0 CT2 27 (SUTURE) IMPLANT
SUT PROLENE 0 CT 2 (SUTURE) ×4 IMPLANT
SUT PROLENE 2 0 SH DA (SUTURE) IMPLANT
SUT SILK 2 0 (SUTURE) ×2
SUT SILK 2 0 SH CR/8 (SUTURE) ×4 IMPLANT
SUT SILK 2-0 18XBRD TIE 12 (SUTURE) ×2 IMPLANT
SUT SILK 3 0 (SUTURE) ×2
SUT SILK 3 0 SH CR/8 (SUTURE) ×4 IMPLANT
SUT SILK 3-0 18XBRD TIE 12 (SUTURE) ×2 IMPLANT
SUT V-LOC BARB 180 2/0GR6 GS22 (SUTURE)
SUT VIC AB 0 UR5 27 (SUTURE) ×4 IMPLANT
SUT VIC AB 3-0 SH 18 (SUTURE) IMPLANT
SUT VIC AB 3-0 SH 27 (SUTURE)
SUT VIC AB 3-0 SH 27XBRD (SUTURE) IMPLANT
SUT VICRYL 0 UR6 27IN ABS (SUTURE) ×8 IMPLANT
SUTURE V-LC BRB 180 2/0GR6GS22 (SUTURE) IMPLANT
SYR 10ML LL (SYRINGE) ×4 IMPLANT
SYS LAPSCP GELPORT 120MM (MISCELLANEOUS)
SYSTEM LAPSCP GELPORT 120MM (MISCELLANEOUS) IMPLANT
TAPE UMBILICAL COTTON 1/8X30 (MISCELLANEOUS) ×4 IMPLANT
TOWEL OR 17X26 10 PK STRL BLUE (TOWEL DISPOSABLE) IMPLANT
TOWEL OR NON WOVEN STRL DISP B (DISPOSABLE) ×4 IMPLANT
TRAY FOLEY W/METER SILVER 16FR (SET/KITS/TRAYS/PACK) ×4 IMPLANT
TROCAR ADV FIXATION 5X100MM (TROCAR) IMPLANT
TUBING CONNECTING 10 (TUBING) IMPLANT
TUBING CONNECTING 10' (TUBING)

## 2016-11-14 NOTE — Anesthesia Procedure Notes (Signed)
Procedure Name: Intubation Date/Time: 11/14/2016 8:36 AM Performed by: Catalina Gravel Pre-anesthesia Checklist: Patient identified, Timeout performed, Emergency Drugs available, Suction available and Patient being monitored Patient Re-evaluated:Patient Re-evaluated prior to inductionOxygen Delivery Method: Circle system utilized Preoxygenation: Pre-oxygenation with 100% oxygen Intubation Type: IV induction and Cricoid Pressure applied Ventilation: Mask ventilation without difficulty Laryngoscope Size: Mac and 4 Grade View: Grade III Tube type: Oral Tube size: 7.5 mm Number of attempts: 1 Airway Equipment and Method: Stylet Placement Confirmation: positive ETCO2 and breath sounds checked- equal and bilateral Secured at: 21 cm Tube secured with: Tape Dental Injury: Teeth and Oropharynx as per pre-operative assessment  Difficulty Due To: Difficulty was anticipated, Difficult Airway- due to large tongue and Difficult Airway- due to anterior larynx Future Recommendations: Recommend- induction with short-acting agent, and alternative techniques readily available Comments: Arytenoids only visualized with head lift/cricoid press.  Anterior.

## 2016-11-14 NOTE — Interval H&P Note (Signed)
History and Physical Interval Note:  11/14/2016 8:14 AM  Jermaine Moody  has presented today for surgery, with the diagnosis of COLON CANCER  The various methods of treatment have been discussed with the patient and family. After consideration of risks, benefits and other options for treatment, the patient has consented to  Procedure(s): XI ROBOT ASSISTED LAPAROSCOPIC RESECTION OF DESCENDING AND SIGMOID COLON. LYSIS OF ADHESIONS. POSSIBLE SPLENIC FLEXURE MOBILIZATION. (N/A) RIGID PROCTOSCOPY (N/A) as a surgical intervention .  The patient's history has been reviewed, patient examined, no change in status, stable for surgery.  I have reviewed the patient's chart and labs.  Questions were answered to the patient's satisfaction.     Kizer Nobbe C.

## 2016-11-14 NOTE — Progress Notes (Signed)
Pt stated his wound was bleeding, when I went to change his dressing, his incisions were covered with bright red blood. I cleaned him off and changed 3/6 sites including honeycomb dressings. Wicks were still intact. Bleeding stopped and Dr was notified. Ice was applied to leaking site (top right) per Dr's order.  [place]

## 2016-11-14 NOTE — Transfer of Care (Signed)
Immediate Anesthesia Transfer of Care Note  Patient: Jermaine Moody  Procedure(s) Performed: Procedure(s): XI ROBOT ASSISTED LAPAROSCOPIC LEFT COLON. OF DESCENDING AND SIGMOID COLON. LYSIS OF ADHESIONS. SPLENIC FLEXURE MOBILIZATION. (N/A) RIGID PROCTOSCOPY (N/A)  Patient Location: PACU  Anesthesia Type:General  Level of Consciousness: awake, oriented, patient cooperative, lethargic and responds to stimulation  Airway & Oxygen Therapy: Patient Spontanous Breathing and Patient connected to face mask oxygen  Post-op Assessment: Report given to RN, Post -op Vital signs reviewed and stable and Patient moving all extremities  Post vital signs: Reviewed and stable  Last Vitals:  Vitals:   11/14/16 0650  BP: 137/87  Pulse: 80  Resp: 18  Temp: 36.5 C    Last Pain:  Vitals:   11/14/16 0650  TempSrc: Oral      Patients Stated Pain Goal: 4 ([id number])  Complications: No apparent anesthesia complications
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
## 2016-11-14 NOTE — Anesthesia Postprocedure Evaluation (Signed)
Anesthesia Post Note  Patient: Jermaine Moody  Procedure(s) Performed: Procedure(s) (LRB): XI ROBOT ASSISTED LAPAROSCOPIC LEFT COLON. OF DESCENDING AND SIGMOID COLON. LYSIS OF ADHESIONS. SPLENIC FLEXURE MOBILIZATION. (N/A) RIGID PROCTOSCOPY (N/A)  Patient location during evaluation: PACU Anesthesia Type: General Level of consciousness: awake and alert Pain management: pain level controlled Vital Signs Assessment: post-procedure vital signs reviewed and stable Respiratory status: spontaneous breathing, nonlabored ventilation, respiratory function stable and patient connected to nasal cannula oxygen Cardiovascular status: blood pressure returned to baseline and stable Postop Assessment: no signs of nausea or vomiting Anesthetic complications: no       Last Vitals:  Vitals:   11/14/16 1500 11/14/16 1515  BP:  (!) 137/95  Pulse:  69  Resp:  15  Temp: 36.7 C 36.7 C    Last Pain:  Vitals:   11/14/16 1536  TempSrc:   PainSc: [place]
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
## 2016-11-14 NOTE — Op Note (Signed)
11/14/2016  1:29 PM  PATIENT:  Jermaine Moody  66 y.o. male  Patient Care Team: Iona Beard, MD as PCP - General (Family Medicine) Michael Boston, MD as Consulting Physician (General Surgery) Manus Gunning, MD as Consulting Physician (Gastroenterology)  PRE-OPERATIVE DIAGNOSIS:  COLON CANCER of left colon  POST-OPERATIVE DIAGNOSIS:  COLON CANCER of distal desecnding colon  PROCEDURE:   XI ROBOT ASSISTED LEFT COLECTOMY (DESCENDING AND SIGMOID COLON) LYSIS OF ADHESIONS MOBILIZATION OF SPLENIC FLEXURE OF COLON RIGID PROCTOSCOPY  SURGEON:  Adin Hector, MD  ASSISTANT: Gurney Maxin, MD Edward Qualia, PA-S, [facility]   ANESTHESIA:   local and general  EBL:  Total I/O In: 85 [I.V.:1900] Out: 150 [Urine:125; Blood:25]  Delay start of Pharmacological VTE agent (>24hrs) due to surgical blood loss or risk of bleeding:  no  DRAINS: none   SPECIMEN:  Left distal colon.  Open end proximal.  Tattoo marks proximal and distal ends of the tumor  Anastomotic rings.  Blue stitch and proximal ring.  DISPOSITION OF SPECIMEN:  PATHOLOGY  COUNTS:  YES  PLAN OF CARE: Admit to inpatient   PATIENT DISPOSITION:  PACU - hemodynamically stable.  INDICATION:    Patient found to have numerous polyps on colonoscopy.  More suspicious lesion biopsied in the descending colon.  Came back consistent with adenocarcinoma.  I recommended segmental resection:  The anatomy & physiology of the digestive tract was discussed.  The pathophysiology was discussed.  Natural history risks without surgery was discussed.   I worked to give an overview of the disease and the frequent need to have multispecialty involvement.  I feel the risks of no intervention will lead to serious problems that outweigh the operative risks; therefore, I recommended a partial colectomy to remove the pathology.  Laparoscopic & open techniques were discussed.   Risks such as bleeding, infection, abscess, leak,  reoperation, possible ostomy, hernia, heart attack, death, and other risks were discussed.  I noted a good likelihood this will help address the problem.   Goals of post-operative recovery were discussed as well.  We will work to minimize complications.  Educational materials on the pathology had been given in the office.  Questions were answered.    The patient expressed understanding & wished to proceed with surgery.  OR FINDINGS:   Patient had redundant left rectosigmoid colon.  Tattoo and distal descending colon proximal plane distally. He has a double left ureter.  No obvious metastatic disease on visceral parietal peritoneum or liver.  The colorectal anastomosis rests 15 cm from the anal verge by rigid proctoscopy.  DESCRIPTION:   Informed consent was confirmed.  The patient underwent general anaesthesia without difficulty.  The patient was positioned appropriately.  VTE prevention in place.  The patient's abdomen was clipped, prepped, & draped in a sterile fashion.  Surgical timeout confirmed our plan.  The patient was positioned in reverse Trendelenburg.  Abdominal entry was gained using Varess technique with a trach hook on the anterior abdominal wall fascia in the left upper abdomen.  Entry was clean.  I induced carbon dioxide insufflation.  Camera inspection revealed no injury.  Extra ports were carefully placed under direct laparoscopic visualization. I did lyse adhesions to free omentum and small bowel off the anterior midline.  He had very dense adhesions in the right upper quadrant.  Carefully freed those adhesions of small bowel greater omentum and some transverse colon to better expose the right mid abdomen and right upper quadrant.  The patient was  carefully positioned.  The [employer] daVinci robot was carefully docked with camera & instruments carefully placed.  I mobilized the ileocecal region and a lateral to medial fashion to help mobilize a loop small bowel in the pelvis to  better expose the rectosigmoid region.  The lyse some adhesions off the redundant rectosigmoid colon to help straighten it out.  I elevated the mesentery to identify the IMA/IMV pedicles.  I scored the base of peritoneum of the medial side of the mesentery of the left colon from the ligament of Treitz to the peritoneal reflection of the mid rectum.   I elevated the sigmoid mesentery and entered into the retro-mesenteric plane. We were able to identify the left ureter (he had a double ureter on the left side) a and gonadal vessels. We kept those posterior within the retroperitoneum and elevated the left colon mesentery off that. I did isolate the inferior mesenteric artery (IMA) pedicle but did not ligate it yet.  I continued distally and got into the avascular plane posterior to the mesorectum. This allowed me to help mobilize the rectum as well by freeing the mesorectum off the sacrum.  I mobilized the peritoneal coverings towards the peritoneal reflection on both the right and left sides of the rectum.  I stayed away from the right and left ureters.  I kept the lateral vascular pedicles to the rectum intact.  I skeletonized the lymph nodes off the inferior mesenteric artery pedicle.  I went down to its takeoff from the aorta.  I isolated the inferior mesenteric vein off of the ligament of Treitz just cephalad to that as well.  After confirming the left ureter was out of the way, I went ahead and ligated the inferior mesenteric artery pedicle just near its takeoff from the aorta.  I did ligate the inferior mesenteric vein in a similar fashion.  We ensured hemostasis. I skeletonized the mesorectum at the junction at the proximal rectum for the distal point of resection.  I mobilized the left colon in a lateral to medial fashion off the line of Toldt up towards the splenic flexure to ensure good mobilization of the remaining left colon to reach into the pelvis.  He would not reach well, however.  Therefore we  proceeded with splenic flexure mobilization.  We undocked the robot.  Reposition the patient in reverse Trendelenburg left side up.  We docked the robot.  I mobilized some greater omentum off the wall.  Came between the rater omentum and transverse colon at the midpoint.  Continent to the lesser sac.  Follow that more distally.  Freed the distal transverse colon off its attachments to the retroperitoneum along the inferior pancreatic edge.  Eventually came around the splenic flexure.  Further elevated the descending colon off as well.  Came from the descending colon more proximally.  Eventually was able to mobilize splenic flexure off its superior, lateral, and retroperitoneal attachments to the mobility.  Repositioned & redocked for pelvic dissection.    I skeletonized at the proximal mesorectum and transected at the proximal rectum using a robotic 45 mm stapler.  I chose a region at the descending/sigmoid junction that was soft and easily reached down to the rectal stump.  I transected the mesentery of the colon radially to preserve remaining colon blood supply.  I made a small Pfannenstiel incisionr.  I was able to eviscerate the rectosigmoid and descending colon out the wound.   I chose a location in the mid descending colon.  I placed  a pursestring applier.  Passed a 2-0 Prolene suture through a [place] needle around it.  Transected the specimen off this with scissors.   I got healthy bleeding mucosa.  We sent the rectosigmoid colon specimen off to go to pathology.  Secured around the pursestring with interrupted silk sutures.  We sized the colon orifice.  I chose a 33 EEA anvil stapler system. I placed the anvil to the open end of the proximal remaining colon and closed around it using a 0 Prolene pursestring.  We did copious irrigation with crystalloid solution.  Hemostasis was good.  The distal end of the remaining colon easily reached down to the rectal stump.  I scrubbed down and did gentle anal dilation  and advanced the EEA stapler up the rectal stump. The spike was brought out at the provimal end of the rectal stump under direct visualization.  Dr Kieth Brightly attached the anvil of the proximal colon the spike of the stapler. Anvil was tightened down and held clamped for 60 seconds. The EEA stapler was fired and held clamped for 30 seconds. The stapler was released & removed. We noted 2 excellent anastomotic rings. Blue stitch is in the proximal ring.  I did rigid proctoscopy noted the anastomosis was at 15 cm from the anal verge consistent with the proximal rectum.  We did a final irrigation of antibiotic solution (900 mg clindamycin/240 mg gentamicin in a liter of crystalloid) & held that for the pelvic air leak test .  The rectum was insufflated the rectum while clamping the colon proximal to that anastomosis.  There was a negative air leak test. There was no tension of mesentery or bowel at the anastomosis.   Tissues looked viable.  Ureters & bowel uninjured.  The anastomosis looked healthy.  Endoluminal gas was evacuated.  Ports & wound protector removed.  We changed gloves & redraped the patient per colon SSI prevention protocol.  We aspirated the antibiotic irrigation.  Hemostasis was good.  Sterile unused instruments were used from this point.  I closed the 98mm port sites using Monocryl stitch and sterile dressing.  I closed the extraction wound using a 0 Vicryl vertical peritoneal closure and a #1 PDS transverse anterior rectal fascial closure like a small Pfannenstiel closure. I closed the skin with some interrupted Monocryl stitches. I placed antibiotic-soaked wicks into the closure at the corners x2.  I placed sterile dressings.     Patient is being extubated go to recovery room. I had discussed postop care with the patient in detail the office & in the holding area. Instructions are written. I discussed operative findings, updated the patient's status, discussed probable steps to recovery, and gave  postoperative recommendations to the patient's spouse.  Recommendations were made.  Questions were answered.  She expressed understanding & appreciation.   Adin Hector, M.D., F.A.C.S. Gastrointestinal and Minimally Invasive Surgery [facility] [address], [place], [place] [address] [phone] Main / Paging

## 2016-11-14 NOTE — Discharge Instructions (Signed)
SURGERY: POST OP INSTRUCTIONS °(Surgery for small bowel obstruction, colon resection, etc) ° ° °###################################################################### ° °EAT °Gradually transition to a high fiber diet with a fiber supplement over the next few days after discharge ° °WALK °Walk an hour a day.  Control your pain to do that.   ° °CONTROL PAIN °Control pain so that you can walk, sleep, tolerate sneezing/coughing, go up/down stairs. ° °HAVE A BOWEL MOVEMENT DAILY °Keep your bowels regular to avoid problems.  OK to try a laxative to override constipation.  OK to use an antidairrheal to slow down diarrhea.  Call if not better after 2 tries ° °CALL IF YOU HAVE PROBLEMS/CONCERNS °Call if you are still struggling despite following these instructions. °Call if you have concerns not answered by these instructions ° °###################################################################### ° ° °DIET °Follow a light diet the first few days at home.  Start with a bland diet such as soups, liquids, starchy foods, low fat foods, etc.  If you feel full, bloated, or constipated, stay on a ful liquid or pureed/blenderized diet for a few days until you feel better and no longer constipated. °Be sure to drink plenty of fluids every day to avoid getting dehydrated (feeling dizzy, not urinating, etc.). °Gradually add a fiber supplement to your diet over the next week.  Gradually get back to a regular solid diet.  Avoid fast food or heavy meals the first week as you are more likely to get nauseated. °It is expected for your digestive tract to need a few months to get back to normal.  It is common for your bowel movements and stools to be irregular.  You will have occasional bloating and cramping that should eventually fade away.  Until you are eating solid food normally, off all pain medications, and back to regular activities; your bowels will not be normal. °Focus on eating a low-fat, high fiber diet the rest of your life  (See Getting to Good Bowel Health, below). ° °CARE of your INCISION or WOUND °It is good for closed incision and even open wounds to be washed every day.  Shower every day.  Short baths are fine.  Wash the incisions and wounds clean with soap & water.    °If you have a closed incision(s), wash the incision with soap & water every day.  You may leave closed incisions open to air if it is dry.   You may cover the incision with clean gauze & replace it after your daily shower for comfort. °If you have skin tapes (Steristrips) or skin glue (Dermabond) on your incision, leave them in place.  They will fall off on their own like a scab.  You may trim any edges that curl up with clean scissors.  If you have staples, set up an appointment for them to be removed in the office in 10 days after surgery.  °If you have a drain, wash around the skin exit site with soap & water and place a new dressing of gauze or band aid around the skin every day.  Keep the drain site clean & dry.    °If you have an open wound with packing, see wound care instructions.  In general, it is encouraged that you remove your dressing and packing, shower with soap & water, and replace your dressing once a day.  Pack the wound with clean gauze moistened with normal (0.9%) saline to keep the wound moist & uninfected.  Pressure on the dressing for 30 minutes will stop most wound   bleeding.  Eventually your body will heal & pull the open wound closed over the next few months.  °Raw open wounds will occasionally bleed or secrete yellow drainage until it heals closed.  Drain sites will drain a little until the drain is removed.  Even closed incisions can have mild bleeding or drainage the first few days until the skin edges scab over & seal.   °If you have an open wound with a wound vac, see wound vac care instructions. ° ° ° ° °ACTIVITIES as tolerated °Start light daily activities --- self-care, walking, climbing stairs-- beginning the day after surgery.   Gradually increase activities as tolerated.  Control your pain to be active.  Stop when you are tired.  Ideally, walk several times a day, eventually an hour a day.   °Most people are back to most day-to-day activities in a few weeks.  It takes 4-8 weeks to get back to unrestricted, intense activity. °If you can walk 30 minutes without difficulty, it is safe to try more intense activity such as jogging, treadmill, bicycling, low-impact aerobics, swimming, etc. °Save the most intensive and strenuous activity for last (Usually 4-8 weeks after surgery) such as sit-ups, heavy lifting, contact sports, etc.  Refrain from any intense heavy lifting or straining until you are off narcotics for pain control.  You will have off days, but things should improve week-by-week. °DO NOT PUSH THROUGH PAIN.  Let pain be your guide: If it hurts to do something, don't do it.  Pain is your body warning you to avoid that activity for another week until the pain goes down. °You may drive when you are no longer taking narcotic prescription pain medication, you can comfortably wear a seatbelt, and you can safely make sudden turns/stops to protect yourself without hesitating due to pain. °You may have sexual intercourse when it is comfortable. If it hurts to do something, stop. ° °MEDICATIONS °Take your usually prescribed home medications unless otherwise directed.   °Blood thinners:  °Usually you can restart any strong blood thinners after the second postoperative day.  It is OK to take aspirin right away.    ° If you are on strong blood thinners (warfarin/Coumadin, Plavix, Xerelto, Eliquis, Pradaxa, etc), discuss with your surgeon, medicine PCP, and/or cardiologist for instructions on when to restart the blood thinner & if blood monitoring is needed (PT/INR blood check, etc).   ° ° °PAIN CONTROL °Pain after surgery or related to activity is often due to strain/injury to muscle, tendon, nerves and/or incisions.  This pain is usually  short-term and will improve in a few months.  °To help speed the process of healing and to get back to regular activity more quickly, DO THE FOLLOWING THINGS TOGETHER: °1. Increase activity gradually.  DO NOT PUSH THROUGH PAIN °2. Use Ice and/or Heat °3. Try Gentle Massage and/or Stretching °4. Take over the counter pain medication °5. Take Narcotic prescription pain medication for more severe pain ° °Good pain control = faster recovery.  It is better to take more medicine to be more active than to stay in bed all day to avoid medications. °1.  Increase activity gradually °Avoid heavy lifting at first, then increase to lifting as tolerated over the next 6 weeks. °Do not “push through” the pain.  Listen to your body and avoid positions and maneuvers than reproduce the pain.  Wait a few days before trying something more intense °Walking an hour a day is encouraged to help your body recover faster   and more safely.  Start slowly and stop when getting sore.  If you can walk 30 minutes without stopping or pain, you can try more intense activity (running, jogging, aerobics, cycling, swimming, treadmill, sex, sports, weightlifting, etc.) °Remember: If it hurts to do it, then don’t do it! °2. Use Ice and/or Heat °You will have swelling and bruising around the incisions.  This will take several weeks to resolve. °Ice packs or heating pads (6-8 times a day, 30-60 minutes at a time) will help sooth soreness & bruising. °Some people prefer to use ice alone, heat alone, or alternate between ice & heat.  Experiment and see what works best for you.  Consider trying ice for the first few days to help decrease swelling and bruising; then, switch to heat to help relax sore spots and speed recovery. °Shower every day.  Short baths are fine.  It feels good!  Keep the incisions and wounds clean with soap & water.   °3. Try Gentle Massage and/or Stretching °Massage at the area of pain many times a day °Stop if you feel pain - do not  overdo it °4. Take over the counter pain medication °This helps the muscle and nerve tissues become less irritable and calm down faster °Choose ONE of the following over-the-counter anti-inflammatory medications: °Acetaminophen 500mg tabs (Tylenol) 1-2 pills with every meal and just before bedtime (avoid if you have liver problems or if you have acetaminophen in you narcotic prescription) °Naproxen 220mg tabs (ex. Aleve, Naprosyn) 1-2 pills twice a day (avoid if you have kidney, stomach, IBD, or bleeding problems) °Ibuprofen 200mg tabs (ex. Advil, Motrin) 3-4 pills with every meal and just before bedtime (avoid if you have kidney, stomach, IBD, or bleeding problems) °Take with food/snack several times a day as directed for at least 2 weeks to help keep pain / soreness down & more manageable. °5. Take Narcotic prescription pain medication for more severe pain °A prescription for strong pain control is often given to you upon discharge (for example: oxycodone/Percocet, hydrocodone/Norco/Vicodin, or tramadol/Ultram) °Take your pain medication as prescribed. °Be mindful that most narcotic prescriptions contain Tylenol (acetaminophen) as well - avoid taking too much Tylenol. °If you are having problems/concerns with the prescription medicine (does not control pain, nausea, vomiting, rash, itching, etc.), please call us [phone] to see if we need to switch you to a different pain medicine that will work better for you and/or control your side effects better. °If you need a refill on your pain medication, you must call the office before 4 pm and on weekdays only.  By federal law, prescriptions for narcotics cannot be called into a pharmacy.  They must be filled out on paper & picked up from our office by the patient or authorized caretaker.  Prescriptions cannot be filled after 4 pm nor on weekends.   ° °WHEN TO CALL US [phone] °Severe uncontrolled or worsening pain  °Fever over 101 F (38.5 C) °Concerns with  the incision: Worsening pain, redness, rash/hives, swelling, bleeding, or drainage °Reactions / problems with new medications (itching, rash, hives, nausea, etc.) °Nausea and/or vomiting °Difficulty urinating °Difficulty breathing °Worsening fatigue, dizziness, lightheadedness, blurred vision °Other concerns °If you are not getting better after two weeks or are noticing you are getting worse, contact our office [phone] for further advice.  We may need to adjust your medications, re-evaluate you in the office, send you to the emergency room, or see what other things we can do to help. °The   clinic staff is available to answer your questions during regular business hours (8:30am-5pm).  Please don’t hesitate to call and ask to speak to one of our nurses for clinical concerns.    °A surgeon from [facility] is always on call at the hospitals 24 hours/day °If you have a medical emergency, go to the nearest emergency room or call 911. ° °FOLLOW UP in our office °One the day of your discharge from the hospital (or the next business weekday), please call [facility] to set up or confirm an appointment to see your surgeon in the office for a follow-up appointment.  Usually it is 2-3 weeks after your surgery.   °If you have skin staples at your incision(s), let the office know so we can set up a time in the office for the nurse to remove them (usually around 10 days after surgery). °Make sure that you call for appointments the day of discharge (or the next business weekday) from the hospital to ensure a convenient appointment time. °IF YOU HAVE DISABILITY OR FAMILY LEAVE FORMS, BRING THEM TO THE OFFICE FOR PROCESSING.  DO NOT GIVE THEM TO YOUR DOCTOR. ° °[facility] °[address], [address], [place], [place]  [address] ? °[phone] - Main °[phone] - Toll Free,  [phone] - Fax °[email or web address] ° °GETTING TO GOOD BOWEL HEALTH. °It is  expected for your digestive tract to need a few months to get back to normal.  It is common for your bowel movements and stools to be irregular.  You will have occasional bloating and cramping that should eventually fade away.  Until you are eating solid food normally, off all pain medications, and back to regular activities; your bowels will not be normal.   °Avoiding constipation °The goal: ONE SOFT BOWEL MOVEMENT A DAY!    °Drink plenty of fluids.  Choose water first. °TAKE A FIBER SUPPLEMENT EVERY DAY THE REST OF YOUR LIFE °During your first week back home, gradually add back a fiber supplement every day °Experiment which form you can tolerate.   There are many forms such as powders, tablets, wafers, gummies, etc °Psyllium bran (Metamucil), methylcellulose (Citrucel), Miralax or Glycolax, Benefiber, Flax Seed.  °Adjust the dose week-by-week (1/2 dose/day to 6 doses a day) until you are moving your bowels 1-2 times a day.  Cut back the dose or try a different fiber product if it is giving you problems such as diarrhea or bloating. °Sometimes a laxative is needed to help jump-start bowels if constipated until the fiber supplement can help regulate your bowels.  If you are tolerating eating & you are farting, it is okay to try a gentle laxative such as double dose MiraLax, prune juice, or Milk of Magnesia.  Avoid using laxatives too often. °Stool softeners can sometimes help counteract the constipating effects of narcotic pain medicines.  It can also cause diarrhea, so avoid using for too long. °If you are still constipated despite taking fiber daily, eating solids, and a few doses of laxatives, call our office. °Controlling diarrhea °Try drinking liquids and eating bland foods for a few days to avoid stressing your intestines further. °Avoid dairy products (especially milk & ice cream) for a short time.  The intestines often can lose the ability to digest lactose when stressed. °Avoid foods that cause gassiness or  bloating.  Typical foods include beans and other legumes, cabbage, broccoli, and dairy foods.  Avoid greasy, spicy, fast foods.  Every person has   some sensitivity to other foods, so listen to your body and avoid those foods that trigger problems for you. °Probiotics (such as active yogurt, Align, etc) may help repopulate the intestines and colon with normal bacteria and calm down a sensitive digestive tract °Adding a fiber supplement gradually can help thicken stools by absorbing excess fluid and retrain the intestines to act more normally.  Slowly increase the dose over a few weeks.  Too much fiber too soon can backfire and cause cramping & bloating. °It is okay to try and slow down diarrhea with a few doses of antidiarrheal medicines.   °Bismuth subsalicylate (ex. Kayopectate, Pepto Bismol) for a few doses can help control diarrhea.  Avoid if pregnant.   °Loperamide (Imodium) can slow down diarrhea.  Start with one tablet (2mg) first.  Avoid if you are having fevers or severe pain.  °ILEOSTOMY PATIENTS WILL HAVE CHRONIC DIARRHEA since their colon is not in use.    °Drink plenty of liquids.  You will need to drink even more glasses of water/liquid a day to avoid getting dehydrated. °Record output from your ileostomy.  Expect to empty the bag every 3-4 hours at first.  Most people with a permanent ileostomy empty their bag 4-6 times at the least.   °Use antidiarrheal medicine (especially Imodium) several times a day to avoid getting dehydrated.  Start with a dose at bedtime & breakfast.  Adjust up or down as needed.  Increase antidiarrheal medications as directed to avoid emptying the bag more than 8 times a day (every 3 hours). °Work with your wound ostomy nurse to learn care for your ostomy.  See ostomy care instructions. °TROUBLESHOOTING IRREGULAR BOWELS °1) Start with a soft & bland diet. No spicy, greasy, or fried foods.  °2) Avoid gluten/wheat or dairy products from diet to see if symptoms improve. °3) Miralax  17gm or flax seed mixed in 8oz. water or juice-daily. May use 2-4 times a day as needed. °4) Gas-X, Phazyme, etc. as needed for gas & bloating.  °5) Prilosec (omeprazole) over-the-counter as needed °6)  Consider probiotics (Align, Activa, etc) to help calm the bowels down ° °Call your doctor if you are getting worse or not getting better.  Sometimes further testing (cultures, endoscopy, X-ray studies, CT scans, bloodwork, etc.) may be needed to help diagnose and treat the cause of the diarrhea. °[facility] °[address], [address], [place], [place]  [address] °[phone] - Main.    °[phone]  - Toll Free.   [phone] - Fax °[email or web address] ° ° °

## 2016-11-15 LAB — BASIC METABOLIC PANEL
Anion gap: 8 (ref 5–15)
BUN: 18 mg/dL (ref 6–20)
CHLORIDE: 107 mmol/L (ref 101–111)
CO2: 23 mmol/L (ref 22–32)
CREATININE: 0.99 mg/dL (ref 0.61–1.24)
Calcium: 8 mg/dL — ABNORMAL LOW (ref 8.9–10.3)
GFR calc Af Amer: >60 mL/min (ref >60)
GFR calc non Af Amer: >60 mL/min (ref >60)
Glucose, Bld: 107 mg/dL — ABNORMAL HIGH (ref 65–99)
Potassium: 3.9 mmol/L (ref 3.5–5.1)
Sodium: 138 mmol/L (ref 135–145)

## 2016-11-15 LAB — CBC
HCT: 37.6 % — ABNORMAL LOW (ref 39.0–52.0)
Hemoglobin: 13 g/dL (ref 13.0–17.0)
MCH: 28.8 pg (ref 26.0–34.0)
MCHC: 34.6 g/dL (ref 30.0–36.0)
MCV: 83.4 fL (ref 78.0–100.0)
PLATELETS: 82 10*3/uL — AB (ref 150–400)
RBC: 4.51 MIL/uL (ref 4.22–5.81)
RDW: 14.4 % (ref 11.5–15.5)
WBC: 13 10*3/uL — ABNORMAL HIGH (ref 4.0–10.5)

## 2016-11-15 LAB — MAGNESIUM: Magnesium: 1.9 mg/dL (ref 1.7–2.4)

## 2016-11-15 NOTE — Progress Notes (Signed)
[facility]  [address] [place], [place] [address] Phone: [phone] FAX: [place] [phone] 12/17/1950    Problem List:   Principal Problem:   Cancer of descending colon s/p left robotic colectomy 11/14/2016 Active Problems:   Anxiety state   Essential hypertension   Chronic hepatitis C without hepatic coma ([facility] [facility])   1 Day Post-Op  11/14/2016  Procedure(s): XI ROBOT ASSISTED LAPAROSCOPIC LEFT COLON. OF DESCENDING AND SIGMOID COLON. LYSIS OF ADHESIONS. SPLENIC FLEXURE MOBILIZATION. RIGID PROCTOSCOPY   Assessment  Recovering relatively well  Plan:  -Advance Diet  -VTE prophylaxis- SCDs, etc -mobilize as tolerated to help recovery - F/u on pathology. Discussed OR findings -D/C foley POD2 -HTN- benazopril with prn backup  [place] PA-S  11/15/2016  CARE TEAM:  PCP: Maggie Font, MD  Outpatient Care Team: Patient Care Team: Iona Beard, MD as PCP - General (Family Medicine) Michael Boston, MD as Consulting Physician (General Surgery) Manus Gunning, MD as Consulting Physician (Gastroenterology)  Inpatient Treatment Team: Treatment Team: Attending Provider: Michael Boston, MD; Registered Nurse: Mertha Baars, RN; Student Physician's Assistant: Chriss Driver, Student-PA  Subjective:  Walking well Tolerating fulls Pain Controlled  Objective:  Vital signs:  Vitals:   11/14/16 2104 11/15/16 0159 11/15/16 0501 11/15/16 0959  BP: (!) 144/103 129/66 119/78 107/82  Pulse: 97 82 91 88  Resp: '16 14 15 17  '$ Temp: 97.7 F (36.5 C) 98.7 F (37.1 C) 98.9 F (37.2 C) 98.8 F (37.1 C)  TempSrc: Oral Oral Oral Oral  SpO2: 100% 99% 99% 98%  Weight:   107.7 kg (237 lb 7 oz)   Height:        Last BM Date: 11/14/16  Intake/Output   Yesterday:  01/03 0701 - 01/04 0700 In: 4290.8 [I.V.:4240.8; IV Piggyback:50] Out: 2325 [Urine:2300; Blood:25] This  shift:  Total I/O In: 1020 [P.O.:1020] Out: 650 [Urine:650]  Bowel function:  Flatus: YES  BM:  YES  Drain: (No drain)   Physical Exam:  General: Pt awake/alert/oriented x4 in No acute distress Eyes: PERRL, normal EOM.  Sclera clear.  No icterus Neuro: CN II-XII intact w/o focal sensory/motor deficits. Lymph: No head/neck/groin lymphadenopathy Psych:  No delerium/psychosis/paranoia HENT: Normocephalic, Mucus membranes moist.  No thrush Neck: Supple, No tracheal deviation Chest:  No chest wall pain w good excursion CV:  Pulses intact.  Regular rhythm MS: Normal AROM mjr joints.  No obvious deformity Abdomen: Somewhat firm.  Mildy distended.  Nontender.  No evidence of peritonitis.  No incarcerated hernias. Ext:  SCDs BLE.  No mjr edema.  No cyanosis Skin: No petechiae / purpura  Results:   Labs: Results for orders placed or performed during the hospital encounter of 11/14/16 (from the past 48 hour(s))  Basic metabolic panel     Status: Abnormal   Collection Time: 11/15/16  5:05 AM  Result Value Ref Range   Sodium 138 135 - 145 mmol/L   Potassium 3.9 3.5 - 5.1 mmol/L   Chloride 107 101 - 111 mmol/L   CO2 23 22 - 32 mmol/L   Glucose, Bld 107 (H) 65 - 99 mg/dL   BUN 18 6 - 20 mg/dL   Creatinine, Ser 0.99 0.61 - 1.24 mg/dL   Calcium 8.0 (L) 8.9 - 10.3 mg/dL   GFR calc non Af Amer >60 >60 mL/min   GFR calc Af Amer >60 >60 mL/min    Comment: (NOTE) The eGFR has been  calculated using the CKD EPI equation. This calculation has not been validated in all clinical situations. eGFR's persistently <60 mL/min signify possible Chronic Kidney Disease.    Anion gap 8 5 - 15  CBC     Status: Abnormal   Collection Time: 11/15/16  5:05 AM  Result Value Ref Range   WBC 13.0 (H) 4.0 - 10.5 K/uL   RBC 4.51 4.22 - 5.81 MIL/uL   Hemoglobin 13.0 13.0 - 17.0 g/dL   HCT 37.6 (L) 39.0 - 52.0 %   MCV 83.4 78.0 - 100.0 fL   MCH 28.8 26.0 - 34.0 pg   MCHC 34.6 30.0 - 36.0 g/dL   RDW 14.4  11.5 - 15.5 %   Platelets 82 (L) 150 - 400 K/uL    Comment: CONSISTENT WITH PREVIOUS RESULT  Magnesium     Status: None   Collection Time: 11/15/16  5:05 AM  Result Value Ref Range   Magnesium 1.9 1.7 - 2.4 mg/dL    Imaging / Studies: No results found.  Medications / Allergies: per chart  Antibiotics: Anti-infectives    Start     Dose/Rate Route Frequency Ordered Stop   11/15/16 0600  gentamicin (GARAMYCIN) IVPB 100 mg  Status:  Discontinued    Comments:  Pharmacy may adjust dosing strength, schedule, rate of infusion, etc as needed to optimize therapy Send with patient on call to the OR.  Anesthesia to complete antibiotic administration <45mn [facility]   100 mg 200 mL/hr over 30 Minutes Intravenous On call to O.R. 11/14/16 1408 11/14/16 1437   11/15/16 0600  clindamycin (CLEOCIN) IVPB 600 mg  Status:  Discontinued    Comments:  Pharmacy may adjust dosing strength, interval, or rate of medication as needed for optimal therapy for the patient Send with patient on call to the OR.  Anesthesia to complete antibiotic administration <650m prior to [facility]  600 mg 100 mL/hr over 30 Minutes Intravenous On call to O.R. 11/14/16 1408 11/14/16 1437   11/14/16 2000  cefoTEtan (CEFOTAN) 2 g in dextrose 5 % 50 mL IVPB     2 g 100 mL/hr over 30 Minutes Intravenous Every 12 hours 11/14/16 1536 11/14/16 2124   11/14/16 1310  clindamycin (CLEOCIN) 900 mg, gentamicin (GARAMYCIN) 240 mg in sodium chloride 0.9 % 1,000 mL for intraperitoneal lavage  Status:  Discontinued       As needed 11/14/16 1310 11/14/16 1332   11/14/16 0630  clindamycin (CLEOCIN) 900 mg, gentamicin (GARAMYCIN) 240 mg in sodium chloride 0.9 % 1,000 mL for intraperitoneal lavage  Status:  Discontinued      Intraperitoneal To Surgery 11/14/16 0615 11/14/16 1514   11/14/16 0615  cefoTEtan (CEFOTAN) 2 g in dextrose 5 % 50 mL IVPB     2 g 100 mL/hr over 30 Minutes Intravenous On call to  O.R. 11/14/16 [id number]/[id number]      Note: Portions of this report may have been transcribed using voice recognition software. Every effort was made to ensure accuracy; however, inadvertent computerized transcription errors may be present.   Any transcriptional errors that result from this process are unintentional.     KrEdward Qualia1/02/2017

## 2016-11-16 MED ORDER — TRAMADOL HCL 50 MG PO TABS
50.0000 mg | ORAL_TABLET | Freq: Four times a day (QID) | ORAL | Status: DC | PRN
  Administered 2016-11-16: 50 mg via ORAL
  Filled 2016-11-16: qty 1

## 2016-11-16 MED ORDER — LACTATED RINGERS IV BOLUS (SEPSIS): 1000.0000 mL | Freq: Three times a day (TID) | INTRAVENOUS | Status: DC | PRN

## 2016-11-16 MED ORDER — SODIUM CHLORIDE 0.9% FLUSH: 3.0000 mL | INTRAVENOUS | Status: DC | PRN

## 2016-11-16 MED ORDER — SODIUM CHLORIDE 0.9% FLUSH: 3.0000 mL | Freq: Two times a day (BID) | INTRAVENOUS | Status: DC

## 2016-11-16 MED ORDER — SODIUM CHLORIDE 0.9 % IV SOLN: 250.0000 mL | INTRAVENOUS | Status: DC | PRN

## 2016-11-16 MED FILL — traMADol HCL 50 MG TABS: 50 | 4 days supply | Qty: 30 | Fill #0
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
## 2016-11-16 NOTE — Consult Note (Signed)
   [facility] CM Inpatient Consult   11/16/2016  Jermaine Moody [birth year] [id number]     Spoke with [name] on behalf of [place] to [employer] program for [employer] employees/dependents with [employer]. Provided [employer] brochure for HTN management if needed in the future. States he does not have any current Link to Wellness needs. Confirmed best contact number for post discharge call as [phone]. Appreciative of visit.    Marthenia Rolling, MSN-Ed, RN,BSN [facility] Liaison [phone]

## 2016-11-19 ENCOUNTER — Other Ambulatory Visit: Payer: Self-pay | Admitting: *Deleted

## 2016-11-19 ENCOUNTER — Encounter: Payer: Self-pay | Admitting: *Deleted

## 2016-11-19 NOTE — Patient Outreach (Addendum)
[facility]) Care Management  11/19/2016  Jermaine Moody 05-16-1951 [id number]   Subjective: Telephone call to patient's home number, spoke with patient, and HIPAA verified.   Discussed [facility] Care Management [facility] Transition of care follow up, patient voiced understanding, and is in agreement to complete follow up. Patient states he is doing good, exercising, and has adequate pain management.  RNCM educated patient on the importance of hospital follow up with primary MD, voices understanding,  and states he will call primary MD to set up a follow up appointment.   Patient states he is utilizing the [facility] outpatient pharmacy for medications, has family medical leave act (FMLA) paperwork in place, and short tem disability in place.  RNCM educated patient on supplemental policies and patient voiced understanding.  States he does not have any of the supplemental policies (hospital indemnity or critical illness), wished he would have purchased, and may look into them in the future.   Patient states he does not have any transition of care, care coordination, disease management, disease monitoring, transportation, community resource, or pharmacy needs at this time.  States he is very Patent attorney of this RNCM being so pleasant, calling to check on him, and is in agreement to receive [facility] information.  Objective: Per chart review:  Patient hospitalized 11/14/16 - 11/16/16 for Cancer of descending colon.   Status post left robotic colectomy, (DESCENDING AND SIGMOID COLON), LYSIS OF ADHESIONS, MOBILIZATION OF SPLENIC FLEXURE OF COLON, and  RIGID PROCTOSCOPY  on 11/14/16.   Patient also has a history of hypertension and Chronic hepatitis C without hepatic coma.       Assessment: Received [facility] Transition of care referral on 11/16/16.  Transition of care follow up completed, no care management needs, and will proceed with case closure.   Plan: RNCM will send patient successful outreach  letter, [facility] pamphlet, and magnet.  RNCM will send case closure due to follow up completed / no care management needs request to [facility].   Jonea Bukowski H. Annia Friendly, BSN, [place] [facility] Telephonic CM Phone: [phone] Fax: [phone]

## 2016-11-20 ENCOUNTER — Telehealth: Payer: Self-pay | Admitting: *Deleted

## 2016-11-20 ENCOUNTER — Encounter: Payer: Self-pay | Admitting: Oncology

## 2016-11-20 NOTE — Telephone Encounter (Signed)
Oncology Nurse Navigator Documentation  Oncology Nurse Navigator Flowsheets 11/20/2016  Navigator Location [facility]  Referral date to RadOnc/MedOnc 11/20/2016  Navigator Encounter Type Introductory phone call  Abnormal Finding Date 09/10/2016  Confirmed Diagnosis Date 09/10/2016  Surgery Date 11/14/2016  Spoke with patient and provided new patient appointment for 11/29/16 at 1:45/2:00 with Dr. Benay Spice. Informed of location of [place], valet service, and registration process. Reminded to bring photo ID, insurance cards and a current medication list, including supplements. Patient verbalizes understanding. Appointment information mailed to his home. Notified HIM of appointment.

## 2016-11-21 ENCOUNTER — Ambulatory Visit: Payer: Self-pay | Admitting: Surgery

## 2016-11-29 ENCOUNTER — Encounter: Payer: Self-pay | Admitting: *Deleted

## 2016-11-29 ENCOUNTER — Ambulatory Visit (HOSPITAL_BASED_OUTPATIENT_CLINIC_OR_DEPARTMENT_OTHER): Payer: 59 | Admitting: Oncology

## 2016-11-29 ENCOUNTER — Telehealth: Payer: Self-pay | Admitting: Oncology

## 2016-11-29 VITALS — BP 144/100 | HR 98 | Temp 98.6°F | Resp 19 | Ht 71.0 in | Wt 234.6 lb

## 2016-11-29 DIAGNOSIS — C186 Malignant neoplasm of descending colon: Secondary | ICD-10-CM

## 2016-11-29 DIAGNOSIS — I10 Essential (primary) hypertension: Secondary | ICD-10-CM | POA: Diagnosis not present

## 2016-11-29 MED ORDER — PROCHLORPERAZINE MALEATE 10 MG PO TABS
10.0000 mg | ORAL_TABLET | Freq: Four times a day (QID) | ORAL | 0 refills | Status: DC | PRN
Start: 2016-11-29 — End: 2017-07-22

## 2016-11-29 MED ORDER — LIDOCAINE-PRILOCAINE 2.5-2.5 % EX CREA: TOPICAL_CREAM | CUTANEOUS | 0 refills | Status: DC

## 2016-11-29 MED FILL — LIDOCAINE-PRILOCAINE CREAM: 2.5-2.5 | 30 days supply | Qty: 30 | Fill #0

## 2016-11-29 MED FILL — PROCHLORPERAZINE 10 MG TAB: 10 | 7 days supply | Qty: 30 | Fill #0

## 2016-11-29 NOTE — Patient Instructions (Signed)
Care Plan Summary- 11/29/2016 Name: Jermaine Moody         DOB: [birth year] Your Medical Team:  Medical Oncologist:  Dr. Ma Rings Radiation Oncologist:  Surgeon:   Dr. Michael Boston Type of Cancer: Adenocarcinoma of Colon (descending colon)  Stage/Grade: Stage III *Exact staging of your cancer is based on size of the tumor, depth of invasion, involvement of lymph nodes or not, and whether or not the cancer has spread beyond the primary site   Recommendations: Based on information available as of today's consult. Recommendations may change depending on the results of further tests or exams. 1) Chemotherapy with CAPOX (Oxaliplatin IV and Xeloda by mouth) every 3 weeks X 4 2) Repeat colonoscopy in 1 year 3) Inform your children they need colonoscopy by age 62  Next Steps: 1) Chemo class with labs week of 1/22 2)  First chemo will be scheduled for 12/12/16 3)      Questions? Merceda Elks, RN, BSN at [phone]. Manuela Schwartz is your Oncology Nurse Navigator and is available to assist you while you're receiving your medical care at [facility] *Chemotherapy added to surgery can increase cure rate to 80 %
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
## 2016-11-29 NOTE — Telephone Encounter (Signed)
Appointments scheduled per 1/18 LOS. Patient given AVS report and calendars with future scheduled appointments. °

## 2016-11-29 NOTE — Progress Notes (Signed)
Oncology Nurse Navigator Documentation  Oncology Nurse Navigator Flowsheets 11/29/2016  Navigator Location [facility]  Referral date to RadOnc/MedOnc -  Navigator Encounter Type Initial MedOnc  Abnormal Finding Date -  Confirmed Diagnosis Date -  Surgery Date -  Patient Visit Type MedOnc;Initial  Treatment Phase Pre-Tx/Tx Discussion  Barriers/Navigation Needs Education  Education Newly Diagnosed Cancer Education;Preparing for Upcoming Surgery/ Treatment;Understanding Cancer/ Treatment Options  Interventions Education  Education Method Verbal;Written;Teach-back  Support Groups/Services GI Support Group;Admire;Other--[facility] Support Services  Acuity Level 1  Time Spent with Patient 73  Met with patient and wife, Colletta [place] during new patient visit. Explained the role of the GI Nurse Navigator and provided New Patient Packet with information on: 1. Colon cancer--anatomy of colon, CEA marker, PAC 2. Support groups 3. Fall Safety Plan Answered questions, reviewed current treatment plan using TEACH back and provided emotional support. Provided copy of current treatment plan. Plan is to begin CAPOX 12/12/16. Will have port placed on 12/04/16 by Dr. Johney Maine. Mr. Strollo will return to work on 12/10/16 and hopes to work through treatment. 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 He feels back to his baseline now--eating and bowels working well.  Merceda Elks, RN, BSN GI Oncology [place]

## 2016-11-30 ENCOUNTER — Other Ambulatory Visit: Payer: Self-pay | Admitting: *Deleted

## 2016-11-30 DIAGNOSIS — C186 Malignant neoplasm of descending colon: Secondary | ICD-10-CM

## 2016-11-30 MED ORDER — CAPECITABINE 500 MG PO TABS: 2000.0000 mg | ORAL_TABLET | Freq: Two times a day (BID) | ORAL | 0 refills | Status: DC

## 2016-11-30 MED ORDER — CAPECITABINE 500 MG PO TABS: 2000.0000 mg | ORAL_TABLET | Freq: Two times a day (BID) | ORAL | 0 refills | Status: AC

## 2016-12-03 ENCOUNTER — Telehealth: Payer: Self-pay | Admitting: Pharmacist

## 2016-12-03 ENCOUNTER — Encounter (HOSPITAL_COMMUNITY): Payer: Self-pay | Admitting: *Deleted

## 2016-12-03 DIAGNOSIS — M545 Low back pain: Secondary | ICD-10-CM | POA: Diagnosis not present

## 2016-12-03 DIAGNOSIS — I10 Essential (primary) hypertension: Secondary | ICD-10-CM | POA: Diagnosis not present

## 2016-12-03 MED ORDER — BISACODYL 5 MG PO TBEC: 20.0000 mg | DELAYED_RELEASE_TABLET | Freq: Once | ORAL | Status: DC

## 2016-12-03 MED ORDER — METRONIDAZOLE 500 MG PO TABS: 1000.0000 mg | ORAL_TABLET | ORAL | Status: DC

## 2016-12-03 MED ORDER — NEOMYCIN SULFATE 500 MG PO TABS: 1000.0000 mg | ORAL_TABLET | ORAL | Status: DC

## 2016-12-03 MED FILL — CYCLOBENZAPRINE 5 MG TABLET: 5 | 15 days supply | Qty: 15 | Fill #0

## 2016-12-03 NOTE — Telephone Encounter (Signed)
Oral Chemotherapy Pharmacist Encounter  Received new prescription for Xeloda for patient to take in conjunction with infusional oxaliplatin, planned start date 12/12/16. Labs reviewed, OK for treatment Current medication list in Epic assessed, no DDIs with Xeloda identified.  PA submitted on [email or web address] [facility] Status is pending.  Prescription will be sent to [facility] for benefits analysis.  Oral Oncology Clinic will continue to follow.  Johny Drilling, PharmD, BCPS, BCOP 12/03/2016  10:25 AM Oral Oncology Clinic [phone]

## 2016-12-04 ENCOUNTER — Ambulatory Visit (HOSPITAL_COMMUNITY)
Admission: RE | Admit: 2016-12-04 | Discharge: 2016-12-04 | Disposition: A | Discharge status: Home / Self Care | Payer: 59 | Source: Ambulatory Visit | Attending: Surgery | Admitting: Surgery

## 2016-12-04 ENCOUNTER — Ambulatory Visit (HOSPITAL_COMMUNITY): Payer: 59 | Admitting: Anesthesiology

## 2016-12-04 ENCOUNTER — Ambulatory Visit (HOSPITAL_COMMUNITY): Payer: 59

## 2016-12-04 ENCOUNTER — Encounter (HOSPITAL_COMMUNITY)
Admission: RE | Disposition: A | Discharge status: Home / Self Care | Payer: Self-pay | Source: Ambulatory Visit | Attending: Surgery

## 2016-12-04 ENCOUNTER — Encounter (HOSPITAL_COMMUNITY): Payer: Self-pay | Admitting: Certified Registered Nurse Anesthetist

## 2016-12-04 DIAGNOSIS — C186 Malignant neoplasm of descending colon: Secondary | ICD-10-CM | POA: Insufficient documentation

## 2016-12-04 DIAGNOSIS — I10 Essential (primary) hypertension: Secondary | ICD-10-CM | POA: Insufficient documentation

## 2016-12-04 DIAGNOSIS — C189 Malignant neoplasm of colon, unspecified: Secondary | ICD-10-CM | POA: Diagnosis not present

## 2016-12-04 DIAGNOSIS — Z452 Encounter for adjustment and management of vascular access device: Secondary | ICD-10-CM

## 2016-12-04 DIAGNOSIS — Z87891 Personal history of nicotine dependence: Secondary | ICD-10-CM | POA: Diagnosis not present

## 2016-12-04 DIAGNOSIS — Z86010 Personal history of colonic polyps: Secondary | ICD-10-CM | POA: Diagnosis not present

## 2016-12-04 DIAGNOSIS — Z79899 Other long term (current) drug therapy: Secondary | ICD-10-CM | POA: Insufficient documentation

## 2016-12-04 DIAGNOSIS — Z419 Encounter for procedure for purposes other than remedying health state, unspecified: Secondary | ICD-10-CM

## 2016-12-04 DIAGNOSIS — F419 Anxiety disorder, unspecified: Secondary | ICD-10-CM | POA: Diagnosis not present

## 2016-12-04 DIAGNOSIS — Z95828 Presence of other vascular implants and grafts: Secondary | ICD-10-CM

## 2016-12-04 HISTORY — DX: Dyspnea, unspecified: R06.00

## 2016-12-04 HISTORY — PX: PORTACATH PLACEMENT: SHX2246

## 2016-12-04 LAB — BASIC METABOLIC PANEL
ANION GAP: 8 (ref 5–15)
BUN: 23 mg/dL — ABNORMAL HIGH (ref 6–20)
CALCIUM: 9 mg/dL (ref 8.9–10.3)
CO2: 24 mmol/L (ref 22–32)
CREATININE: 1.19 mg/dL (ref 0.61–1.24)
Chloride: 104 mmol/L (ref 101–111)
GFR calc Af Amer: >60 mL/min (ref >60)
GFR calc non Af Amer: >60 mL/min (ref >60)
GLUCOSE: 87 mg/dL (ref 65–99)
Potassium: 4.2 mmol/L (ref 3.5–5.1)
Sodium: 136 mmol/L (ref 135–145)

## 2016-12-04 LAB — CBC
HCT: 41.7 % (ref 39.0–52.0)
HEMOGLOBIN: 14.1 g/dL (ref 13.0–17.0)
MCH: 28.5 pg (ref 26.0–34.0)
MCHC: 33.8 g/dL (ref 30.0–36.0)
MCV: 84.4 fL (ref 78.0–100.0)
Platelets: 111 10*3/uL — ABNORMAL LOW (ref 150–400)
RBC: 4.94 MIL/uL (ref 4.22–5.81)
RDW: 14.5 % (ref 11.5–15.5)
WBC: 8.2 10*3/uL (ref 4.0–10.5)

## 2016-12-04 SURGERY — INSERTION, TUNNELED CENTRAL VENOUS DEVICE, WITH PORT
Anesthesia: General | Site: Chest | Laterality: Right

## 2016-12-04 MED ORDER — PROPOFOL 10 MG/ML IV BOLUS
INTRAVENOUS | Status: DC | PRN
  Administered 2016-12-04: 170 mg via INTRAVENOUS

## 2016-12-04 MED ORDER — MIDAZOLAM HCL 2 MG/2ML IJ SOLN
INTRAMUSCULAR | Status: AC
  Filled 2016-12-04: qty 2

## 2016-12-04 MED ORDER — ONDANSETRON HCL 4 MG/2ML IJ SOLN
INTRAMUSCULAR | Status: AC
  Filled 2016-12-04: qty 2

## 2016-12-04 MED ORDER — PHENYLEPHRINE HCL 10 MG/ML IJ SOLN
INTRAMUSCULAR | Status: DC | PRN
  Administered 2016-12-04 (×3): 80 ug via INTRAVENOUS
  Administered 2016-12-04: 40 ug via INTRAVENOUS
  Administered 2016-12-04: 120 ug via INTRAVENOUS

## 2016-12-04 MED ORDER — SODIUM CHLORIDE 0.9 % IV SOLN
INTRAVENOUS | Status: DC | PRN
  Administered 2016-12-04: 07:00:00

## 2016-12-04 MED ORDER — DEXAMETHASONE SODIUM PHOSPHATE 10 MG/ML IJ SOLN
INTRAMUSCULAR | Status: AC
  Filled 2016-12-04: qty 1

## 2016-12-04 MED ORDER — LACTATED RINGERS IV SOLN
INTRAVENOUS | Status: DC | PRN
  Administered 2016-12-04: 07:00:00 via INTRAVENOUS

## 2016-12-04 MED ORDER — OXYCODONE HCL 5 MG/5ML PO SOLN: 5.0000 mg | Freq: Once | ORAL | Status: DC | PRN

## 2016-12-04 MED ORDER — OXYCODONE HCL 5 MG PO TABS: 5.0000 mg | ORAL_TABLET | Freq: Once | ORAL | Status: DC | PRN

## 2016-12-04 MED ORDER — FENTANYL CITRATE (PF) 100 MCG/2ML IJ SOLN
INTRAMUSCULAR | Status: DC | PRN
  Administered 2016-12-04: 25 ug via INTRAVENOUS
  Administered 2016-12-04: 50 ug via INTRAVENOUS
  Administered 2016-12-04: 25 ug via INTRAVENOUS

## 2016-12-04 MED ORDER — FENTANYL CITRATE (PF) 100 MCG/2ML IJ SOLN: 25.0000 ug | INTRAMUSCULAR | Status: DC | PRN

## 2016-12-04 MED ORDER — FENTANYL CITRATE (PF) 100 MCG/2ML IJ SOLN
INTRAMUSCULAR | Status: AC
  Filled 2016-12-04: qty 2

## 2016-12-04 MED ORDER — BUPIVACAINE HCL (PF) 0.25 % IJ SOLN
INTRAMUSCULAR | Status: AC
  Filled 2016-12-04: qty 30

## 2016-12-04 MED ORDER — CHLORHEXIDINE GLUCONATE CLOTH 2 % EX PADS: 6.0000 | MEDICATED_PAD | Freq: Once | CUTANEOUS | Status: DC

## 2016-12-04 MED ORDER — LIDOCAINE 2% (20 MG/ML) 5 ML SYRINGE
INTRAMUSCULAR | Status: DC | PRN
  Administered 2016-12-04: 50 mg via INTRAVENOUS

## 2016-12-04 MED ORDER — HEPARIN SOD (PORK) LOCK FLUSH 100 UNIT/ML IV SOLN
INTRAVENOUS | Status: DC | PRN
  Administered 2016-12-04: 500 [IU] via INTRAVENOUS

## 2016-12-04 MED ORDER — LIDOCAINE HCL (PF) 1 % IJ SOLN
INTRAMUSCULAR | Status: AC
  Filled 2016-12-04: qty 30

## 2016-12-04 MED ORDER — PHENYLEPHRINE 40 MCG/ML (10ML) SYRINGE FOR IV PUSH (FOR BLOOD PRESSURE SUPPORT)
PREFILLED_SYRINGE | INTRAVENOUS | Status: AC
  Filled 2016-12-04: qty 10

## 2016-12-04 MED ORDER — DEXAMETHASONE SODIUM PHOSPHATE 10 MG/ML IJ SOLN
INTRAMUSCULAR | Status: DC | PRN
  Administered 2016-12-04: 10 mg via INTRAVENOUS

## 2016-12-04 MED ORDER — PROPOFOL 10 MG/ML IV BOLUS
INTRAVENOUS | Status: AC
  Filled 2016-12-04: qty 40

## 2016-12-04 MED ORDER — ONDANSETRON HCL 4 MG/2ML IJ SOLN: 4.0000 mg | Freq: Four times a day (QID) | INTRAMUSCULAR | Status: DC | PRN

## 2016-12-04 MED ORDER — BUPIVACAINE HCL (PF) 0.25 % IJ SOLN
INTRAMUSCULAR | Status: DC | PRN
  Administered 2016-12-04: 15 mL

## 2016-12-04 MED ORDER — CEFAZOLIN SODIUM-DEXTROSE 2-4 GM/100ML-% IV SOLN
2.0000 g | INTRAVENOUS | Status: AC
  Administered 2016-12-04: 2 g via INTRAVENOUS
  Filled 2016-12-04: qty 100

## 2016-12-04 MED ORDER — EPHEDRINE SULFATE 50 MG/ML IJ SOLN
INTRAMUSCULAR | Status: DC | PRN
  Administered 2016-12-04: 5 mg via INTRAVENOUS

## 2016-12-04 MED ORDER — ONDANSETRON HCL 4 MG/2ML IJ SOLN
INTRAMUSCULAR | Status: DC | PRN
  Administered 2016-12-04: 4 mg via INTRAVENOUS

## 2016-12-04 MED ORDER — LIDOCAINE 2% (20 MG/ML) 5 ML SYRINGE
INTRAMUSCULAR | Status: AC
  Filled 2016-12-04: qty 5

## 2016-12-04 MED ORDER — HEPARIN SOD (PORK) LOCK FLUSH 100 UNIT/ML IV SOLN
INTRAVENOUS | Status: AC
  Filled 2016-12-04: qty 5

## 2016-12-04 MED ORDER — LIDOCAINE HCL (PF) 1 % IJ SOLN
INTRAMUSCULAR | Status: DC | PRN
  Administered 2016-12-04: 15 mL

## 2016-12-04 SURGICAL SUPPLY — 53 items
BAG DECANTER FOR FLEXI CONT (MISCELLANEOUS) ×3 IMPLANT
BLADE SURG 11 STRL SS (BLADE) ×3 IMPLANT
BLADE SURG 15 STRL LF DISP TIS (BLADE) ×1 IMPLANT
BLADE SURG 15 STRL SS (BLADE) ×2
CANISTER SUCTION 2500CC (MISCELLANEOUS) IMPLANT
CHLORAPREP W/TINT 10.5 ML (MISCELLANEOUS) ×3 IMPLANT
COVER SURGICAL LIGHT HANDLE (MISCELLANEOUS) ×3 IMPLANT
COVER TRANSDUCER ULTRASND GEL (DRAPE) ×3 IMPLANT
CRADLE DONUT ADULT HEAD (MISCELLANEOUS) ×3 IMPLANT
DECANTER SPIKE VIAL GLASS SM (MISCELLANEOUS) IMPLANT
DRAPE C-ARM 42X72 X-RAY (DRAPES) ×3 IMPLANT
DRAPE LAPAROTOMY TRNSV 102X78 (DRAPE) ×3 IMPLANT
DRSG TEGADERM 2-3/8X2-3/4 SM (GAUZE/BANDAGES/DRESSINGS) ×6 IMPLANT
DRSG TEGADERM 4X4.75 (GAUZE/BANDAGES/DRESSINGS) ×6 IMPLANT
ELECT CAUTERY BLADE 6.4 (BLADE) ×3 IMPLANT
ELECT REM PT RETURN 9FT ADLT (ELECTROSURGICAL) ×3
ELECTRODE REM PT RTRN 9FT ADLT (ELECTROSURGICAL) ×1 IMPLANT
GAUZE SPONGE 2X2 8PLY STRL LF (GAUZE/BANDAGES/DRESSINGS) ×1 IMPLANT
GAUZE SPONGE 4X4 16PLY XRAY LF (GAUZE/BANDAGES/DRESSINGS) ×3 IMPLANT
GLOVE BIOGEL PI IND STRL 8 (GLOVE) ×1 IMPLANT
GLOVE BIOGEL PI INDICATOR 8 (GLOVE) ×2
GLOVE ECLIPSE 8.0 STRL XLNG CF (GLOVE) ×3 IMPLANT
GOWN STRL REUS W/ TWL LRG LVL3 (GOWN DISPOSABLE) ×1 IMPLANT
GOWN STRL REUS W/ TWL XL LVL3 (GOWN DISPOSABLE) ×1 IMPLANT
GOWN STRL REUS W/TWL LRG LVL3 (GOWN DISPOSABLE) ×2
GOWN STRL REUS W/TWL XL LVL3 (GOWN DISPOSABLE) ×2
INTRODUCER 13FR (MISCELLANEOUS) IMPLANT
INTRODUCER COOK 11FR (CATHETERS) IMPLANT
KIT BASIN OR (CUSTOM PROCEDURE TRAY) ×3 IMPLANT
KIT PORT POWER 8FR ISP CVUE (Catheter) ×3 IMPLANT
KIT ROOM TURNOVER OR (KITS) ×3 IMPLANT
NEEDLE 22X1 1/2 (OR ONLY) (NEEDLE) ×3 IMPLANT
NS IRRIG 1000ML POUR BTL (IV SOLUTION) ×3 IMPLANT
PACK SURGICAL SETUP 50X90 (CUSTOM PROCEDURE TRAY) ×3 IMPLANT
PAD ARMBOARD 7.5X6 YLW CONV (MISCELLANEOUS) ×6 IMPLANT
PENCIL BUTTON HOLSTER BLD 10FT (ELECTRODE) ×3 IMPLANT
SET INTRODUCER 12FR PACEMAKER (SHEATH) IMPLANT
SET SHEATH INTRODUCER 10FR (MISCELLANEOUS) IMPLANT
SHEATH COOK PEEL AWAY SET 9F (SHEATH) IMPLANT
SPONGE GAUZE 2X2 STER 10/PKG (GAUZE/BANDAGES/DRESSINGS) ×2
SUT MNCRL AB 4-0 PS2 18 (SUTURE) ×3 IMPLANT
SUT MON AB 4-0 PC3 18 (SUTURE) ×3 IMPLANT
SUT PROLENE 2 0 CT2 30 (SUTURE) ×6 IMPLANT
SUT VIC AB 3-0 SH 27 (SUTURE) ×2
SUT VIC AB 3-0 SH 27X BRD (SUTURE) ×1 IMPLANT
SYR 20ML ECCENTRIC (SYRINGE) ×6 IMPLANT
SYR 5ML LUER SLIP (SYRINGE) ×3 IMPLANT
SYR CONTROL 10ML LL (SYRINGE) ×3 IMPLANT
TOWEL OR 17X24 6PK STRL BLUE (TOWEL DISPOSABLE) ×3 IMPLANT
TOWEL OR 17X26 10 PK STRL BLUE (TOWEL DISPOSABLE) ×3 IMPLANT
TUBE CONNECTING 12'X1/4 (SUCTIONS)
TUBE CONNECTING 12X1/4 (SUCTIONS) IMPLANT
YANKAUER SUCT BULB TIP NO VENT (SUCTIONS) IMPLANT

## 2016-12-04 NOTE — Anesthesia Preprocedure Evaluation (Signed)
Anesthesia Evaluation  Patient identified by MRN, date of birth, ID band Patient awake    Reviewed: Allergy & Precautions, H&P , NPO status , Patient's Chart, lab work & pertinent test results  Airway Mallampati: II   Neck ROM: full    Dental   Pulmonary former smoker,    breath sounds clear to auscultation       Cardiovascular hypertension,  Rhythm:regular Rate:Normal     Neuro/Psych PSYCHIATRIC DISORDERS Anxiety    GI/Hepatic (+) Hepatitis -, CColon CA   Endo/Other    Renal/GU      Musculoskeletal   Abdominal   Peds  Hematology   Anesthesia Other Findings   Reproductive/Obstetrics                             Anesthesia Physical Anesthesia Plan  ASA: III  Anesthesia Plan: General   Post-op Pain Management:    Induction: Intravenous  Airway Management Planned: LMA  Additional Equipment:   Intra-op Plan:   Post-operative Plan:   Informed Consent: I have reviewed the patients History and Physical, chart, labs and discussed the procedure including the risks, benefits and alternatives for the proposed anesthesia with the patient or authorized representative who has indicated his/her understanding and acceptance.     Plan Discussed with: CRNA, Anesthesiologist and Surgeon  Anesthesia Plan Comments:         Anesthesia Quick Evaluation

## 2016-12-04 NOTE — Discharge Instructions (Signed)
PORT-A-CATHETER PLACEMENT: ° °INSTRUCTIONS AFTER SURGERY ° °DIET: Follow a light bland diet the first night after arrival home, such as soup, liquids, crackers, etc.  Be sure to include lots of fluids daily.  Avoid fast food or heavy meals as your are more likely to get nauseated.  °  °Take your usually prescribed home medications unless otherwise directed. ° °PAIN CONTROL: °Pain is best controlled by a usual combination of three different methods TOGETHER: °Ice/Heat °Over the counter acetaminophen pain medication °Prescription pain medication ° °Most patients will experience some swelling and bruising around the incisions.  Ice packs or heating pads (30-60 minutes up to 6 times a day) will help. Use ice for the first few days to help decrease swelling and bruising, then switch to heat to help relax tight/sore spots and speed recovery.  Some people prefer to use ice alone, heat alone, alternating between ice & heat.  Experiment to what works for you.  Swelling and bruising can take several weeks to resolve.   ° °It is helpful to take an over-the-counter pain medication regularly for the first few weeks.  Using acetaminophen (Tylenol, etc) 500-650mg four times a day (every meal & bedtime) is usually safest since NSAIDs like ibuprofen are not advisable in patients with kidney disease. ° °A  prescription for pain medication (such as oxycodone, hydrocodone, etc) may be given to you upon discharge.  Take your pain medication as prescribed.  ° °If you are having problems/concerns with the prescription medicine (does not control pain, nausea, vomiting, rash, itching, etc), please call us [phone] to see if we need to switch you to a different pain medicine that will work better for you and/or control your side effect better. °If you need a refill on your pain medication, please contact your pharmacy.  They will contact our office to request authorization. Prescriptions will not be filled after 5 pm or on  week-ends. ° °Avoid getting constipated.   °Between the surgery and the pain medications, it is common to experience some constipation.  Increasing fluid intake and taking a fiber supplement (such as Metamucil, Citrucel, FiberCon, MiraLax, etc) 1-2 times a day regularly will usually help prevent this problem from occurring.  A mild laxative (prune juice, Milk of Magnesia, MiraLax, etc) should be taken according to package directions if there are no bowel movements after 48 hours.   ° °Wash / shower every day.   °You may shower over the dressings as they are waterproof.  Continue to shower over incision(s) after the dressing is off. ° °The Chemotherapy / Oncology nurse will remove your waterproof bandages in their office a few days after surgery.  Do not remove the bandages unless you are 5 days out from surgery ° °ACTIVITIES as tolerated:   °You may resume regular (light) daily activities beginning the next day--such as daily self-care, walking, climbing stairs--gradually increasing activities as tolerated.  If you can walk 30 minutes without difficulty, it is safe to try more intense activity such as jogging, treadmill, bicycling, low-impact aerobics, swimming, etc. °Save the most intensive and strenuous activity for last such as push-ups, heavy lifting, contact sports, etc  Refrain from any heavy lifting or straining until you are off narcotics for pain control.   ° °DO NOT PUSH THROUGH PAIN.  Let pain be your guide: If it hurts to do something, don't do it.  Pain is your body warning you to avoid that activity for another week until the pain goes down. °You may drive   when you are no longer taking prescription pain medication, you can comfortably wear a seatbelt, and you can safely maneuver your car and apply brakes. °You may have sexual intercourse when it is comfortable.  ° °FOLLOW UP with the Oncology / Chemotherapy nurses closely after surgery. °Please call [facility] at [phone] only as needed. ° °The infusion  nurses & Oncology usually follow you closely, making the need for follow-up in our office redundant and therefore not needed.  If they or you have concerns, please call us for possible follow-up in our office °9. IF YOU HAVE DISABILITY OR FAMILY LEAVE FORMS, BRING THEM TO THE OFFICE FOR PROCESSING.  DO NOT GIVE THEM TO YOUR DOCTOR. ° ° °WHEN TO CALL US [phone]: °Poor pain control °Reactions / problems with new medications (rash/itching, nausea, etc)  °Fever over 101.5 F (38.5 C) °Worsening swelling or bruising °Continued bleeding from incision. °Increased pain, redness, or drainage from the incision ° ° The clinic staff is available to answer your questions during regular business hours (8:30am-5pm).  Please don’t hesitate to call and ask to speak to one of our nurses for clinical concerns.  ° If you have a medical emergency, go to the nearest emergency room or call 911. ° A surgeon from [facility] is always on call at the hospitals ° ° °[facility] °[address], [address], [place], [place]  [address] ? °MAIN: [phone] ? TOLL FREE: [phone] ?  °FAX [phone] °[email or web address] ° °

## 2016-12-04 NOTE — Interval H&P Note (Signed)
History and Physical Interval Note:  12/04/2016 7:12 AM  Jermaine Moody  has presented today for surgery, with the diagnosis of COLON CANCER  The various methods of treatment have been discussed with the patient and family. After consideration of risks, benefits and other options for treatment, the patient has consented to  Procedure(s): INSERTION PORT-A-CATH UNDER ULTRASOUND AND FLUOROSCOPIC GUIDANCE (N/A) as a surgical intervention .  The patient's history has been reviewed, patient examined, no change in status, stable for surgery.  I have reviewed the patient's chart and labs.  Questions were answered to the patient's satisfaction.     Fintan Grater C.

## 2016-12-04 NOTE — Anesthesia Postprocedure Evaluation (Signed)
Anesthesia Post Note  Patient: Jermaine Moody  Procedure(s) Performed: Procedure(s) (LRB): INSERTION PORT-A-CATH RIGHT CHEST UNDER ULTRASOUND AND FLUOROSCOPIC GUIDANCE (Right)  Patient location during evaluation: PACU Anesthesia Type: General Level of consciousness: awake and alert and patient cooperative Pain management: pain level controlled Vital Signs Assessment: post-procedure vital signs reviewed and stable Respiratory status: spontaneous breathing and respiratory function stable Cardiovascular status: stable Anesthetic complications: no       Last Vitals:  Vitals:   12/04/16 0930 12/04/16 0941  BP:    Pulse: 79 78  Resp: 16 16  Temp:      Last Pain:  Vitals:   12/04/16 0915  TempSrc:   PainSc: 0-No pain                 Jagdeep Ancheta S

## 2016-12-04 NOTE — Op Note (Signed)
12/04/2016  8:19 AM  PATIENT:  Jermaine Moody  66 y.o. male  Patient Care Team: Iona Beard, MD as PCP - General (Family Medicine) Michael Boston, MD as Consulting Physician (General Surgery) Manus Gunning, MD as Consulting Physician (Gastroenterology) Ladell Pier, MD as Consulting Physician (Oncology)  PRE-OPERATIVE DIAGNOSIS:  COLON CANCER  POST-OPERATIVE DIAGNOSIS:  COLON CANCER  PROCEDURE:  Procedure(s): INSERTION PORT-A-CATH RIGHT CHEST UNDER ULTRASOUND AND FLUOROSCOPIC GUIDANCE  SURGEON:  Adin Hector, MD  ASSISTANT: none   ANESTHESIA:   local and general  EBL:  Total I/O In: 750 [I.V.:750] Out: 25 [Blood:25]  Delay start of Pharmacological VTE agent (>24hrs) due to surgical blood loss or risk of bleeding:  no  DRAINS: none   SPECIMEN:  No Specimen  DISPOSITION OF SPECIMEN:  N/A  COUNTS:  YES  PLAN OF CARE: Discharge to home after PACU  PATIENT DISPOSITION:  PACU - hemodynamically stable.  INDICATION:  Patient with need for IV therapy.  Chemotherapy for colon cancer.    Port-A-Cath placement was requested.  Use of a central venous catheter for intravenous therapy was discussed.  Technique of catheter placement using ultrasound and fluoroscopy guidance was discussed.  Risks such as bleeding, infection, pneumothorax, catheter occlusion, reoperation, and other risks were discussed.   I noted a good likelihood this will help address the problem.  Questions were answered.  The patient expressed understanding & wishes to proceed.  OR FINDINGS:   Normal-appearing anatomy. Is an 8 [place] Clear Vue power port. It goes through the right internal jugular vein  DESCRIPTION:  Informed consent was confirmed. Patient was brought the operating room. and positioned supine. Arms were tucked. The patient underwent deep sedation. Neck and chest were clipped and prepped and draped in a sterile fashion. A surgical timeout confirmed our plan.  I placed a field block  of local anesthesia on the neck & chest  I used the ultrasound to identify the right internal jugular vein. I entered into it using on the first venipuncture under direct ultrasound guidance. Wire was passed into the inferior vena cava by fluoroscopy.  I made an incision in the lateral infraclavicular pocket. Made a subcutaneous pocket. I tunneled the power port from the chest wound to the neck puncture site. The port was secured to the left anterior chest wall using 2-0 Prolene interrupted stitches x4. Catheter flushed well.  I used a dilator on the wire using Seldinger technique to dilate the neck tract. Then I used a dilator with a peel away sheath.  We used fluoroscopy.  I pulled the wire back into the right atrial/superior vena cava junction.  I pulled the wire back until it was at the neck puncture site. This gave the true measurement of the intravenous segment. I cut the catheter to appropriate length. I removed the wire and dilator. The catheter was placed within the sheath. The sheath was peeled away.  Fluoroscopy confirmed the tip in the RA/RV junction.  I pulled it back into the distal SVC/RA junction.     Catheter aspirated and flushed well. On final fluoro reevaluation the tip seen to be in good position in the distal SVC.    I closed the wounds using 4 Monocryl stitch & placed sterile dressings. Patient should go home later today. Catheter is okay to use.   Adin Hector, M.D., F.A.C.S. Gastrointestinal and Minimally Invasive Surgery [facility] [address] [place], [place] [address] [phone] Main / Paging

## 2016-12-04 NOTE — Transfer of Care (Signed)
Immediate Anesthesia Transfer of Care Note  Patient: Jermaine Moody  Procedure(s) Performed: Procedure(s): INSERTION PORT-A-CATH RIGHT CHEST UNDER ULTRASOUND AND FLUOROSCOPIC GUIDANCE (Right)  Patient Location: PACU  Anesthesia Type:General  Level of Consciousness: awake, alert  and oriented  Airway & Oxygen Therapy: Patient Spontanous Breathing and Patient connected to nasal cannula oxygen  Post-op Assessment: Report given to RN and Post -op Vital signs reviewed and stable  Post vital signs: Reviewed and stable  Last Vitals:  Vitals:   12/04/16 0657 12/04/16 0822  BP: (!) 144/98 126/86  Pulse:  85  Resp:  11  Temp:  36.6 C    Last Pain:  Vitals:   12/04/16 0822  TempSrc:   PainSc: 0-No pain         Complications: No apparent anesthesia complications
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
## 2016-12-05 ENCOUNTER — Other Ambulatory Visit: Payer: Self-pay | Admitting: Oncology

## 2016-12-05 ENCOUNTER — Encounter: Payer: Self-pay | Admitting: *Deleted

## 2016-12-05 ENCOUNTER — Other Ambulatory Visit: Payer: 59

## 2016-12-05 ENCOUNTER — Encounter (HOSPITAL_COMMUNITY): Payer: Self-pay | Admitting: Surgery

## 2016-12-05 MED FILL — XELODA 500 MG TABLET: 500 | 21 days supply | Qty: 112 | Fill #0

## 2016-12-05 NOTE — Progress Notes (Signed)
START ON PATHWAY REGIMEN - Colorectal  COS82: CapeOx q21 Days x 4 Cycles   A cycle is every 21 days:     Capecitabine (Xeloda(R)) 850 mg/m2 orally twice a day days 1 through 14 followed by 7 days off Dose Mod: None     Oxaliplatin (Eloxatin(R)) 130 mg/m2 in 250 mL D5W IV over 2 hours day 1 every 21 days Dose Mod: None  **Always confirm dose/schedule in your pharmacy ordering system**    Patient Characteristics: Colon Adjuvant, Stage III, Low Risk (T1-3, N1) Current evidence of distant metastases? No AJCC T Category: TX AJCC N Category: NX AJCC M Category: Staged < 8th Ed. AJCC 8 Stage Grouping: IIIB  Intent of Therapy: Curative Intent, Discussed with Patient

## 2016-12-06 NOTE — Telephone Encounter (Signed)
PA approved by [employer] effective for MAX of 12 fills from 12/05/16 - 12/04/17.  The request is approved for up to 112 tabs per 21 days.  It was approved for capecitabine.  NOTE: A 2nd PA has been entered for 12 fills of capecitabine 150 mg tabs for 56 tabs per 21 days to allow a dose increase if needed; effective 12/05/16 - 12/04/17.  Reference auth #[id number].  We do not have active Rx for the 150 mg tablets so if needed, we'll have to get the Rx if a dose increase is done.  Planned tx start 12/12/16.  Kennith Center, Pharm.D., CPP 12/06/2016@11 :23 AM Oral Chemo [facility]

## 2016-12-09 ENCOUNTER — Other Ambulatory Visit: Payer: Self-pay | Admitting: Oncology

## 2016-12-11 ENCOUNTER — Encounter: Payer: Self-pay | Admitting: Pharmacist

## 2016-12-12 ENCOUNTER — Other Ambulatory Visit: Payer: Self-pay | Admitting: *Deleted

## 2016-12-12 ENCOUNTER — Other Ambulatory Visit (HOSPITAL_BASED_OUTPATIENT_CLINIC_OR_DEPARTMENT_OTHER): Payer: 59

## 2016-12-12 ENCOUNTER — Ambulatory Visit (HOSPITAL_BASED_OUTPATIENT_CLINIC_OR_DEPARTMENT_OTHER): Payer: 59

## 2016-12-12 ENCOUNTER — Other Ambulatory Visit: Payer: Self-pay | Admitting: Oncology

## 2016-12-12 ENCOUNTER — Encounter: Payer: Self-pay | Admitting: *Deleted

## 2016-12-12 ENCOUNTER — Ambulatory Visit: Payer: 59

## 2016-12-12 DIAGNOSIS — C186 Malignant neoplasm of descending colon: Secondary | ICD-10-CM

## 2016-12-12 DIAGNOSIS — Z95828 Presence of other vascular implants and grafts: Secondary | ICD-10-CM

## 2016-12-12 DIAGNOSIS — Z5111 Encounter for antineoplastic chemotherapy: Secondary | ICD-10-CM

## 2016-12-12 LAB — CBC WITH DIFFERENTIAL/PLATELET
BASO%: 1.1 % (ref 0.0–2.0)
BASOS ABS: 0.1 10*3/uL (ref 0.0–0.1)
EOS%: 1.8 % (ref 0.0–7.0)
Eosinophils Absolute: 0.1 10*3/uL (ref 0.0–0.5)
HCT: 42 % (ref 38.4–49.9)
HEMOGLOBIN: 14 g/dL (ref 13.0–17.1)
LYMPH%: 25.9 % (ref 14.0–49.0)
MCH: 28.7 pg (ref 27.2–33.4)
MCHC: 33.4 g/dL (ref 32.0–36.0)
MCV: 86.1 fL (ref 79.3–98.0)
MONO#: 0.8 10*3/uL (ref 0.1–0.9)
MONO%: 10.9 % (ref 0.0–14.0)
NEUT#: 4.5 10*3/uL (ref 1.5–6.5)
NEUT%: 60.3 % (ref 39.0–75.0)
Platelets: 86 10*3/uL — ABNORMAL LOW (ref 140–400)
RBC: 4.88 10*6/uL (ref 4.20–5.82)
RDW: 14.1 % (ref 11.0–14.6)
WBC: 7.4 10*3/uL (ref 4.0–10.3)
lymph#: 1.9 10*3/uL (ref 0.9–3.3)

## 2016-12-12 LAB — COMPREHENSIVE METABOLIC PANEL
ALBUMIN: 3.7 g/dL (ref 3.5–5.0)
ALK PHOS: 62 U/L (ref 40–150)
ALT: 9 U/L (ref 0–55)
AST: 12 U/L (ref 5–34)
Anion Gap: 8 mEq/L (ref 3–11)
BUN: 16.6 mg/dL (ref 7.0–26.0)
CHLORIDE: 105 meq/L (ref 98–109)
CO2: 26 mEq/L (ref 22–29)
Calcium: 9.4 mg/dL (ref 8.4–10.4)
Creatinine: 0.9 mg/dL (ref 0.7–1.3)
EGFR: >90 mL/min/{1.73_m2} (ref >90)
Glucose: 118 mg/dl (ref 70–140)
POTASSIUM: 3.7 meq/L (ref 3.5–5.1)
SODIUM: 140 meq/L (ref 136–145)
Total Bilirubin: 0.5 mg/dL (ref 0.20–1.20)
Total Protein: 7.8 g/dL (ref 6.4–8.3)

## 2016-12-12 MED ORDER — PALONOSETRON HCL INJECTION 0.25 MG/5ML
0.2500 mg | Freq: Once | INTRAVENOUS | Status: AC
Start: 2016-12-12 — End: 2016-12-12
  Administered 2016-12-12: 0.25 mg via INTRAVENOUS

## 2016-12-12 MED ORDER — HEPARIN SOD (PORK) LOCK FLUSH 100 UNIT/ML IV SOLN
500.0000 [IU] | Freq: Once | INTRAVENOUS | Status: AC | PRN
  Administered 2016-12-12: 500 [IU]
  Filled 2016-12-12: qty 5

## 2016-12-12 MED ORDER — DEXAMETHASONE SODIUM PHOSPHATE 10 MG/ML IJ SOLN
10.0000 mg | Freq: Once | INTRAMUSCULAR | Status: AC
  Administered 2016-12-12: 10 mg via INTRAVENOUS

## 2016-12-12 MED ORDER — SODIUM CHLORIDE 0.9% FLUSH
10.0000 mL | INTRAVENOUS | Status: DC | PRN
  Administered 2016-12-12: 10 mL via INTRAVENOUS
  Filled 2016-12-12: qty 10

## 2016-12-12 MED ORDER — DEXTROSE 5 % IV SOLN
Freq: Once | INTRAVENOUS | Status: AC
  Administered 2016-12-12: 13:00:00 via INTRAVENOUS

## 2016-12-12 MED ORDER — SODIUM CHLORIDE 0.9% FLUSH
10.0000 mL | INTRAVENOUS | Status: DC | PRN
  Administered 2016-12-12: 10 mL
  Filled 2016-12-12: qty 10

## 2016-12-12 MED ORDER — PALONOSETRON HCL INJECTION 0.25 MG/5ML
INTRAVENOUS | Status: AC
  Filled 2016-12-12: qty 5

## 2016-12-12 MED ORDER — DEXAMETHASONE SODIUM PHOSPHATE 10 MG/ML IJ SOLN
INTRAMUSCULAR | Status: AC
  Filled 2016-12-12: qty 1

## 2016-12-12 MED ORDER — OXALIPLATIN CHEMO INJECTION 100 MG/20ML
85.5000 mg/m2 | Freq: Once | INTRAVENOUS | Status: AC
  Administered 2016-12-12: 200 mg via INTRAVENOUS
  Filled 2016-12-12: qty 40

## 2016-12-12 NOTE — Progress Notes (Signed)
Oncology Nurse Navigator Documentation  Oncology Nurse Navigator Flowsheets 12/12/2016  Navigator Location [facility]  Referral date to RadOnc/MedOnc -  Navigator Encounter Type Treatment  Abnormal Finding Date -  Confirmed Diagnosis Date -  Surgery Date -  Treatment Initiated Date 12/12/2016  Patient Visit Type MedOnc  Treatment Phase First Chemo Tx--CAPOX  Barriers/Navigation Needs Education  Education Other--reviewed how to take Xeloda and for how many days  Interventions Education  Education Method Verbal;Teach-back  Support Groups/Services GI Support Group  Acuity Level 1  Time Spent with Patient 15  Xeloda copay was $200. Informed this was most likely higher due to needing to meet his out of pocket deductible for 2018. He will call office if this continues to be a hardship for him. Confirmed he has his anti-emetics at home.

## 2016-12-12 NOTE — Patient Instructions (Addendum)
[facility] Discharge Instructions for Patients Receiving Chemotherapy  Today you received the following chemotherapy agents OXALIPLATIN  To help prevent nausea and vomiting after your treatment, we encourage you to take your nausea medication as directed by your MD.  Take Compazine 10 mg every 6 hours as needed for nausea.  DO Not  Drive  After  Taking  Compazine  As this  Medication  Can  Cause  Drowsiness. DO  NOT  Eat  Greasy  Nor  Spicy  Foods.   DO  Drink  Lots  Of  Fluids  At  Room  Temperature  As  Tolerated. Watch  For  Cold  Sensitivity.     If you develop nausea and vomiting that is not controlled by your nausea medication, call the clinic.   BELOW ARE SYMPTOMS THAT SHOULD BE REPORTED IMMEDIATELY:  *FEVER GREATER THAN 100.5 F  *CHILLS WITH OR WITHOUT FEVER  NAUSEA AND VOMITING THAT IS NOT CONTROLLED WITH YOUR NAUSEA MEDICATION  *UNUSUAL SHORTNESS OF BREATH  *UNUSUAL BRUISING OR BLEEDING  TENDERNESS IN MOUTH AND THROAT WITH OR WITHOUT PRESENCE OF ULCERS  *URINARY PROBLEMS  *BOWEL PROBLEMS  UNUSUAL RASH Items with * indicate a potential emergency and should be followed up as soon as possible.  Feel free to call the clinic you have any questions or concerns. The clinic phone number is [phone].  Please show the CHEMO ALERT CARD at check-in to the Emergency Department and triage nurse.  Oxaliplatin Injection What is this medicine? OXALIPLATIN (ox AL i PLA tin) is a chemotherapy drug. It targets fast dividing cells, like cancer cells, and causes these cells to die. This medicine is used to treat cancers of the colon and rectum, and many other cancers. This medicine may be used for other purposes; ask your health care provider or pharmacist if you have questions. COMMON BRAND NAME(S): Eloxatin What should I tell my health care provider before I take this medicine? They need to know if you have any of these conditions: -kidney disease -an unusual  or allergic reaction to oxaliplatin, other chemotherapy, other medicines, foods, dyes, or preservatives -pregnant or trying to get pregnant -breast-feeding How should I use this medicine? This drug is given as an infusion into a vein. It is administered in a hospital or clinic by a specially trained health care professional. Talk to your pediatrician regarding the use of this medicine in children. Special care may be needed. Overdosage: If you think you have taken too much of this medicine contact a poison control center or emergency room at once. NOTE: This medicine is only for you. Do not share this medicine with others. What if I miss a dose? It is important not to miss a dose. Call your doctor or health care professional if you are unable to keep an appointment. What may interact with this medicine? -medicines to increase blood counts like filgrastim, pegfilgrastim, sargramostim -probenecid -some antibiotics like amikacin, gentamicin, neomycin, polymyxin B, streptomycin, tobramycin -zalcitabine Talk to your doctor or health care professional before taking any of these medicines: -acetaminophen -aspirin -ibuprofen -ketoprofen -naproxen This list may not describe all possible interactions. Give your health care provider a list of all the medicines, herbs, non-prescription drugs, or dietary supplements you use. Also tell them if you smoke, drink alcohol, or use illegal drugs. Some items may interact with your medicine. What should I watch for while using this medicine? Your condition will be monitored carefully while you are receiving this medicine.   You will need important blood work done while you are taking this medicine. This medicine can make you more sensitive to cold. Do not drink cold drinks or use ice. Cover exposed skin before coming in contact with cold temperatures or cold objects. When out in cold weather wear warm clothing and cover your mouth and nose to warm the air that goes  into your lungs. Tell your doctor if you get sensitive to the cold. This drug may make you feel generally unwell. This is not uncommon, as chemotherapy can affect healthy cells as well as cancer cells. Report any side effects. Continue your course of treatment even though you feel ill unless your doctor tells you to stop. In some cases, you may be given additional medicines to help with side effects. Follow all directions for their use. Call your doctor or health care professional for advice if you get a fever, chills or sore throat, or other symptoms of a cold or flu. Do not treat yourself. This drug decreases your body's ability to fight infections. Try to avoid being around people who are sick. This medicine may increase your risk to bruise or bleed. Call your doctor or health care professional if you notice any unusual bleeding. Be careful brushing and flossing your teeth or using a toothpick because you may get an infection or bleed more easily. If you have any dental work done, tell your dentist you are receiving this medicine. Avoid taking products that contain aspirin, acetaminophen, ibuprofen, naproxen, or ketoprofen unless instructed by your doctor. These medicines may hide a fever. Do not become pregnant while taking this medicine. Women should inform their doctor if they wish to become pregnant or think they might be pregnant. There is a potential for serious side effects to an unborn child. Talk to your health care professional or pharmacist for more information. Do not breast-feed an infant while taking this medicine. Call your doctor or health care professional if you get diarrhea. Do not treat yourself. What side effects may I notice from receiving this medicine? Side effects that you should report to your doctor or health care professional as soon as possible: -allergic reactions like skin rash, itching or hives, swelling of the face, lips, or tongue -low blood counts - This drug may  decrease the number of white blood cells, red blood cells and platelets. You may be at increased risk for infections and bleeding. -signs of infection - fever or chills, cough, sore throat, pain or difficulty passing urine -signs of decreased platelets or bleeding - bruising, pinpoint red spots on the skin, black, tarry stools, nosebleeds -signs of decreased red blood cells - unusually weak or tired, fainting spells, lightheadedness -breathing problems -chest pain, pressure -cough -diarrhea -jaw tightness -mouth sores -nausea and vomiting -pain, swelling, redness or irritation at the injection site -pain, tingling, numbness in the hands or feet -problems with balance, talking, walking -redness, blistering, peeling or loosening of the skin, including inside the mouth -trouble passing urine or change in the amount of urine Side effects that usually do not require medical attention (report to your doctor or health care professional if they continue or are bothersome): -changes in vision -constipation -hair loss -loss of appetite -metallic taste in the mouth or changes in taste -stomach pain This list may not describe all possible side effects. Call your doctor for medical advice about side effects. You may report side effects to FDA at 1-800-FDA-1088. Where should I keep my medicine? This drug is given in   a hospital or clinic and will not be stored at home. NOTE: This sheet is a summary. It may not cover all possible information. If you have questions about this medicine, talk to your doctor, pharmacist, or health care provider.  2017 Elsevier/Gold Standard (2008-05-25 17:22:47)    

## 2016-12-12 NOTE — Progress Notes (Signed)
Okay to treat with platelets of 86 per Dr.Sherril. Will dose reduce oxaliplatin today.

## 2016-12-17 ENCOUNTER — Telehealth: Payer: Self-pay | Admitting: *Deleted

## 2016-12-17 NOTE — Telephone Encounter (Signed)
Call from pt asking what he can take for congestion. Reports productive cough- clear sputum. Reports he was weak, dizzy and achy with fever over the weekend. Denies fever today. Informed him it is OK to take OTC cold remedy. Instructed him to increase oral hydration. Pt denied any issues related to cold sensitivity, nausea or bowel issues.  Message to Dr. Benay Spice.

## 2016-12-24 ENCOUNTER — Other Ambulatory Visit (HOSPITAL_BASED_OUTPATIENT_CLINIC_OR_DEPARTMENT_OTHER): Payer: 59

## 2016-12-24 DIAGNOSIS — C186 Malignant neoplasm of descending colon: Secondary | ICD-10-CM

## 2016-12-24 LAB — CBC WITH DIFFERENTIAL/PLATELET
BASO%: 0.6 % (ref 0.0–2.0)
BASOS ABS: 0 10*3/uL (ref 0.0–0.1)
EOS%: 1.2 % (ref 0.0–7.0)
Eosinophils Absolute: 0.1 10*3/uL (ref 0.0–0.5)
HEMATOCRIT: 40.5 % (ref 38.4–49.9)
HGB: 13.7 g/dL (ref 13.0–17.1)
LYMPH#: 2 10*3/uL (ref 0.9–3.3)
LYMPH%: 25.2 % (ref 14.0–49.0)
MCH: 28.9 pg (ref 27.2–33.4)
MCHC: 33.8 g/dL (ref 32.0–36.0)
MCV: 85.5 fL (ref 79.3–98.0)
MONO#: 0.8 10*3/uL (ref 0.1–0.9)
MONO%: 10.3 % (ref 0.0–14.0)
NEUT#: 5 10*3/uL (ref 1.5–6.5)
NEUT%: 62.7 % (ref 39.0–75.0)
Platelets: 97 10*3/uL — ABNORMAL LOW (ref 140–400)
RBC: 4.73 10*6/uL (ref 4.20–5.82)
RDW: 13.3 % (ref 11.0–14.6)
WBC: 8 10*3/uL (ref 4.0–10.3)

## 2016-12-24 NOTE — Telephone Encounter (Signed)
Oral Chemotherapy Pharmacist Encounter  Patient picked up Xeloda prescription from [facility] on 12/06/16, copay $200  Johny Drilling, PharmD, BCPS, BCOP 12/24/2016  4:39 PM [facility] [phone]

## 2016-12-30 ENCOUNTER — Other Ambulatory Visit: Payer: Self-pay | Admitting: Oncology

## 2017-01-02 ENCOUNTER — Ambulatory Visit: Payer: 59

## 2017-01-02 ENCOUNTER — Other Ambulatory Visit (HOSPITAL_BASED_OUTPATIENT_CLINIC_OR_DEPARTMENT_OTHER): Payer: 59

## 2017-01-02 ENCOUNTER — Ambulatory Visit (HOSPITAL_BASED_OUTPATIENT_CLINIC_OR_DEPARTMENT_OTHER): Payer: 59

## 2017-01-02 ENCOUNTER — Ambulatory Visit (HOSPITAL_BASED_OUTPATIENT_CLINIC_OR_DEPARTMENT_OTHER): Payer: 59 | Admitting: Oncology

## 2017-01-02 VITALS — BP 127/86 | HR 94 | Temp 99.2°F | Resp 18 | Ht 71.0 in | Wt 229.9 lb

## 2017-01-02 DIAGNOSIS — C186 Malignant neoplasm of descending colon: Secondary | ICD-10-CM | POA: Diagnosis not present

## 2017-01-02 DIAGNOSIS — I10 Essential (primary) hypertension: Secondary | ICD-10-CM | POA: Diagnosis not present

## 2017-01-02 DIAGNOSIS — Z5111 Encounter for antineoplastic chemotherapy: Secondary | ICD-10-CM | POA: Diagnosis not present

## 2017-01-02 LAB — COMPREHENSIVE METABOLIC PANEL
ALBUMIN: 3.6 g/dL (ref 3.5–5.0)
ALT: 11 U/L (ref 0–55)
AST: 15 U/L (ref 5–34)
Alkaline Phosphatase: 62 U/L (ref 40–150)
Anion Gap: 10 mEq/L (ref 3–11)
BUN: 12 mg/dL (ref 7.0–26.0)
CALCIUM: 9.3 mg/dL (ref 8.4–10.4)
CHLORIDE: 108 meq/L (ref 98–109)
CO2: 23 mEq/L (ref 22–29)
CREATININE: 0.9 mg/dL (ref 0.7–1.3)
EGFR: >90 mL/min/{1.73_m2} (ref >90)
GLUCOSE: 128 mg/dL (ref 70–140)
POTASSIUM: 3.6 meq/L (ref 3.5–5.1)
SODIUM: 141 meq/L (ref 136–145)
Total Bilirubin: 0.67 mg/dL (ref 0.20–1.20)
Total Protein: 7.3 g/dL (ref 6.4–8.3)

## 2017-01-02 LAB — CBC WITH DIFFERENTIAL/PLATELET
BASO%: 0.7 % (ref 0.0–2.0)
Basophils Absolute: 0.1 10*3/uL (ref 0.0–0.1)
EOS%: 1.7 % (ref 0.0–7.0)
Eosinophils Absolute: 0.1 10*3/uL (ref 0.0–0.5)
HEMATOCRIT: 40.3 % (ref 38.4–49.9)
HEMOGLOBIN: 13.5 g/dL (ref 13.0–17.1)
LYMPH%: 26.1 % (ref 14.0–49.0)
MCH: 28.8 pg (ref 27.2–33.4)
MCHC: 33.4 g/dL (ref 32.0–36.0)
MCV: 86.2 fL (ref 79.3–98.0)
MONO#: 1 10*3/uL — ABNORMAL HIGH (ref 0.1–0.9)
MONO%: 13.3 % (ref 0.0–14.0)
NEUT#: 4.5 10*3/uL (ref 1.5–6.5)
NEUT%: 58.2 % (ref 39.0–75.0)
Platelets: 110 10*3/uL — ABNORMAL LOW (ref 140–400)
RBC: 4.68 10*6/uL (ref 4.20–5.82)
RDW: 14.2 % (ref 11.0–14.6)
WBC: 7.8 10*3/uL (ref 4.0–10.3)
lymph#: 2 10*3/uL (ref 0.9–3.3)

## 2017-01-02 MED ORDER — CAPECITABINE 500 MG PO TABS: 2000.0000 mg | ORAL_TABLET | Freq: Two times a day (BID) | ORAL | 0 refills | Status: DC

## 2017-01-02 MED ORDER — SODIUM CHLORIDE 0.9% FLUSH
10.0000 mL | INTRAVENOUS | Status: DC | PRN
  Administered 2017-01-02: 10 mL
  Filled 2017-01-02: qty 10

## 2017-01-02 MED ORDER — HEPARIN SOD (PORK) LOCK FLUSH 100 UNIT/ML IV SOLN
500.0000 [IU] | Freq: Once | INTRAVENOUS | Status: AC | PRN
  Administered 2017-01-02: 500 [IU]
  Filled 2017-01-02: qty 5

## 2017-01-02 MED ORDER — ONDANSETRON HCL 8 MG PO TABS: 8.0000 mg | ORAL_TABLET | Freq: Three times a day (TID) | ORAL | 1 refills | Status: DC | PRN

## 2017-01-02 MED ORDER — DEXTROSE 5 % IV SOLN
87.0000 mg/m2 | Freq: Once | INTRAVENOUS | Status: AC
  Administered 2017-01-02: 200 mg via INTRAVENOUS
  Filled 2017-01-02: qty 40

## 2017-01-02 MED ORDER — PALONOSETRON HCL INJECTION 0.25 MG/5ML
0.2500 mg | Freq: Once | INTRAVENOUS | Status: AC
  Administered 2017-01-02: 0.25 mg via INTRAVENOUS

## 2017-01-02 MED ORDER — PALONOSETRON HCL INJECTION 0.25 MG/5ML
INTRAVENOUS | Status: AC
  Filled 2017-01-02: qty 5

## 2017-01-02 MED ORDER — SODIUM CHLORIDE 0.9 % IV SOLN
Freq: Once | INTRAVENOUS | Status: AC
  Administered 2017-01-02: 14:00:00 via INTRAVENOUS
  Filled 2017-01-02: qty 5

## 2017-01-02 MED ORDER — DEXTROSE 5 % IV SOLN
Freq: Once | INTRAVENOUS | Status: AC
  Administered 2017-01-02: 14:00:00 via INTRAVENOUS

## 2017-01-02 MED FILL — ONDANSETRON HCL 8 MG TABLET: 8 | 7 days supply | Qty: 20 | Fill #0

## 2017-01-02 MED FILL — CAPECITABINE 500 MG TABLET: 500 | 14 days supply | Qty: 112 | Fill #0

## 2017-01-02 NOTE — Patient Instructions (Signed)
[facility] Discharge Instructions for Patients Receiving Chemotherapy  Today you received the following chemotherapy agents OXALIPLATIN  To help prevent nausea and vomiting after your treatment, we encourage you to take your nausea medication as directed by your MD.  Take Compazine 10 mg every 6 hours as needed for nausea.  DO Not  Drive  After  Taking  Compazine  As this  Medication  Can  Cause  Drowsiness. DO  NOT  Eat  Greasy  Nor  Spicy  Foods.   DO  Drink  Lots  Of  Fluids  At  Room  Temperature  As  Tolerated. Watch  For  Cold  Sensitivity.     If you develop nausea and vomiting that is not controlled by your nausea medication, call the clinic.   BELOW ARE SYMPTOMS THAT SHOULD BE REPORTED IMMEDIATELY:  *FEVER GREATER THAN 100.5 F  *CHILLS WITH OR WITHOUT FEVER  NAUSEA AND VOMITING THAT IS NOT CONTROLLED WITH YOUR NAUSEA MEDICATION  *UNUSUAL SHORTNESS OF BREATH  *UNUSUAL BRUISING OR BLEEDING  TENDERNESS IN MOUTH AND THROAT WITH OR WITHOUT PRESENCE OF ULCERS  *URINARY PROBLEMS  *BOWEL PROBLEMS  UNUSUAL RASH Items with * indicate a potential emergency and should be followed up as soon as possible.  Feel free to call the clinic you have any questions or concerns. The clinic phone number is [phone].  Please show the CHEMO ALERT CARD at check-in to the Emergency Department and triage nurse.  Oxaliplatin Injection What is this medicine? OXALIPLATIN (ox AL i PLA tin) is a chemotherapy drug. It targets fast dividing cells, like cancer cells, and causes these cells to die. This medicine is used to treat cancers of the colon and rectum, and many other cancers. This medicine may be used for other purposes; ask your health care provider or pharmacist if you have questions. COMMON BRAND NAME(S): Eloxatin What should I tell my health care provider before I take this medicine? They need to know if you have any of these conditions: -kidney disease -an unusual  or allergic reaction to oxaliplatin, other chemotherapy, other medicines, foods, dyes, or preservatives -pregnant or trying to get pregnant -breast-feeding How should I use this medicine? This drug is given as an infusion into a vein. It is administered in a hospital or clinic by a specially trained health care professional. Talk to your pediatrician regarding the use of this medicine in children. Special care may be needed. Overdosage: If you think you have taken too much of this medicine contact a poison control center or emergency room at once. NOTE: This medicine is only for you. Do not share this medicine with others. What if I miss a dose? It is important not to miss a dose. Call your doctor or health care professional if you are unable to keep an appointment. What may interact with this medicine? -medicines to increase blood counts like filgrastim, pegfilgrastim, sargramostim -probenecid -some antibiotics like amikacin, gentamicin, neomycin, polymyxin B, streptomycin, tobramycin -zalcitabine Talk to your doctor or health care professional before taking any of these medicines: -acetaminophen -aspirin -ibuprofen -ketoprofen -naproxen This list may not describe all possible interactions. Give your health care provider a list of all the medicines, herbs, non-prescription drugs, or dietary supplements you use. Also tell them if you smoke, drink alcohol, or use illegal drugs. Some items may interact with your medicine. What should I watch for while using this medicine? Your condition will be monitored carefully while you are receiving this medicine.   You will need important blood work done while you are taking this medicine. This medicine can make you more sensitive to cold. Do not drink cold drinks or use ice. Cover exposed skin before coming in contact with cold temperatures or cold objects. When out in cold weather wear warm clothing and cover your mouth and nose to warm the air that goes  into your lungs. Tell your doctor if you get sensitive to the cold. This drug may make you feel generally unwell. This is not uncommon, as chemotherapy can affect healthy cells as well as cancer cells. Report any side effects. Continue your course of treatment even though you feel ill unless your doctor tells you to stop. In some cases, you may be given additional medicines to help with side effects. Follow all directions for their use. Call your doctor or health care professional for advice if you get a fever, chills or sore throat, or other symptoms of a cold or flu. Do not treat yourself. This drug decreases your body's ability to fight infections. Try to avoid being around people who are sick. This medicine may increase your risk to bruise or bleed. Call your doctor or health care professional if you notice any unusual bleeding. Be careful brushing and flossing your teeth or using a toothpick because you may get an infection or bleed more easily. If you have any dental work done, tell your dentist you are receiving this medicine. Avoid taking products that contain aspirin, acetaminophen, ibuprofen, naproxen, or ketoprofen unless instructed by your doctor. These medicines may hide a fever. Do not become pregnant while taking this medicine. Women should inform their doctor if they wish to become pregnant or think they might be pregnant. There is a potential for serious side effects to an unborn child. Talk to your health care professional or pharmacist for more information. Do not breast-feed an infant while taking this medicine. Call your doctor or health care professional if you get diarrhea. Do not treat yourself. What side effects may I notice from receiving this medicine? Side effects that you should report to your doctor or health care professional as soon as possible: -allergic reactions like skin rash, itching or hives, swelling of the face, lips, or tongue -low blood counts - This drug may  decrease the number of white blood cells, red blood cells and platelets. You may be at increased risk for infections and bleeding. -signs of infection - fever or chills, cough, sore throat, pain or difficulty passing urine -signs of decreased platelets or bleeding - bruising, pinpoint red spots on the skin, black, tarry stools, nosebleeds -signs of decreased red blood cells - unusually weak or tired, fainting spells, lightheadedness -breathing problems -chest pain, pressure -cough -diarrhea -jaw tightness -mouth sores -nausea and vomiting -pain, swelling, redness or irritation at the injection site -pain, tingling, numbness in the hands or feet -problems with balance, talking, walking -redness, blistering, peeling or loosening of the skin, including inside the mouth -trouble passing urine or change in the amount of urine Side effects that usually do not require medical attention (report to your doctor or health care professional if they continue or are bothersome): -changes in vision -constipation -hair loss -loss of appetite -metallic taste in the mouth or changes in taste -stomach pain This list may not describe all possible side effects. Call your doctor for medical advice about side effects. You may report side effects to FDA at 1-800-FDA-1088. Where should I keep my medicine? This drug is given in   a hospital or clinic and will not be stored at home. NOTE: This sheet is a summary. It may not cover all possible information. If you have questions about this medicine, talk to your doctor, pharmacist, or health care provider.  2017 Elsevier/Gold Standard (2008-05-25 17:22:47)    

## 2017-01-02 NOTE — Progress Notes (Signed)
  [place] OFFICE PROGRESS NOTE   Diagnosis: Colon cancer  INTERVAL HISTORY:   Jermaine Moody returns as scheduled. He completed cycle 1 CAPOX beginning 12/12/2016. He reports developing nausea and dizziness beginning on day 4. He had fever for one day on day 4. Cold sensitivity lasted 3 days. No neuropathy symptoms at present. No mouth sores or diarrhea. Compazine did not relieve the nausea. He is eating and his bowels are moving.  Objective:  Vital signs in last 24 hours:  Blood pressure 127/86, pulse 94, temperature 99.2 F (37.3 C), temperature source Oral, resp. rate 18, height '5\' 11"'$  (1.803 m), weight 229 lb 14.4 oz (104.3 kg), SpO2 99 %.    HEENT: No thrush or ulcers Resp: Lungs clear bilaterally Cardio: Regular rate and rhythm GI: No hepatosplenomegaly, nontender Vascular: No leg edema  Skin: Hyperpigmentation of the hands, no erythema or skin breakdown   Portacath/erythema in a cleaning solution distribution spine and the poor, no tenderness  Lab Results:  Lab Results  Component Value Date   WBC 7.8 01/02/2017   HGB 13.5 01/02/2017   HCT 40.3 01/02/2017   MCV 86.2 01/02/2017   PLT 110 (L) 01/02/2017   NEUTROABS 4.5 01/02/2017     Medications: I have reviewed the patient's current medications.  Assessment/Plan: 1. Well-differentiated adenocarcinoma of the descending colon, stage III (T3 N1), status post a left colectomy 11/14/2016 ? 1/20 lymph nodes positive ? MSI-stable, no loss of mismatch repair protein expression ? Cycle 1 CAPOX 12/12/2016 ? Cycle 2 CAPOX 01/02/2017  2. History of hepatitis C-treated with medical therapy  3.   Hypertension  4.   Nausea following cycle 1 CAPOX-emend added with cycle 2    Disposition:  Jermaine Moody completed one cycle of adjuvant CAPOX. The chemotherapy was complicated by delayed nausea following the oxaliplatin. Emend will be added with this cycle. He will contact us if he has significant nausea  following chemotherapy. He will continue Compazine and use Zofran as needed after day 3.  Jermaine Moody will return for an office visit and chemotherapy in 3 weeks.  25 minutes were spent with the patient today. The majority of the time was used for counseling and coordination of  care.    Betsy Coder, MD  01/02/2017  2:09 PM

## 2017-01-03 ENCOUNTER — Telehealth: Payer: Self-pay | Admitting: *Deleted

## 2017-01-03 NOTE — Telephone Encounter (Signed)
Per 2/21 LOS and staff message I have scheduled appts. Notified the scheduler 

## 2017-01-15 ENCOUNTER — Telehealth: Payer: Self-pay | Admitting: Oncology

## 2017-01-15 NOTE — Telephone Encounter (Signed)
Confirmed apponitments on  3/14

## 2017-01-20 ENCOUNTER — Other Ambulatory Visit: Payer: Self-pay | Admitting: Oncology

## 2017-01-23 ENCOUNTER — Ambulatory Visit: Payer: 59

## 2017-01-23 ENCOUNTER — Other Ambulatory Visit (HOSPITAL_BASED_OUTPATIENT_CLINIC_OR_DEPARTMENT_OTHER): Payer: 59

## 2017-01-23 ENCOUNTER — Other Ambulatory Visit: Payer: Self-pay | Admitting: *Deleted

## 2017-01-23 ENCOUNTER — Ambulatory Visit (HOSPITAL_BASED_OUTPATIENT_CLINIC_OR_DEPARTMENT_OTHER): Payer: 59

## 2017-01-23 ENCOUNTER — Ambulatory Visit (HOSPITAL_BASED_OUTPATIENT_CLINIC_OR_DEPARTMENT_OTHER): Payer: 59 | Admitting: Oncology

## 2017-01-23 VITALS — BP 148/100

## 2017-01-23 VITALS — BP 138/100 | HR 96 | Temp 98.1°F | Resp 19 | Wt 224.5 lb

## 2017-01-23 DIAGNOSIS — I10 Essential (primary) hypertension: Secondary | ICD-10-CM

## 2017-01-23 DIAGNOSIS — C186 Malignant neoplasm of descending colon: Secondary | ICD-10-CM

## 2017-01-23 DIAGNOSIS — Z5111 Encounter for antineoplastic chemotherapy: Secondary | ICD-10-CM

## 2017-01-23 LAB — COMPREHENSIVE METABOLIC PANEL
ALBUMIN: 3.6 g/dL (ref 3.5–5.0)
ALK PHOS: 66 U/L (ref 40–150)
ALT: 11 U/L (ref 0–55)
AST: 15 U/L (ref 5–34)
Anion Gap: 11 mEq/L (ref 3–11)
BILIRUBIN TOTAL: 0.47 mg/dL (ref 0.20–1.20)
BUN: 10.1 mg/dL (ref 7.0–26.0)
CALCIUM: 9.5 mg/dL (ref 8.4–10.4)
CO2: 25 mEq/L (ref 22–29)
CREATININE: 0.9 mg/dL (ref 0.7–1.3)
Chloride: 105 mEq/L (ref 98–109)
EGFR: >90 mL/min/{1.73_m2} (ref >90)
Glucose: 112 mg/dl (ref 70–140)
Potassium: 3.6 mEq/L (ref 3.5–5.1)
Sodium: 141 mEq/L (ref 136–145)
TOTAL PROTEIN: 7.5 g/dL (ref 6.4–8.3)

## 2017-01-23 LAB — CBC WITH DIFFERENTIAL/PLATELET
BASO%: 0.6 % (ref 0.0–2.0)
Basophils Absolute: 0 10*3/uL (ref 0.0–0.1)
EOS ABS: 0.1 10*3/uL (ref 0.0–0.5)
EOS%: 1.8 % (ref 0.0–7.0)
HEMATOCRIT: 38.9 % (ref 38.4–49.9)
HEMOGLOBIN: 13.6 g/dL (ref 13.0–17.1)
LYMPH#: 1.9 10*3/uL (ref 0.9–3.3)
LYMPH%: 29.8 % (ref 14.0–49.0)
MCH: 29.8 pg (ref 27.2–33.4)
MCHC: 35 g/dL (ref 32.0–36.0)
MCV: 85.3 fL (ref 79.3–98.0)
MONO#: 0.9 10*3/uL (ref 0.1–0.9)
MONO%: 13.5 % (ref 0.0–14.0)
NEUT%: 54.3 % (ref 39.0–75.0)
NEUTROS ABS: 3.5 10*3/uL (ref 1.5–6.5)
NRBC: 0 % (ref 0–0)
PLATELETS: 107 10*3/uL — AB (ref 140–400)
RBC: 4.56 10*6/uL (ref 4.20–5.82)
RDW: 17.5 % — ABNORMAL HIGH (ref 11.0–14.6)
WBC: 6.5 10*3/uL (ref 4.0–10.3)

## 2017-01-23 MED ORDER — PALONOSETRON HCL INJECTION 0.25 MG/5ML
INTRAVENOUS | Status: AC
  Filled 2017-01-23: qty 5

## 2017-01-23 MED ORDER — SODIUM CHLORIDE 0.9 % IV SOLN
Freq: Once | INTRAVENOUS | Status: AC
  Administered 2017-01-23: 10:00:00 via INTRAVENOUS
  Filled 2017-01-23: qty 5

## 2017-01-23 MED ORDER — ONDANSETRON HCL 8 MG PO TABS: 8.0000 mg | ORAL_TABLET | Freq: Three times a day (TID) | ORAL | 1 refills | Status: DC | PRN

## 2017-01-23 MED ORDER — DEXTROSE 5 % IV SOLN
Freq: Once | INTRAVENOUS | Status: AC
  Administered 2017-01-23: 10:00:00 via INTRAVENOUS

## 2017-01-23 MED ORDER — PALONOSETRON HCL INJECTION 0.25 MG/5ML
0.2500 mg | Freq: Once | INTRAVENOUS | Status: AC
  Administered 2017-01-23: 0.25 mg via INTRAVENOUS

## 2017-01-23 MED ORDER — HEPARIN SOD (PORK) LOCK FLUSH 100 UNIT/ML IV SOLN
500.0000 [IU] | Freq: Once | INTRAVENOUS | Status: AC | PRN
  Administered 2017-01-23: 500 [IU]
  Filled 2017-01-23: qty 5

## 2017-01-23 MED ORDER — CAPECITABINE 500 MG PO TABS: 2000.0000 mg | ORAL_TABLET | Freq: Two times a day (BID) | ORAL | 0 refills | Status: DC

## 2017-01-23 MED ORDER — OXALIPLATIN CHEMO INJECTION 100 MG/20ML
85.5000 mg/m2 | Freq: Once | INTRAVENOUS | Status: AC
  Administered 2017-01-23: 200 mg via INTRAVENOUS
  Filled 2017-01-23: qty 40

## 2017-01-23 MED ORDER — SODIUM CHLORIDE 0.9% FLUSH
10.0000 mL | INTRAVENOUS | Status: DC | PRN
  Administered 2017-01-23: 10 mL
  Filled 2017-01-23: qty 10

## 2017-01-23 MED FILL — CAPECITABINE 500 MG TABLET: 500 | 14 days supply | Qty: 112 | Fill #0

## 2017-01-23 NOTE — Progress Notes (Signed)
  [place] OFFICE PROGRESS NOTE   Diagnosis: Colon cancer  INTERVAL HISTORY:   Jermaine Moody returns as scheduled. He completed cycle 2 CAPOX beginning 01/02/2017. No mouth sores, diarrhea, or hand/foot pain. He has developed hyperpigmentation of the hands and feet. He had cold sensitivity following chemotherapy. This has resolved. No other neuropathy symptoms.  He had discomfort at the left lateral chest with associated mild dyspnea last week. He reports the discomfort was worse with certain position change and lying on the left side. The pain has resolved. No leg swelling or pain. No dyspnea at present. The discomfort was intermittent and lasted for one to 2 days.  Objective:  Vital signs in last 24 hours:  Blood pressure (!) 138/100, pulse 96, temperature 98.1 F (36.7 C), temperature source Oral, resp. rate 19, weight 224 lb 8 oz (101.8 kg), SpO2 100 %.    HEENT: No thrush or ulcers Resp: Lungs clear bilaterally in the anterior and posterior lung fields, no respiratory distress Cardio: Regular rate and rhythm GI: No hepatomegaly, no mass, nontender Vascular: No leg edema  Skin: Hyperpigmentation of the hands and feet with dry flaking of the skin at the palms and soles. No erythema or skin breakdown   Portacath/PICC-without erythema  Lab Results:  Lab Results  Component Value Date   WBC 6.5 01/23/2017   HGB 13.6 01/23/2017   HCT 38.9 01/23/2017   MCV 85.3 01/23/2017   PLT 107 (L) 01/23/2017   NEUTROABS 3.5 01/23/2017     Medications: I have reviewed the patient's current medications.  Assessment/Plan: 1. Well-differentiated adenocarcinoma of the descending colon, stage III (T3 N1), status post a left colectomy 11/14/2016 ? 1/20 lymph nodes positive ? MSI-stable, no loss of mismatch repair protein expression ? Cycle 1 CAPOX 12/12/2016 ? Cycle 2 CAPOX 01/02/2017 ? Cycle 3 CAPOX 01/23/2017  2. History of hepatitis C-treated with medical  therapy  3. Hypertension  4.   Nausea following cycle 1 CAPOX-emend added with cycle 2    Disposition:  He appears stable for the past proceed with cycle 3 CAPOX today. He will return for an office visit and chemotherapy in 3 weeks. The etiology of the transient left-sided chest discomfort last week is unclear. I suspect this was related to a benign musculoskeletal condition. I have a low clinical suspicion for a pulmonary embolism or primary cardiopulmonary process.    Betsy Coder, MD  01/23/2017  9:32 AM

## 2017-01-23 NOTE — Patient Instructions (Signed)
[place] Discharge Instructions for Patients Receiving Chemotherapy  Today you received the following chemotherapy agents OXALIPLATIN  To help prevent nausea and vomiting after your treatment, we encourage you to take your nausea medication as directed by your MD.  Take Compazine 10 mg every 6 hours as needed for nausea.  DO Not  Drive  After  Taking  Compazine  As this  Medication  Can  Cause  Drowsiness. DO  NOT  Eat  Greasy  Nor  Spicy  Foods.   DO  Drink  Lots  Of  Fluids  At  Room  Temperature  As  Tolerated. Watch  For  [employer].     If you develop nausea and vomiting that is not controlled by your nausea medication, call the clinic.   BELOW ARE SYMPTOMS THAT SHOULD BE REPORTED IMMEDIATELY:  *FEVER GREATER THAN 100.5 F  *CHILLS WITH OR WITHOUT FEVER  NAUSEA AND VOMITING THAT IS NOT CONTROLLED WITH YOUR NAUSEA MEDICATION  *UNUSUAL SHORTNESS OF BREATH  *UNUSUAL BRUISING OR BLEEDING  TENDERNESS IN MOUTH AND THROAT WITH OR WITHOUT PRESENCE OF ULCERS  *URINARY PROBLEMS  *BOWEL PROBLEMS  UNUSUAL RASH Items with * indicate a potential emergency and should be followed up as soon as possible.  Feel free to call the clinic you have any questions or concerns. The clinic phone number is [phone].  Please show the [place] at check-in to the Emergency Department and triage nurse.  Oxaliplatin Injection What is this medicine? OXALIPLATIN (ox AL i PLA tin) is a chemotherapy drug. It targets fast dividing cells, like cancer cells, and causes these cells to die. This medicine is used to treat cancers of the colon and rectum, and many other cancers. This medicine may be used for other purposes; ask your health care provider or pharmacist if you have questions. COMMON BRAND NAME(S): Eloxatin What should I tell my health care provider before I take this medicine? They need to know if you have any of these conditions: -kidney disease -an unusual  or allergic reaction to oxaliplatin, other chemotherapy, other medicines, foods, dyes, or preservatives -pregnant or trying to get pregnant -breast-feeding How should I use this medicine? This drug is given as an infusion into a vein. It is administered in a hospital or clinic by a specially trained health care professional. Talk to your pediatrician regarding the use of this medicine in children. Special care may be needed. Overdosage: If you think you have taken too much of this medicine contact a poison control center or emergency room at once. NOTE: This medicine is only for you. Do not share this medicine with others. What if I miss a dose? It is important not to miss a dose. Call your doctor or health care professional if you are unable to keep an appointment. What may interact with this medicine? -medicines to increase blood counts like filgrastim, pegfilgrastim, sargramostim -probenecid -some antibiotics like amikacin, gentamicin, neomycin, polymyxin B, streptomycin, tobramycin -zalcitabine Talk to your doctor or health care professional before taking any of these medicines: -acetaminophen -aspirin -ibuprofen -ketoprofen -naproxen This list may not describe all possible interactions. Give your health care provider a list of all the medicines, herbs, non-prescription drugs, or dietary supplements you use. Also tell them if you smoke, drink alcohol, or use illegal drugs. Some items may interact with your medicine. What should I watch for while using this medicine? Your condition will be monitored carefully while you are receiving this medicine.  You will need important blood work done while you are taking this medicine. This medicine can make you more sensitive to cold. Do not drink cold drinks or use ice. Cover exposed skin before coming in contact with cold temperatures or cold objects. When out in cold weather wear warm clothing and cover your mouth and nose to warm the air that goes  into your lungs. Tell your doctor if you get sensitive to the cold. This drug may make you feel generally unwell. This is not uncommon, as chemotherapy can affect healthy cells as well as cancer cells. Report any side effects. Continue your course of treatment even though you feel ill unless your doctor tells you to stop. In some cases, you may be given additional medicines to help with side effects. Follow all directions for their use. Call your doctor or health care professional for advice if you get a fever, chills or sore throat, or other symptoms of a cold or flu. Do not treat yourself. This drug decreases your body's ability to fight infections. Try to avoid being around people who are sick. This medicine may increase your risk to bruise or bleed. Call your doctor or health care professional if you notice any unusual bleeding. Be careful brushing and flossing your teeth or using a toothpick because you may get an infection or bleed more easily. If you have any dental work done, tell your dentist you are receiving this medicine. Avoid taking products that contain aspirin, acetaminophen, ibuprofen, naproxen, or ketoprofen unless instructed by your doctor. These medicines may hide a fever. Do not become pregnant while taking this medicine. Women should inform their doctor if they wish to become pregnant or think they might be pregnant. There is a potential for serious side effects to an unborn child. Talk to your health care professional or pharmacist for more information. Do not breast-feed an infant while taking this medicine. Call your doctor or health care professional if you get diarrhea. Do not treat yourself. What side effects may I notice from receiving this medicine? Side effects that you should report to your doctor or health care professional as soon as possible: -allergic reactions like skin rash, itching or hives, swelling of the face, lips, or tongue -low blood counts - This drug may  decrease the number of white blood cells, red blood cells and platelets. You may be at increased risk for infections and bleeding. -signs of infection - fever or chills, cough, sore throat, pain or difficulty passing urine -signs of decreased platelets or bleeding - bruising, pinpoint red spots on the skin, black, tarry stools, nosebleeds -signs of decreased red blood cells - unusually weak or tired, fainting spells, lightheadedness -breathing problems -chest pain, pressure -cough -diarrhea -jaw tightness -mouth sores -nausea and vomiting -pain, swelling, redness or irritation at the injection site -pain, tingling, numbness in the hands or feet -problems with balance, talking, walking -redness, blistering, peeling or loosening of the skin, including inside the mouth -trouble passing urine or change in the amount of urine Side effects that usually do not require medical attention (report to your doctor or health care professional if they continue or are bothersome): -changes in vision -constipation -hair loss -loss of appetite -metallic taste in the mouth or changes in taste -stomach pain This list may not describe all possible side effects. Call your doctor for medical advice about side effects. You may report side effects to FDA at 1-800-FDA-1088. Where should I keep my medicine? This drug is given in  a hospital or clinic and will not be stored at home. NOTE: This sheet is a summary. It may not cover all possible information. If you have questions about this medicine, talk to your doctor, pharmacist, or health care provider.  2017 Elsevier/Gold Standard (2008-05-25 17:22:47)

## 2017-01-24 ENCOUNTER — Encounter: Payer: Self-pay | Admitting: Gastroenterology

## 2017-02-04 MED FILL — BENAZEPRIL HCL 20 MG TABLET: 20 | 90 days supply | Qty: 90 | Fill #2

## 2017-02-04 MED FILL — HYDROCHLOROTHIAZIDE 12.5 MG: 12.5 | 90 days supply | Qty: 90 | Fill #2

## 2017-02-10 ENCOUNTER — Other Ambulatory Visit: Payer: Self-pay | Admitting: Oncology

## 2017-02-12 ENCOUNTER — Other Ambulatory Visit: Payer: Self-pay | Admitting: *Deleted

## 2017-02-12 DIAGNOSIS — C186 Malignant neoplasm of descending colon: Secondary | ICD-10-CM

## 2017-02-13 ENCOUNTER — Ambulatory Visit (HOSPITAL_BASED_OUTPATIENT_CLINIC_OR_DEPARTMENT_OTHER): Payer: 59

## 2017-02-13 ENCOUNTER — Other Ambulatory Visit (HOSPITAL_BASED_OUTPATIENT_CLINIC_OR_DEPARTMENT_OTHER): Payer: 59

## 2017-02-13 ENCOUNTER — Other Ambulatory Visit: Payer: Self-pay | Admitting: *Deleted

## 2017-02-13 ENCOUNTER — Ambulatory Visit (HOSPITAL_BASED_OUTPATIENT_CLINIC_OR_DEPARTMENT_OTHER): Payer: 59 | Admitting: Oncology

## 2017-02-13 ENCOUNTER — Ambulatory Visit: Payer: 59

## 2017-02-13 VITALS — BP 154/98 | HR 85 | Temp 98.5°F | Resp 18 | Ht 71.0 in | Wt 226.8 lb

## 2017-02-13 DIAGNOSIS — C186 Malignant neoplasm of descending colon: Secondary | ICD-10-CM

## 2017-02-13 DIAGNOSIS — Z5111 Encounter for antineoplastic chemotherapy: Secondary | ICD-10-CM | POA: Diagnosis not present

## 2017-02-13 DIAGNOSIS — I10 Essential (primary) hypertension: Secondary | ICD-10-CM | POA: Diagnosis not present

## 2017-02-13 DIAGNOSIS — Z95828 Presence of other vascular implants and grafts: Secondary | ICD-10-CM

## 2017-02-13 LAB — COMPREHENSIVE METABOLIC PANEL
ALT: 16 U/L (ref 0–55)
AST: 21 U/L (ref 5–34)
Albumin: 3.6 g/dL (ref 3.5–5.0)
Alkaline Phosphatase: 60 U/L (ref 40–150)
Anion Gap: 10 mEq/L (ref 3–11)
BILIRUBIN TOTAL: 0.56 mg/dL (ref 0.20–1.20)
BUN: 11.4 mg/dL (ref 7.0–26.0)
CO2: 23 meq/L (ref 22–29)
Calcium: 9.3 mg/dL (ref 8.4–10.4)
Chloride: 109 mEq/L (ref 98–109)
Creatinine: 0.8 mg/dL (ref 0.7–1.3)
GLUCOSE: 132 mg/dL (ref 70–140)
POTASSIUM: 3.4 meq/L — AB (ref 3.5–5.1)
SODIUM: 142 meq/L (ref 136–145)
TOTAL PROTEIN: 7.2 g/dL (ref 6.4–8.3)

## 2017-02-13 LAB — CBC WITH DIFFERENTIAL/PLATELET
BASO%: 1 % (ref 0.0–2.0)
Basophils Absolute: 0.1 10*3/uL (ref 0.0–0.1)
EOS ABS: 0.1 10*3/uL (ref 0.0–0.5)
EOS%: 1.7 % (ref 0.0–7.0)
HCT: 40.6 % (ref 38.4–49.9)
HEMOGLOBIN: 13.6 g/dL (ref 13.0–17.1)
LYMPH%: 28.7 % (ref 14.0–49.0)
MCH: 30 pg (ref 27.2–33.4)
MCHC: 33.6 g/dL (ref 32.0–36.0)
MCV: 89.3 fL (ref 79.3–98.0)
MONO#: 0.9 10*3/uL (ref 0.1–0.9)
MONO%: 15 % — AB (ref 0.0–14.0)
NEUT%: 53.6 % (ref 39.0–75.0)
NEUTROS ABS: 3.2 10*3/uL (ref 1.5–6.5)
Platelets: 83 10*3/uL — ABNORMAL LOW (ref 140–400)
RBC: 4.55 10*6/uL (ref 4.20–5.82)
RDW: 19.8 % — AB (ref 11.0–14.6)
WBC: 6 10*3/uL (ref 4.0–10.3)
lymph#: 1.7 10*3/uL (ref 0.9–3.3)

## 2017-02-13 MED ORDER — SODIUM CHLORIDE 0.9% FLUSH
10.0000 mL | INTRAVENOUS | Status: DC | PRN
  Administered 2017-02-13: 10 mL via INTRAVENOUS
  Filled 2017-02-13: qty 10

## 2017-02-13 MED ORDER — DEXTROSE 5 % IV SOLN
Freq: Once | INTRAVENOUS | Status: AC
  Administered 2017-02-13: 10:00:00 via INTRAVENOUS

## 2017-02-13 MED ORDER — CAPECITABINE 500 MG PO TABS: 2000.0000 mg | ORAL_TABLET | Freq: Two times a day (BID) | ORAL | 0 refills | Status: DC

## 2017-02-13 MED ORDER — PALONOSETRON HCL INJECTION 0.25 MG/5ML
INTRAVENOUS | Status: AC
  Filled 2017-02-13: qty 5

## 2017-02-13 MED ORDER — SODIUM CHLORIDE 0.9 % IV SOLN
Freq: Once | INTRAVENOUS | Status: AC
  Administered 2017-02-13: 10:00:00 via INTRAVENOUS
  Filled 2017-02-13: qty 5

## 2017-02-13 MED ORDER — SODIUM CHLORIDE 0.9% FLUSH
10.0000 mL | INTRAVENOUS | Status: DC | PRN
  Administered 2017-02-13: 10 mL
  Filled 2017-02-13: qty 10

## 2017-02-13 MED ORDER — ONDANSETRON HCL 8 MG PO TABS: 8.0000 mg | ORAL_TABLET | Freq: Three times a day (TID) | ORAL | 1 refills | Status: DC | PRN

## 2017-02-13 MED ORDER — HEPARIN SOD (PORK) LOCK FLUSH 100 UNIT/ML IV SOLN
500.0000 [IU] | Freq: Once | INTRAVENOUS | Status: AC | PRN
  Administered 2017-02-13: 500 [IU]
  Filled 2017-02-13: qty 5

## 2017-02-13 MED ORDER — OXALIPLATIN CHEMO INJECTION 100 MG/20ML
86.0000 mg/m2 | Freq: Once | INTRAVENOUS | Status: AC
  Administered 2017-02-13: 200 mg via INTRAVENOUS
  Filled 2017-02-13: qty 40

## 2017-02-13 MED ORDER — PALONOSETRON HCL INJECTION 0.25 MG/5ML
0.2500 mg | Freq: Once | INTRAVENOUS | Status: AC
  Administered 2017-02-13: 0.25 mg via INTRAVENOUS

## 2017-02-13 MED FILL — XELODA 500 MG TABLET: 500 | 14 days supply | Qty: 112 | Fill #0

## 2017-02-13 MED FILL — ONDANSETRON HCL 8 MG TABLET: 8 | 6 days supply | Qty: 20 | Fill #0

## 2017-02-13 NOTE — Patient Instructions (Signed)
[facility] Discharge Instructions for Patients Receiving Chemotherapy  Today you received the following chemotherapy agents OXALIPLATIN  To help prevent nausea and vomiting after your treatment, we encourage you to take your nausea medication as directed by your MD.  Take Compazine 10 mg every 6 hours as needed for nausea.  DO Not  Drive  After  Taking  Compazine  As this  Medication  Can  Cause  Drowsiness. DO  NOT  Eat  Greasy  Nor  Spicy  Foods.   DO  Drink  Lots  Of  Fluids  At  Room  Temperature  As  Tolerated. Watch  For  [employer].     If you develop nausea and vomiting that is not controlled by your nausea medication, call the clinic.   BELOW ARE SYMPTOMS THAT SHOULD BE REPORTED IMMEDIATELY:  *FEVER GREATER THAN 100.5 F  *CHILLS WITH OR WITHOUT FEVER  NAUSEA AND VOMITING THAT IS NOT CONTROLLED WITH YOUR NAUSEA MEDICATION  *UNUSUAL SHORTNESS OF BREATH  *UNUSUAL BRUISING OR BLEEDING  TENDERNESS IN MOUTH AND THROAT WITH OR WITHOUT PRESENCE OF ULCERS  *URINARY PROBLEMS  *BOWEL PROBLEMS  UNUSUAL RASH Items with * indicate a potential emergency and should be followed up as soon as possible.  Feel free to call the clinic you have any questions or concerns. The clinic phone number is [phone].  Please show the [facility] at check-in to the Emergency Department and triage nurse.  Oxaliplatin Injection What is this medicine? OXALIPLATIN (ox AL i PLA tin) is a chemotherapy drug. It targets fast dividing cells, like cancer cells, and causes these cells to die. This medicine is used to treat cancers of the colon and rectum, and many other cancers. This medicine may be used for other purposes; ask your health care provider or pharmacist if you have questions. COMMON BRAND NAME(S): Eloxatin What should I tell my health care provider before I take this medicine? They need to know if you have any of these conditions: -kidney disease -an unusual  or allergic reaction to oxaliplatin, other chemotherapy, other medicines, foods, dyes, or preservatives -pregnant or trying to get pregnant -breast-feeding How should I use this medicine? This drug is given as an infusion into a vein. It is administered in a hospital or clinic by a specially trained health care professional. Talk to your pediatrician regarding the use of this medicine in children. Special care may be needed. Overdosage: If you think you have taken too much of this medicine contact a poison control center or emergency room at once. NOTE: This medicine is only for you. Do not share this medicine with others. What if I miss a dose? It is important not to miss a dose. Call your doctor or health care professional if you are unable to keep an appointment. What may interact with this medicine? -medicines to increase blood counts like filgrastim, pegfilgrastim, sargramostim -probenecid -some antibiotics like amikacin, gentamicin, neomycin, polymyxin B, streptomycin, tobramycin -zalcitabine Talk to your doctor or health care professional before taking any of these medicines: -acetaminophen -aspirin -ibuprofen -ketoprofen -naproxen This list may not describe all possible interactions. Give your health care provider a list of all the medicines, herbs, non-prescription drugs, or dietary supplements you use. Also tell them if you smoke, drink alcohol, or use illegal drugs. Some items may interact with your medicine. What should I watch for while using this medicine? Your condition will be monitored carefully while you are receiving this medicine.  You will need important blood work done while you are taking this medicine. This medicine can make you more sensitive to cold. Do not drink cold drinks or use ice. Cover exposed skin before coming in contact with cold temperatures or cold objects. When out in cold weather wear warm clothing and cover your mouth and nose to warm the air that goes  into your lungs. Tell your doctor if you get sensitive to the cold. This drug may make you feel generally unwell. This is not uncommon, as chemotherapy can affect healthy cells as well as cancer cells. Report any side effects. Continue your course of treatment even though you feel ill unless your doctor tells you to stop. In some cases, you may be given additional medicines to help with side effects. Follow all directions for their use. Call your doctor or health care professional for advice if you get a fever, chills or sore throat, or other symptoms of a cold or flu. Do not treat yourself. This drug decreases your body's ability to fight infections. Try to avoid being around people who are sick. This medicine may increase your risk to bruise or bleed. Call your doctor or health care professional if you notice any unusual bleeding. Be careful brushing and flossing your teeth or using a toothpick because you may get an infection or bleed more easily. If you have any dental work done, tell your dentist you are receiving this medicine. Avoid taking products that contain aspirin, acetaminophen, ibuprofen, naproxen, or ketoprofen unless instructed by your doctor. These medicines may hide a fever. Do not become pregnant while taking this medicine. Women should inform their doctor if they wish to become pregnant or think they might be pregnant. There is a potential for serious side effects to an unborn child. Talk to your health care professional or pharmacist for more information. Do not breast-feed an infant while taking this medicine. Call your doctor or health care professional if you get diarrhea. Do not treat yourself. What side effects may I notice from receiving this medicine? Side effects that you should report to your doctor or health care professional as soon as possible: -allergic reactions like skin rash, itching or hives, swelling of the face, lips, or tongue -low blood counts - This drug may  decrease the number of white blood cells, red blood cells and platelets. You may be at increased risk for infections and bleeding. -signs of infection - fever or chills, cough, sore throat, pain or difficulty passing urine -signs of decreased platelets or bleeding - bruising, pinpoint red spots on the skin, black, tarry stools, nosebleeds -signs of decreased red blood cells - unusually weak or tired, fainting spells, lightheadedness -breathing problems -chest pain, pressure -cough -diarrhea -jaw tightness -mouth sores -nausea and vomiting -pain, swelling, redness or irritation at the injection site -pain, tingling, numbness in the hands or feet -problems with balance, talking, walking -redness, blistering, peeling or loosening of the skin, including inside the mouth -trouble passing urine or change in the amount of urine Side effects that usually do not require medical attention (report to your doctor or health care professional if they continue or are bothersome): -changes in vision -constipation -hair loss -loss of appetite -metallic taste in the mouth or changes in taste -stomach pain This list may not describe all possible side effects. Call your doctor for medical advice about side effects. You may report side effects to FDA at 1-800-FDA-1088. Where should I keep my medicine? This drug is given in  a hospital or clinic and will not be stored at home. NOTE: This sheet is a summary. It may not cover all possible information. If you have questions about this medicine, talk to your doctor, pharmacist, or health care provider.  2017 Elsevier/Gold Standard (2008-05-25 17:22:47)

## 2017-02-13 NOTE — Progress Notes (Signed)
  [facility] OFFICE PROGRESS NOTE   Diagnosis: Colon cancer  INTERVAL HISTORY:   Jermaine Moody returns as scheduled. He completed another cycle of CAPOX beginning 01/23/2017. No nausea/vomiting, mouth sores, or diarrhea. He had cold sensitivity following chemotherapy. No neuropathy symptoms at present.  Objective:  Vital signs in last 24 hours:  Blood pressure (!) 154/98, pulse 85, temperature 98.5 F (36.9 C), temperature source Oral, resp. rate 18, height _0  (1.803 m), weight 226 lb 12.8 oz (102.9 kg), SpO2 96 %.    HEENT: No thrush or ulcers Resp: Lungs clear bilaterally Cardio: Regular rate and rhythm with premature beats GI: No hepatosplenomegaly, nontender Vascular: No leg edema Neuro: Mild loss of vibratory sense at the fingertips bilaterally  Skin: Hyperpigmentation of the hands and feet with dryness. Dry flaking at the soles. No erythema   Portacath/PICC-without erythema  Lab Results:  Lab Results  Component Value Date   WBC 6.0 02/13/2017   HGB 13.6 02/13/2017   HCT 40.6 02/13/2017   MCV 89.3 02/13/2017   PLT 83 (L) 02/13/2017   NEUTROABS 3.2 02/13/2017     Medications: I have reviewed the patient's current medications.  Assessment/Plan: 1. Well-differentiated adenocarcinoma of the descending colon, stage III (T3 N1), status post a left colectomy 11/14/2016 ? 1/20 lymph nodes positive ? MSI-stable, no loss of mismatch repair protein expression ? Cycle 1 CAPOX 12/12/2016 ? Cycle 2 CAPOX 01/02/2017 ? Cycle 3 CAPOX 01/23/2017 ? Cycle 4 CAPOX 02/13/2017  2. History of hepatitis C-treated with medical therapy  3. Hypertension  4. Nausea following cycle 1 CAPOX-emend added with cycle 2  5.   Thrombocytopenia-predating chemotherapy, stable    Disposition:  Jermaine Moody continues to tolerate the chemotherapy well. He will complete a final cycle of CAPOX beginning today. He has mild thrombocytopenia that predates chemotherapy. He was  treated with platelets at this level in the past and did not develop significant thrombocytopenia.  He will return for a CBC in 2 weeks and an office visit in 4 weeks.  We will discuss the timing of Port-A-Cath removal when he returns in 4 weeks.  Betsy Coder, MD  02/13/2017  9:30 AM

## 2017-02-13 NOTE — Progress Notes (Signed)
OK to treat today with platelet count of 83 per Dr. Benay Spice.

## 2017-02-19 ENCOUNTER — Telehealth: Payer: Self-pay | Admitting: Oncology

## 2017-02-19 NOTE — Telephone Encounter (Signed)
Lvm advising appts 4/19 @ 2.15 and 5/3 @ 3pm.

## 2017-02-28 ENCOUNTER — Other Ambulatory Visit: Payer: 59

## 2017-03-01 ENCOUNTER — Other Ambulatory Visit (HOSPITAL_BASED_OUTPATIENT_CLINIC_OR_DEPARTMENT_OTHER): Payer: 59

## 2017-03-01 ENCOUNTER — Telehealth: Payer: Self-pay | Admitting: *Deleted

## 2017-03-01 DIAGNOSIS — C186 Malignant neoplasm of descending colon: Secondary | ICD-10-CM | POA: Diagnosis not present

## 2017-03-01 LAB — CBC WITH DIFFERENTIAL/PLATELET
BASO%: 1.2 % (ref 0.0–2.0)
Basophils Absolute: 0.1 10*3/uL (ref 0.0–0.1)
EOS%: 1.9 % (ref 0.0–7.0)
Eosinophils Absolute: 0.1 10*3/uL (ref 0.0–0.5)
HEMATOCRIT: 41.6 % (ref 38.4–49.9)
HEMOGLOBIN: 14 g/dL (ref 13.0–17.1)
LYMPH%: 26.5 % (ref 14.0–49.0)
MCH: 30.3 pg (ref 27.2–33.4)
MCHC: 33.6 g/dL (ref 32.0–36.0)
MCV: 90.1 fL (ref 79.3–98.0)
MONO#: 1.2 10*3/uL — ABNORMAL HIGH (ref 0.1–0.9)
MONO%: 15.4 % — ABNORMAL HIGH (ref 0.0–14.0)
NEUT%: 55 % (ref 39.0–75.0)
NEUTROS ABS: 4.3 10*3/uL (ref 1.5–6.5)
Platelets: 63 10*3/uL — ABNORMAL LOW (ref 140–400)
RBC: 4.62 10*6/uL (ref 4.20–5.82)
RDW: 21.2 % — AB (ref 11.0–14.6)
WBC: 7.9 10*3/uL (ref 4.0–10.3)
lymph#: 2.1 10*3/uL (ref 0.9–3.3)

## 2017-03-01 NOTE — Telephone Encounter (Signed)
-----   Message from Ladell Pier, MD sent at 03/01/2017  4:41 PM EDT ----- Please call patient, platelets are slightly lower, call for bleeding,f/u as scheduled

## 2017-03-01 NOTE — Telephone Encounter (Signed)
Patient notified per order of Dr. Benay Spice that platelets are slightly lower, to call for any bleeding and to f/u as scheduled.  Patient appreciative of call and has no questions at this time.

## 2017-03-05 ENCOUNTER — Telehealth: Payer: Self-pay | Admitting: Oncology

## 2017-03-05 NOTE — Telephone Encounter (Signed)
Completed FMLA forms and faxed to [employer] on 02/06/17 fax [phone]

## 2017-03-06 DIAGNOSIS — I10 Essential (primary) hypertension: Secondary | ICD-10-CM | POA: Diagnosis not present

## 2017-03-14 ENCOUNTER — Ambulatory Visit (HOSPITAL_BASED_OUTPATIENT_CLINIC_OR_DEPARTMENT_OTHER): Payer: 59 | Admitting: Oncology

## 2017-03-14 ENCOUNTER — Other Ambulatory Visit (HOSPITAL_BASED_OUTPATIENT_CLINIC_OR_DEPARTMENT_OTHER): Payer: 59

## 2017-03-14 VITALS — BP 153/82 | HR 85 | Temp 98.7°F | Resp 21 | Ht 71.0 in | Wt 223.7 lb

## 2017-03-14 DIAGNOSIS — I10 Essential (primary) hypertension: Secondary | ICD-10-CM | POA: Diagnosis not present

## 2017-03-14 DIAGNOSIS — D696 Thrombocytopenia, unspecified: Secondary | ICD-10-CM | POA: Diagnosis not present

## 2017-03-14 DIAGNOSIS — C186 Malignant neoplasm of descending colon: Secondary | ICD-10-CM

## 2017-03-14 LAB — CBC WITH DIFFERENTIAL/PLATELET
BASO%: 0.8 % (ref 0.0–2.0)
Basophils Absolute: 0.1 10*3/uL (ref 0.0–0.1)
EOS%: 0.9 % (ref 0.0–7.0)
Eosinophils Absolute: 0.1 10*3/uL (ref 0.0–0.5)
HEMATOCRIT: 44.1 % (ref 38.4–49.9)
HGB: 14.6 g/dL (ref 13.0–17.1)
LYMPH#: 2.3 10*3/uL (ref 0.9–3.3)
LYMPH%: 29.6 % (ref 14.0–49.0)
MCH: 30.3 pg (ref 27.2–33.4)
MCHC: 33.1 g/dL (ref 32.0–36.0)
MCV: 91.5 fL (ref 79.3–98.0)
MONO#: 1.1 10*3/uL — AB (ref 0.1–0.9)
MONO%: 13.7 % (ref 0.0–14.0)
NEUT%: 55 % (ref 39.0–75.0)
NEUTROS ABS: 4.2 10*3/uL (ref 1.5–6.5)
PLATELETS: 96 10*3/uL — AB (ref 140–400)
RBC: 4.82 10*6/uL (ref 4.20–5.82)
RDW: 18.9 % — ABNORMAL HIGH (ref 11.0–14.6)
WBC: 7.7 10*3/uL (ref 4.0–10.3)

## 2017-03-14 NOTE — Progress Notes (Signed)
  [facility] OFFICE PROGRESS NOTE   Diagnosis: Colon cancer  INTERVAL HISTORY:   Jermaine Moody completed a final cycle of CAPOX beginning 02/13/2017. He denies neuropathy symptoms. He feels well. He has dryness of the hands and feet.  Objective:  Vital signs in last 24 hours:  Blood pressure (!) 153/82, pulse 85, temperature 98.7 F (37.1 C), temperature source Oral, resp. rate (!) 21, height '5\' 11"'$  (1.803 m), weight 223 lb 11.2 oz (101.5 kg), SpO2 100 %.    HEENT: No thrush or ulcers Lymphatics: No cervical, supraclavicular, axillary, or inguinal nodes Resp: Lungs clear bilaterally Cardio: Regular rate and rhythm GI: No hepatosplenomegaly, nontender, no mass Vascular: No leg edema  Skin: Hyperpigmentation and dryness of the hands and feet. There are areas of dry flaking. No erythema or skin breakdown.   Portacath/PICC-without erythema  Lab Results:  Lab Results  Component Value Date   WBC 7.7 03/14/2017   HGB 14.6 03/14/2017   HCT 44.1 03/14/2017   MCV 91.5 03/14/2017   PLT 96 (L) 03/14/2017   NEUTROABS 4.2 03/14/2017    CMP     Component Value Date/Time   NA 142 02/13/2017 0820   K 3.4 (L) 02/13/2017 0820   CL 104 12/04/2016 0648   CO2 23 02/13/2017 0820   GLUCOSE 132 02/13/2017 0820   GLUCOSE 105 (H) 10/10/2006 0833   BUN 11.4 02/13/2017 0820   CREATININE 0.8 02/13/2017 0820   CALCIUM 9.3 02/13/2017 0820   PROT 7.2 02/13/2017 0820   ALBUMIN 3.6 02/13/2017 0820   AST 21 02/13/2017 0820   ALT 16 02/13/2017 0820   ALKPHOS 60 02/13/2017 0820   BILITOT 0.56 02/13/2017 0820   GFRNONAA >60 12/04/2016 0648   GFRAA >60 12/04/2016 0648     Medications: I have reviewed the patient's current medications.  Assessment/Plan: 1. Well-differentiated adenocarcinoma of the descending colon, stage III (T3 N1), status post a left colectomy 11/14/2016 ? 1/20 lymph nodes positive ? MSI-stable, no loss of mismatch repair protein expression ? Cycle 1 CAPOX  12/12/2016 ? Cycle 2 CAPOX 01/02/2017 ? Cycle 3 CAPOX 01/23/2017 ? Cycle 4 CAPOX 02/13/2017  2. History of hepatitis C-treated with medical therapy  3. Hypertension  4. Nausea following cycle 1 CAPOX-emend added with cycle 2  5.   Thrombocytopenia-predating chemotherapy, stable    Disposition:  Jermaine Moody has completed the planned course of adjuvant chemotherapy. We will refer him to Dr. Johney Maine for removal of the Port-A-Cath. He will return for a CEA in 2 months and an office visit in 4 months. We will plan for surveillance CT scans in December or January.  15 minutes were spent with the patient today. The majority of the time was used for counseling and coordination of care.  Betsy Coder, MD  03/14/2017  3:28 PM

## 2017-03-19 ENCOUNTER — Telehealth: Payer: Self-pay | Admitting: Oncology

## 2017-03-19 NOTE — Telephone Encounter (Signed)
Referral for consult/surgery faxed to Dr Michael Boston @ [facility].  Appointments scheduled per 03/14/17 los. Appointment letter and schedule mailed to patient.

## 2017-04-09 ENCOUNTER — Telehealth: Payer: Self-pay | Admitting: *Deleted

## 2017-04-09 NOTE — Telephone Encounter (Signed)
Call placed to patient to see if appt has been made for port removal.  Per pt, port removal is scheduled for 04/30/17.  Patient appreciative of call.

## 2017-04-30 DIAGNOSIS — Z08 Encounter for follow-up examination after completed treatment for malignant neoplasm: Secondary | ICD-10-CM | POA: Diagnosis not present

## 2017-04-30 DIAGNOSIS — Z85048 Personal history of other malignant neoplasm of rectum, rectosigmoid junction, and anus: Secondary | ICD-10-CM | POA: Diagnosis not present

## 2017-04-30 DIAGNOSIS — Z85038 Personal history of other malignant neoplasm of large intestine: Secondary | ICD-10-CM | POA: Diagnosis not present

## 2017-04-30 DIAGNOSIS — Z452 Encounter for adjustment and management of vascular access device: Secondary | ICD-10-CM | POA: Diagnosis not present

## 2017-05-20 ENCOUNTER — Other Ambulatory Visit: Payer: 59

## 2017-05-21 ENCOUNTER — Other Ambulatory Visit (HOSPITAL_BASED_OUTPATIENT_CLINIC_OR_DEPARTMENT_OTHER): Payer: 59

## 2017-05-21 DIAGNOSIS — C186 Malignant neoplasm of descending colon: Secondary | ICD-10-CM | POA: Diagnosis not present

## 2017-05-21 LAB — CBC WITH DIFFERENTIAL/PLATELET
BASO%: 0.7 % (ref 0.0–2.0)
Basophils Absolute: 0.1 10*3/uL (ref 0.0–0.1)
EOS%: 1.7 % (ref 0.0–7.0)
Eosinophils Absolute: 0.1 10*3/uL (ref 0.0–0.5)
HCT: 46.8 % (ref 38.4–49.9)
HGB: 15.6 g/dL (ref 13.0–17.1)
LYMPH%: 26.5 % (ref 14.0–49.0)
MCH: 29.4 pg (ref 27.2–33.4)
MCHC: 33.3 g/dL (ref 32.0–36.0)
MCV: 88.2 fL (ref 79.3–98.0)
MONO#: 0.8 10*3/uL (ref 0.1–0.9)
MONO%: 10 % (ref 0.0–14.0)
NEUT%: 61.1 % (ref 39.0–75.0)
NEUTROS ABS: 5 10*3/uL (ref 1.5–6.5)
PLATELETS: 84 10*3/uL — AB (ref 140–400)
RBC: 5.31 10*6/uL (ref 4.20–5.82)
RDW: 13.3 % (ref 11.0–14.6)
WBC: 8.1 10*3/uL (ref 4.0–10.3)
lymph#: 2.1 10*3/uL (ref 0.9–3.3)

## 2017-05-21 LAB — BASIC METABOLIC PANEL
ANION GAP: 10 meq/L (ref 3–11)
BUN: 18 mg/dL (ref 7.0–26.0)
CO2: 23 mEq/L (ref 22–29)
CREATININE: 1 mg/dL (ref 0.7–1.3)
Calcium: 9.7 mg/dL (ref 8.4–10.4)
Chloride: 108 mEq/L (ref 98–109)
EGFR: >90 mL/min/{1.73_m2} (ref >90)
GLUCOSE: 100 mg/dL (ref 70–140)
Potassium: 3.9 mEq/L (ref 3.5–5.1)
Sodium: 141 mEq/L (ref 136–145)

## 2017-05-21 LAB — CEA (IN HOUSE-CHCC): CEA (CHCC-IN HOUSE): 4.09 ng/mL (ref 0.00–5.00)

## 2017-07-17 ENCOUNTER — Encounter: Payer: Self-pay | Admitting: Gastroenterology

## 2017-07-21 IMAGING — CR DG CHEST 1V PORT
1 series · 1 of 1 positions shown · non-contrast
Comparison: None.

CLINICAL DATA: Post right central line insertion

EXAM:
PORTABLE CHEST 1 VIEW

[AP]
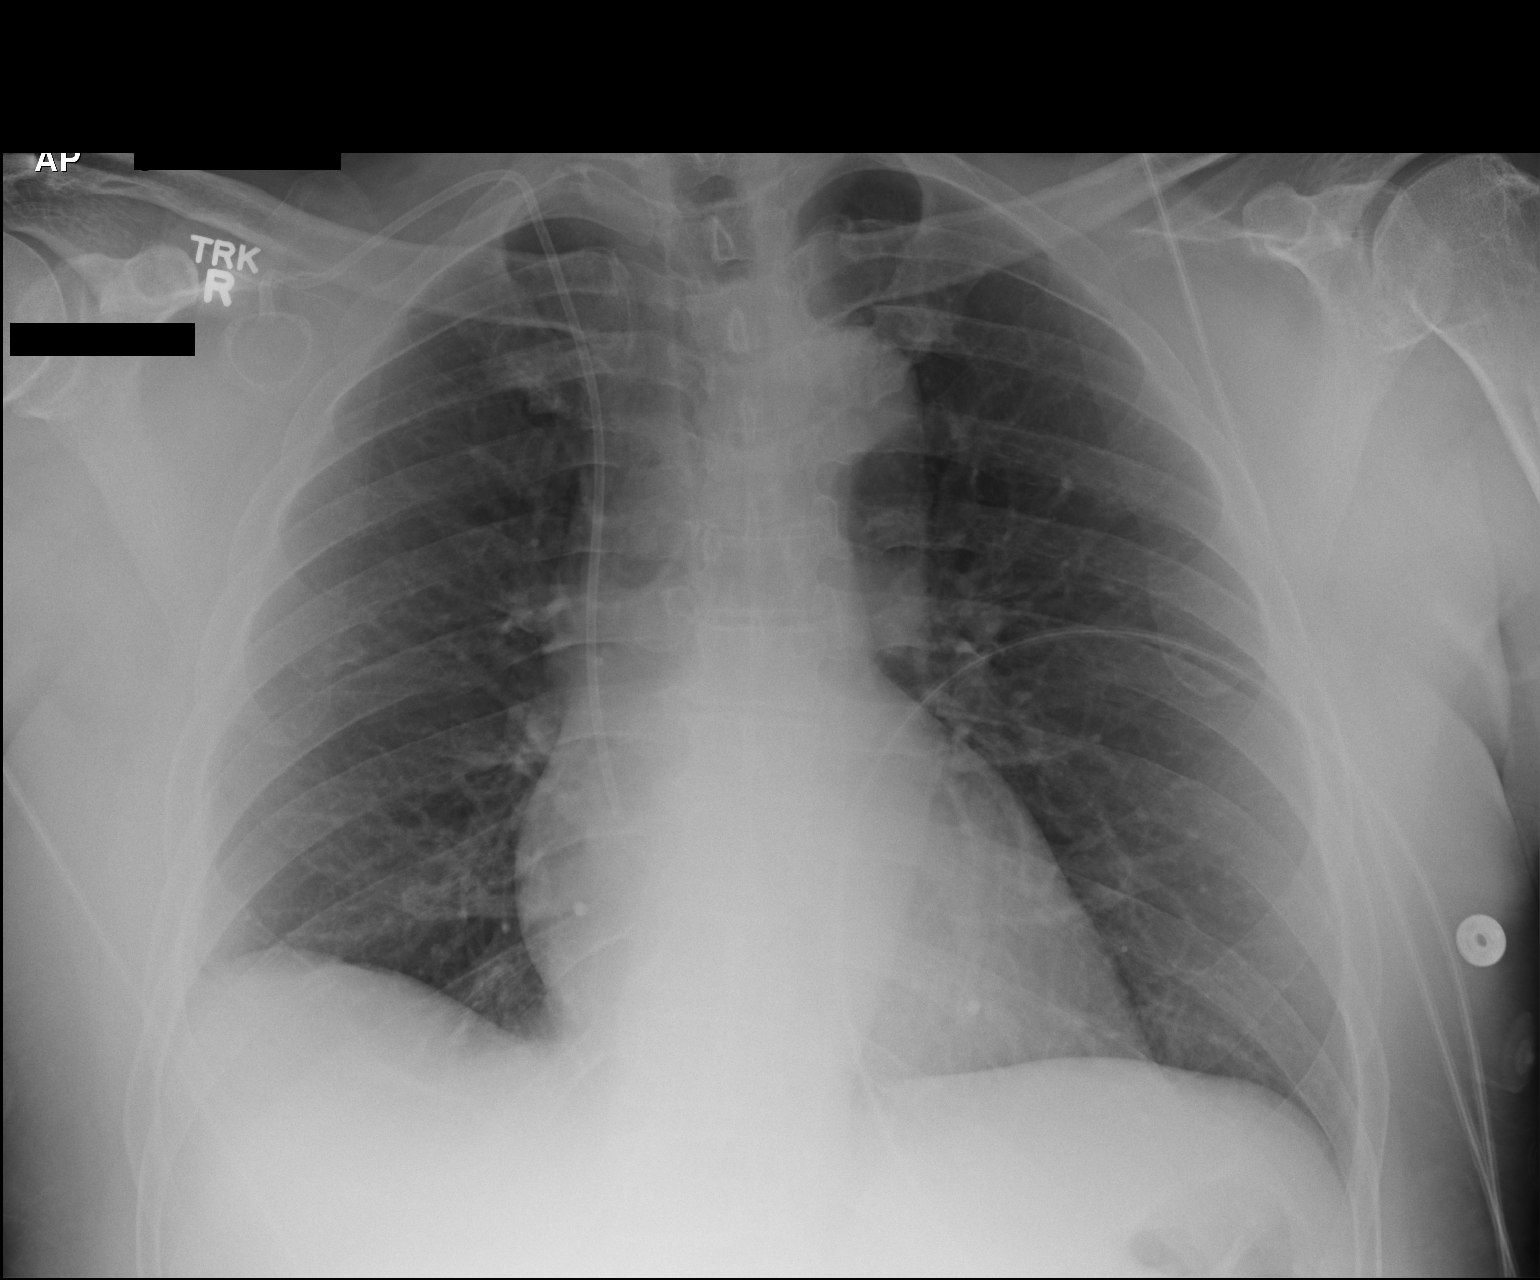

[1 of 1 positions shown; findings below may reference images not displayed]

FINDINGS: Right central line tip is in the upper right atrium. Slight kinking
of the catheter below the right clavicle. No pneumothorax. Heart is
normal size. Lungs are clear. No effusions.
IMPRESSION: Right Port-A-Cath placement with the tip in the upper right atrium.
Slight kinking of the catheter near the hub inferior to the right
clavicle.

No pneumothorax.  No acute cardiopulmonary disease.

## 2017-07-22 ENCOUNTER — Ambulatory Visit (HOSPITAL_BASED_OUTPATIENT_CLINIC_OR_DEPARTMENT_OTHER): Payer: 59 | Admitting: Nurse Practitioner

## 2017-07-22 ENCOUNTER — Other Ambulatory Visit (HOSPITAL_BASED_OUTPATIENT_CLINIC_OR_DEPARTMENT_OTHER): Payer: 59

## 2017-07-22 ENCOUNTER — Telehealth: Payer: Self-pay

## 2017-07-22 VITALS — BP 150/99 | HR 71 | Temp 98.6°F | Resp 18 | Ht 71.0 in | Wt 233.1 lb

## 2017-07-22 DIAGNOSIS — Z85038 Personal history of other malignant neoplasm of large intestine: Secondary | ICD-10-CM | POA: Diagnosis not present

## 2017-07-22 DIAGNOSIS — C186 Malignant neoplasm of descending colon: Secondary | ICD-10-CM

## 2017-07-22 DIAGNOSIS — D696 Thrombocytopenia, unspecified: Secondary | ICD-10-CM

## 2017-07-22 DIAGNOSIS — I10 Essential (primary) hypertension: Secondary | ICD-10-CM | POA: Diagnosis not present

## 2017-07-22 LAB — CBC WITH DIFFERENTIAL/PLATELET
BASO%: 1.3 % (ref 0.0–2.0)
Basophils Absolute: 0.1 10*3/uL (ref 0.0–0.1)
EOS%: 1.6 % (ref 0.0–7.0)
Eosinophils Absolute: 0.1 10*3/uL (ref 0.0–0.5)
HEMATOCRIT: 46.8 % (ref 38.4–49.9)
HGB: 15.5 g/dL (ref 13.0–17.1)
LYMPH#: 1.8 10*3/uL (ref 0.9–3.3)
LYMPH%: 26.3 % (ref 14.0–49.0)
MCH: 28.3 pg (ref 27.2–33.4)
MCHC: 33.1 g/dL (ref 32.0–36.0)
MCV: 85.4 fL (ref 79.3–98.0)
MONO#: 0.8 10*3/uL (ref 0.1–0.9)
MONO%: 11.1 % (ref 0.0–14.0)
NEUT%: 59.7 % (ref 39.0–75.0)
NEUTROS ABS: 4.1 10*3/uL (ref 1.5–6.5)
PLATELETS: 96 10*3/uL — AB (ref 140–400)
RBC: 5.48 10*6/uL (ref 4.20–5.82)
RDW: 14.1 % (ref 11.0–14.6)
WBC: 6.8 10*3/uL (ref 4.0–10.3)

## 2017-07-22 LAB — CEA (IN HOUSE-CHCC): CEA (CHCC-IN HOUSE): 3.56 ng/mL (ref 0.00–5.00)

## 2017-07-22 NOTE — Progress Notes (Signed)
  [facility] OFFICE PROGRESS NOTE   Diagnosis:  Colon cancer  INTERVAL HISTORY:   Jermaine Moody returns as scheduled. He feels well. No change in bowel habits. No bloody or black stools. No abdominal pain. No nausea. No neuropathy symptoms. He has a good appetite. He is gaining weight.  Objective:  Vital signs in last 24 hours:  Blood pressure (!) 150/99, pulse 71, temperature 98.6 F (37 C), temperature source Oral, resp. rate 18, height 5' 11" (1.803 m), weight 233 lb 1.6 oz (105.7 kg), SpO2 100 %.    HEENT: Neck without mass. Lymphatics: No palpable cervical, supraclavicular, axillary or inguinal lymph nodes. Resp: Lungs clear bilaterally. Cardio: Regular rate and rhythm. GI: Abdomen soft and nontender. No hepatomegaly. No mass. Vascular: Trace lower leg edema bilaterally.     Lab Results:  Lab Results  Component Value Date   WBC 6.8 07/22/2017   HGB 15.5 07/22/2017   HCT 46.8 07/22/2017   MCV 85.4 07/22/2017   PLT 96 (L) 07/22/2017   NEUTROABS 4.1 07/22/2017    Imaging:  No results found.  Medications: I have reviewed the patient's current medications.  Assessment/Plan: 1. Well-differentiated adenocarcinoma of the descending colon, stage III (T3 N1), status post a left colectomy 11/14/2016 ? 1/20 lymph nodes positive ? MSI-stable, no loss of mismatch repair protein expression ? Cycle 1 CAPOX 12/12/2016 ? Cycle 2 CAPOX 01/02/2017 ? Cycle 3 CAPOX 01/23/2017 ? Cycle 4 CAPOX 02/13/2017  2. History of hepatitis C-treated with medical therapy  3. Hypertension  4. Nausea following cycle 1 CAPOX-emend added with cycle 2  5. Thrombocytopenia-predating chemotherapy, stable   Disposition:Jermaine Moody remains in clinical remission from colon cancer. We will follow-up on the CEA from today. He is scheduled for a colonoscopy in November. We are referring him for surveillance CT scans in January 2019. We will see him back in 4 months, a few days  after the CT scans. He will contact the office in the interim with any problems.  Plan reviewed with Dr. Benay Spice.    Ned Card ANP/GNP-BC   07/22/2017  11:20 AM

## 2017-07-22 NOTE — Telephone Encounter (Signed)
Gave patient avs and calender for January per los.

## 2017-08-20 DIAGNOSIS — I10 Essential (primary) hypertension: Secondary | ICD-10-CM | POA: Diagnosis not present

## 2017-08-23 MED FILL — BENAZEPRIL HCL 20 MG TABLET: 20 | 90 days supply | Qty: 90 | Fill #0

## 2017-08-23 MED FILL — HYDROCHLOROTHIAZIDE 12.5 MG: 12.5 | 90 days supply | Qty: 90 | Fill #0

## 2017-09-06 ENCOUNTER — Encounter: Payer: Self-pay | Admitting: Gastroenterology

## 2017-09-06 ENCOUNTER — Ambulatory Visit (AMBULATORY_SURGERY_CENTER): Payer: Self-pay | Admitting: *Deleted

## 2017-09-06 VITALS — Ht 71.0 in | Wt 229.0 lb

## 2017-09-06 DIAGNOSIS — Z85038 Personal history of other malignant neoplasm of large intestine: Secondary | ICD-10-CM

## 2017-09-06 MED ORDER — NA SULFATE-K SULFATE-MG SULF 17.5-3.13-1.6 GM/177ML PO SOLN: 1.0000 | Freq: Once | ORAL | 0 refills | Status: AC

## 2017-09-06 NOTE — Progress Notes (Signed)
No egg or soy allergy known to patient  No issues with past sedation with any surgeries  or procedures, no intubation problems  No diet pills per patient No home 02 use per patient  No blood thinners per patient  Pt denies issues with constipation  No A fib or A flutter  EMMI video sent to pt's e mail -  Pt has [employer] insurance- prep will be 75$- offered pay no more than $50 coupon and pt states that he cannot truly afford 50 at this time  gave sample suprep   Medication Samples have been provided to the patient.  Drug name: suprep             Qty: 1 kit  LOT: [id number]  Exp.Date: 6-20  Dosing instructions: as directed-- called [employer] and cancelled prep e scribed    The patient has been instructed regarding the correct time, dose, and frequency of taking this medication, including desired effects and most common side effects.   Hetty Ely Widener 10:39 AM 09/06/2017

## 2017-09-09 DIAGNOSIS — I10 Essential (primary) hypertension: Secondary | ICD-10-CM | POA: Diagnosis not present

## 2017-09-10 MED FILL — AMLODIPINE-BENAZEPRIL 5-40: 5-40 | 30 days supply | Qty: 30 | Fill #0

## 2017-09-20 ENCOUNTER — Ambulatory Visit (AMBULATORY_SURGERY_CENTER): Payer: 59 | Admitting: Gastroenterology

## 2017-09-20 ENCOUNTER — Other Ambulatory Visit: Payer: Self-pay

## 2017-09-20 ENCOUNTER — Encounter: Payer: Self-pay | Admitting: Gastroenterology

## 2017-09-20 VITALS — BP 109/85 | HR 78 | Temp 98.7°F | Resp 9 | Ht 71.0 in | Wt 229.0 lb

## 2017-09-20 DIAGNOSIS — D120 Benign neoplasm of cecum: Secondary | ICD-10-CM

## 2017-09-20 DIAGNOSIS — D122 Benign neoplasm of ascending colon: Secondary | ICD-10-CM

## 2017-09-20 DIAGNOSIS — D123 Benign neoplasm of transverse colon: Secondary | ICD-10-CM

## 2017-09-20 DIAGNOSIS — K635 Polyp of colon: Secondary | ICD-10-CM

## 2017-09-20 DIAGNOSIS — D126 Benign neoplasm of colon, unspecified: Secondary | ICD-10-CM

## 2017-09-20 DIAGNOSIS — Z85038 Personal history of other malignant neoplasm of large intestine: Secondary | ICD-10-CM | POA: Diagnosis present

## 2017-09-20 MED ORDER — SODIUM CHLORIDE 0.9 % IV SOLN: 500.0000 mL | INTRAVENOUS | Status: DC

## 2017-09-20 NOTE — Progress Notes (Signed)
Called to room to assist during endoscopic procedure.  Patient ID and intended procedure confirmed with present staff. Received instructions for my participation in the procedure from the performing physician.  

## 2017-09-20 NOTE — Op Note (Signed)
[facility] Patient Name: Jermaine Moody Procedure Date: 09/20/2017 9:16 AM MRN: [id number] Endoscopist: Remo Lipps P. Gordon Vandunk MD, MD Age: 66 Referring MD:  Date of Birth: January 10, 1951 Gender: Male Account #: [id number] Procedure:                Colonoscopy Indications:              High risk colon cancer surveillance: Personal                            history of colon cancer one year ago s/p resection                            and chemotherapy Medicines:                Monitored Anesthesia Care Procedure:                Pre-Anesthesia Assessment:                           - Prior to the procedure, a History and Physical                            was performed, and patient medications and                            allergies were reviewed. The patient's tolerance of                            previous anesthesia was also reviewed. The risks                            and benefits of the procedure and the sedation                            options and risks were discussed with the patient.                            All questions were answered, and informed consent                            was obtained. Prior Anticoagulants: The patient has                            taken no previous anticoagulant or antiplatelet                            agents. ASA Grade Assessment: II - A patient with                            mild systemic disease. After reviewing the risks                            and benefits, the patient was deemed in  satisfactory condition to undergo the procedure.                           After obtaining informed consent, the colonoscope                            was passed under direct vision. Throughout the                            procedure, the patient's blood pressure, pulse, and                            oxygen saturations were monitored continuously. The                            Colonoscope was introduced through the  anus and                            advanced to the the cecum, identified by                            appendiceal orifice and ileocecal valve. The                            colonoscopy was performed without difficulty. The                            patient tolerated the procedure well. The quality                            of the bowel preparation was adequate. The                            ileocecal valve, appendiceal orifice, and rectum                            were photographed. Scope In: 9:24:14 AM Scope Out: 9:42:54 AM Scope Withdrawal Time: 0 hours 17 minutes 10 seconds  Total Procedure Duration: 0 hours 18 minutes 40 seconds  Findings:                 The perianal and digital rectal examinations were                            normal.                           There was evidence of a prior end-to-end                            colo-colonic anastomosis in the sigmoid colon. This                            was patent and was characterized by healthy  appearing mucosa.                           A 3 mm polyp was found in the cecum. The polyp was                            flat. The polyp was removed with a cold snare.                            Resection and retrieval were complete.                           A few medium-sized angiodysplastic lesions were                            found in the transverse colon, in the ascending                            colon and in the cecum.                           A diminutive polyp was found in the ascending                            colon. The polyp was sessile. The polyp was removed                            with a cold snare. Resection and retrieval were                            complete.                           A 4 mm polyp was found in the transverse colon. The                            polyp was sessile. The polyp was removed with a                            cold snare. Resection and retrieval  were complete.                           Internal hemorrhoids were found during                            retroflexion. The hemorrhoids were small.                           The exam was otherwise without abnormality. Complications:            No immediate complications. Estimated blood loss:                            Minimal. Estimated Blood Loss:     Estimated blood loss was  minimal. Impression:               - Patent end-to-end colo-colonic anastomosis,                            characterized by healthy appearing mucosa.                           - One 3 mm polyp in the cecum, removed with a cold                            snare. Resected and retrieved.                           - A few colonic angiodysplastic lesions.                           - One diminutive polyp in the ascending colon,                            removed with a cold snare. Resected and retrieved.                           - One 4 mm polyp in the transverse colon, removed                            with a cold snare. Resected and retrieved.                           - Internal hemorrhoids.                           - The examination was otherwise normal. Recommendation:           - Patient has a contact number available for                            emergencies. The signs and symptoms of potential                            delayed complications were discussed with the                            patient. Return to normal activities tomorrow.                            Written discharge instructions were provided to the                            patient.                           - Resume previous diet.                           - Continue present medications.                           -  Await pathology results.                           - Repeat colonoscopy is recommended for                            surveillance in 3 years Carlota Raspberry. Arsenio Schnorr MD, MD 09/20/2017 9:49:52 AM This report has been signed  electronically.

## 2017-09-20 NOTE — Progress Notes (Signed)
Pt's states no medical or surgical changes since previsit or office visit. 

## 2017-09-20 NOTE — Patient Instructions (Signed)
YOU HAD AN ENDOSCOPIC PROCEDURE TODAY AT THE [facility]:   Refer to the procedure report that was given to you for any specific questions about what was found during the examination.  If the procedure report does not answer your questions, please call your gastroenterologist to clarify.  If you requested that your care partner not be given the details of your procedure findings, then the procedure report has been included in a sealed envelope for you to review at your convenience later.  YOU SHOULD EXPECT: Some feelings of bloating in the abdomen. Passage of more gas than usual.  Walking can help get rid of the air that was put into your GI tract during the procedure and reduce the bloating. If you had a lower endoscopy (such as a colonoscopy or flexible sigmoidoscopy) you may notice spotting of blood in your stool or on the toilet paper. If you underwent a bowel prep for your procedure, you may not have a normal bowel movement for a few days.  Please Note:  You might notice some irritation and congestion in your nose or some drainage.  This is from the oxygen used during your procedure.  There is no need for concern and it should clear up in a day or so.  SYMPTOMS TO REPORT IMMEDIATELY:   Following lower endoscopy (colonoscopy or flexible sigmoidoscopy):  Excessive amounts of blood in the stool  Significant tenderness or worsening of abdominal pains  Swelling of the abdomen that is new, acute  Fever of 100F or higher    For urgent or emergent issues, a gastroenterologist can be reached at any hour by calling [phone].   DIET:  We do recommend a small meal at first, but then you may proceed to your regular diet.  Drink plenty of fluids but you should avoid alcoholic beverages for 24 hours.  ACTIVITY:  You should plan to take it easy for the rest of today and you should NOT DRIVE or use heavy machinery until tomorrow (because of the sedation medicines used during the test).     FOLLOW UP: Our staff will call the number listed on your records the next business day following your procedure to check on you and address any questions or concerns that you may have regarding the information given to you following your procedure. If we do not reach you, we will leave a message.  However, if you are feeling well and you are not experiencing any problems, there is no need to return our call.  We will assume that you have returned to your regular daily activities without incident.  If any biopsies were taken you will be contacted by phone or by letter within the next 1-3 weeks.  Please call us at [phone] if you have not heard about the biopsies in 3 weeks.    SIGNATURES/CONFIDENTIALITY: You and/or your care partner have signed paperwork which will be entered into your electronic medical record.  These signatures attest to the fact that that the information above on your After Visit Summary has been reviewed and is understood.  Full responsibility of the confidentiality of this discharge information lies with you and/or your care-partner  Polyp and hemorrhoid information given.. 

## 2017-09-23 ENCOUNTER — Telehealth: Payer: Self-pay | Admitting: *Deleted

## 2017-09-23 NOTE — Telephone Encounter (Signed)
  Follow up Call-  Call back number 09/20/2017 09/10/2016  Post procedure Call Back phone  # [phone] work  [phone]  Permission to leave phone message Yes Yes  Some recent data might be hidden     Patient questions:  Do you have a fever, pain , or abdominal swelling? No. Pain Score  0 *  Have you tolerated food without any problems? Yes.    Have you been able to return to your normal activities? Yes.    Do you have any questions about your discharge instructions: Diet   No. Medications  No. Follow up visit  No.  Do you have questions or concerns about your Care? No.  Actions: * If pain score is 4 or above: No action needed, pain <4.

## 2017-09-25 ENCOUNTER — Encounter: Payer: Self-pay | Admitting: Gastroenterology

## 2017-11-15 ENCOUNTER — Telehealth: Payer: Self-pay | Admitting: Gastroenterology

## 2017-11-15 NOTE — Telephone Encounter (Signed)
Patient states he picked up contrast here at our office but did not get prep instructions. CT abd/pelvis was added on by Ned Card, NP at cancer center. I have printed up prep instructions and he will come by our office to pick up. I have advised patient if he has further questions to please contact their office.

## 2017-11-19 DIAGNOSIS — E669 Obesity, unspecified: Secondary | ICD-10-CM | POA: Diagnosis not present

## 2017-11-19 DIAGNOSIS — I10 Essential (primary) hypertension: Secondary | ICD-10-CM | POA: Diagnosis not present

## 2017-11-19 MED FILL — AMLODIPINE-BENAZEPRIL 5-40: 5-40 | 30 days supply | Qty: 30 | Fill #1

## 2017-11-21 ENCOUNTER — Ambulatory Visit (HOSPITAL_COMMUNITY)
Admission: RE | Admit: 2017-11-21 | Discharge: 2017-11-21 | Disposition: A | Discharge status: Home / Self Care | Payer: 59 | Source: Ambulatory Visit | Attending: Nurse Practitioner | Admitting: Nurse Practitioner

## 2017-11-21 ENCOUNTER — Inpatient Hospital Stay: Payer: 59 | Attending: Oncology

## 2017-11-21 DIAGNOSIS — I10 Essential (primary) hypertension: Secondary | ICD-10-CM | POA: Insufficient documentation

## 2017-11-21 DIAGNOSIS — C186 Malignant neoplasm of descending colon: Secondary | ICD-10-CM | POA: Insufficient documentation

## 2017-11-21 DIAGNOSIS — Z85038 Personal history of other malignant neoplasm of large intestine: Secondary | ICD-10-CM | POA: Diagnosis not present

## 2017-11-21 DIAGNOSIS — D696 Thrombocytopenia, unspecified: Secondary | ICD-10-CM | POA: Insufficient documentation

## 2017-11-21 DIAGNOSIS — C189 Malignant neoplasm of colon, unspecified: Secondary | ICD-10-CM | POA: Diagnosis not present

## 2017-11-21 LAB — COMPREHENSIVE METABOLIC PANEL
ALT: 17 U/L (ref 0–55)
AST: 18 U/L (ref 5–34)
Albumin: 4 g/dL (ref 3.5–5.0)
Alkaline Phosphatase: 78 U/L (ref 40–150)
Anion gap: 8 (ref 3–11)
BUN: 19 mg/dL (ref 7–26)
CHLORIDE: 106 mmol/L (ref 98–109)
CO2: 26 mmol/L (ref 22–29)
CREATININE: 1.22 mg/dL (ref 0.70–1.30)
Calcium: 9.2 mg/dL (ref 8.4–10.4)
GFR calc Af Amer: >60 mL/min (ref >60)
Glucose, Bld: 88 mg/dL (ref 70–140)
POTASSIUM: 4.1 mmol/L (ref 3.5–5.1)
Sodium: 140 mmol/L (ref 136–145)
Total Bilirubin: 0.4 mg/dL (ref 0.2–1.2)
Total Protein: 8.4 g/dL — ABNORMAL HIGH (ref 6.4–8.3)

## 2017-11-21 LAB — CBC WITH DIFFERENTIAL/PLATELET
Basophils Absolute: 0 10*3/uL (ref 0.0–0.1)
Basophils Relative: 0 %
EOS PCT: 2 %
Eosinophils Absolute: 0.1 10*3/uL (ref 0.0–0.5)
HCT: 48 % (ref 38.4–49.9)
Hemoglobin: 16.3 g/dL (ref 13.0–17.1)
LYMPHS ABS: 1.9 10*3/uL (ref 0.9–3.3)
LYMPHS PCT: 25 %
MCH: 29.1 pg (ref 27.2–33.4)
MCHC: 34 g/dL (ref 32.0–36.0)
MCV: 85.6 fL (ref 79.3–98.0)
MONO ABS: 0.6 10*3/uL (ref 0.1–0.9)
Monocytes Relative: 9 %
Neutro Abs: 4.7 10*3/uL (ref 1.5–6.5)
Neutrophils Relative %: 64 %
PLATELETS: 94 10*3/uL — AB (ref 140–400)
RBC: 5.61 MIL/uL (ref 4.20–5.82)
RDW: 14.5 % (ref 11.0–15.6)
WBC: 7.3 10*3/uL (ref 4.0–10.3)

## 2017-11-21 LAB — CEA (IN HOUSE-CHCC): CEA (CHCC-IN HOUSE): 3.44 ng/mL (ref 0.00–5.00)

## 2017-11-21 MED ORDER — IOPAMIDOL (ISOVUE-300) INJECTION 61%
INTRAVENOUS | Status: AC
  Administered 2017-11-21: 100 mL via INTRAVENOUS
  Filled 2017-11-21: qty 100

## 2017-11-22 ENCOUNTER — Telehealth: Payer: Self-pay | Admitting: *Deleted

## 2017-11-22 NOTE — Telephone Encounter (Signed)
-----   Message from Ladell Pier, MD sent at 11/22/2017  4:27 PM EST ----- Please call patient, CTs are negative for cancer, follow-up as scheduled

## 2017-11-22 NOTE — Telephone Encounter (Signed)
Notified pt of CT result, per MD note below. Pt will keep 1/14 appt as scheduled.

## 2017-11-25 ENCOUNTER — Inpatient Hospital Stay (HOSPITAL_BASED_OUTPATIENT_CLINIC_OR_DEPARTMENT_OTHER): Payer: 59 | Admitting: Oncology

## 2017-11-25 ENCOUNTER — Telehealth: Payer: Self-pay | Admitting: Nurse Practitioner

## 2017-11-25 ENCOUNTER — Encounter: Payer: Self-pay | Admitting: Oncology

## 2017-11-25 VITALS — BP 158/92 | HR 83 | Temp 99.0°F | Resp 18 | Ht 71.0 in | Wt 240.0 lb

## 2017-11-25 DIAGNOSIS — C186 Malignant neoplasm of descending colon: Secondary | ICD-10-CM

## 2017-11-25 DIAGNOSIS — D696 Thrombocytopenia, unspecified: Secondary | ICD-10-CM | POA: Diagnosis not present

## 2017-11-25 DIAGNOSIS — Z85038 Personal history of other malignant neoplasm of large intestine: Secondary | ICD-10-CM

## 2017-11-25 DIAGNOSIS — I10 Essential (primary) hypertension: Secondary | ICD-10-CM | POA: Diagnosis not present

## 2017-11-25 DIAGNOSIS — E669 Obesity, unspecified: Secondary | ICD-10-CM | POA: Diagnosis not present

## 2017-11-25 NOTE — Progress Notes (Signed)
  [facility] OFFICE PROGRESS NOTE   Diagnosis: Colon cancer  INTERVAL HISTORY:   Jermaine Moody returns as scheduled.  He feels well.  No difficulty with bowel function.  He would like to lose weight.  Objective:  Vital signs in last 24 hours:  Blood pressure (!) 158/92, pulse 83, temperature 99 F (37.2 C), temperature source Oral, resp. rate 18, height '5\' 11"'$  (1.803 m), weight 240 lb (108.9 kg), SpO2 100 %.    HEENT: Neck without mass Lymphatics: No cervical, supraclavicular, axillary, or inguinal nodes Resp: Lungs clear bilaterally Cardio: Regular rate and rhythm GI: No hepatosplenomegaly, no mass, nontender Vascular: No leg edema   Lab Results:  Lab Results  Component Value Date   WBC 7.3 11/21/2017   HGB 16.3 11/21/2017   HCT 48.0 11/21/2017   MCV 85.6 11/21/2017   PLT 94 (L) 11/21/2017   NEUTROABS 4.7 11/21/2017    CMP     Component Value Date/Time   NA 140 11/21/2017 0934   NA 141 05/21/2017 0953   K 4.1 11/21/2017 0934   K 3.9 05/21/2017 0953   CL 106 11/21/2017 0934   CO2 26 11/21/2017 0934   CO2 23 05/21/2017 0953   GLUCOSE 88 11/21/2017 0934   GLUCOSE 100 05/21/2017 0953   GLUCOSE 105 (H) 10/10/2006 0833   BUN 19 11/21/2017 0934   BUN 18.0 05/21/2017 0953   CREATININE 1.22 11/21/2017 0934   CREATININE 1.0 05/21/2017 0953   CALCIUM 9.2 11/21/2017 0934   CALCIUM 9.7 05/21/2017 0953   PROT 8.4 (H) 11/21/2017 0934   PROT 7.2 02/13/2017 0820   ALBUMIN 4.0 11/21/2017 0934   ALBUMIN 3.6 02/13/2017 0820   AST 18 11/21/2017 0934   AST 21 02/13/2017 0820   ALT 17 11/21/2017 0934   ALT 16 02/13/2017 0820   ALKPHOS 78 11/21/2017 0934   ALKPHOS 60 02/13/2017 0820   BILITOT 0.4 11/21/2017 0934   BILITOT 0.56 02/13/2017 0820   GFRNONAA >60 11/21/2017 0934   GFRAA >60 11/21/2017 0934    Lab Results  Component Value Date   CEA1 3.44 11/21/2017     Medications: I have reviewed the patient's current  medications.   Assessment/Plan:  1. Well-differentiated adenocarcinoma of the descending colon, stage III (T3 N1), status post a left colectomy 11/14/2016 ? 1/20 lymph nodes positive ? MSI-stable, no loss of mismatch repair protein expression ? Cycle 1 CAPOX 12/12/2016 ? Cycle 2 CAPOX 01/02/2017 ? Cycle 3 CAPOX 01/23/2017 ? Cycle 4 CAPOX 02/13/2017 ? CTs 11/21/2017-no evidence of recurrent disease  2. History of hepatitis C-treated with medical therapy  3. Hypertension  4. Nausea following cycle 1 CAPOX-emend added with cycle 2  5. Thrombocytopenia-predating chemotherapy, stable     Disposition: Jermaine Moody is in clinical remission from colon cancer.  He will return for an office visit and CEA in 6 months.  We will refer him to the cancer center nutritionist for a weight loss program.  15 minutes were spent with the patient today.  The majority of the time was used for counseling and coordination of care.  Betsy Coder, MD  11/25/2017  1:12 PM

## 2017-11-25 NOTE — Telephone Encounter (Signed)
Gave avs and calendar for july °

## 2018-02-07 MED FILL — HYDROCHLOROTHIAZIDE 12.5 MG: 12.5 | 90 days supply | Qty: 90 | Fill #1

## 2018-03-20 MED FILL — AMLODIPINE-BENAZEPRIL 5-40: 5-40 | 30 days supply | Qty: 30 | Fill #2

## 2018-04-22 ENCOUNTER — Other Ambulatory Visit: Payer: Self-pay | Admitting: Nutrition

## 2018-04-22 DIAGNOSIS — C186 Malignant neoplasm of descending colon: Secondary | ICD-10-CM

## 2018-04-23 DIAGNOSIS — Z125 Encounter for screening for malignant neoplasm of prostate: Secondary | ICD-10-CM | POA: Diagnosis not present

## 2018-04-23 DIAGNOSIS — Z0000 Encounter for general adult medical examination without abnormal findings: Secondary | ICD-10-CM | POA: Diagnosis not present

## 2018-04-23 DIAGNOSIS — I10 Essential (primary) hypertension: Secondary | ICD-10-CM | POA: Diagnosis not present

## 2018-04-23 DIAGNOSIS — N390 Urinary tract infection, site not specified: Secondary | ICD-10-CM | POA: Diagnosis not present

## 2018-04-23 MED FILL — CLOTRIMAZOLE-BETAMETHASONE: 1-0.05 | 30 days supply | Qty: 15 | Fill #0

## 2018-05-26 ENCOUNTER — Encounter: Payer: 59 | Admitting: Nutrition

## 2018-05-26 ENCOUNTER — Encounter: Payer: Self-pay | Admitting: Nurse Practitioner

## 2018-05-26 ENCOUNTER — Inpatient Hospital Stay: Payer: 59 | Attending: Nurse Practitioner | Admitting: Nurse Practitioner

## 2018-05-26 ENCOUNTER — Inpatient Hospital Stay: Payer: 59

## 2018-05-26 ENCOUNTER — Telehealth: Payer: Self-pay | Admitting: Nurse Practitioner

## 2018-05-26 VITALS — BP 146/97 | HR 74 | Temp 98.8°F | Resp 18 | Ht 71.0 in | Wt 232.6 lb

## 2018-05-26 DIAGNOSIS — Z85038 Personal history of other malignant neoplasm of large intestine: Secondary | ICD-10-CM | POA: Insufficient documentation

## 2018-05-26 DIAGNOSIS — D696 Thrombocytopenia, unspecified: Secondary | ICD-10-CM | POA: Insufficient documentation

## 2018-05-26 DIAGNOSIS — C186 Malignant neoplasm of descending colon: Secondary | ICD-10-CM

## 2018-05-26 DIAGNOSIS — I10 Essential (primary) hypertension: Secondary | ICD-10-CM | POA: Insufficient documentation

## 2018-05-26 DIAGNOSIS — Z9221 Personal history of antineoplastic chemotherapy: Secondary | ICD-10-CM | POA: Diagnosis not present

## 2018-05-26 LAB — CEA (IN HOUSE-CHCC): CEA (CHCC-IN HOUSE): 3.5 ng/mL (ref 0.00–5.00)

## 2018-05-26 NOTE — Telephone Encounter (Signed)
Gave patient avs and calendar of upcoming jan appts. °

## 2018-05-26 NOTE — Progress Notes (Signed)
  [facility] OFFICE PROGRESS NOTE   Diagnosis: Colon cancer  INTERVAL HISTORY:   Jermaine Moody returns as scheduled.  No change in bowel habits.  No bleeding with bowel movements.  He denies abdominal pain.  He has a good appetite.  He denies neuropathy symptoms.  Objective:  Vital signs in last 24 hours  Blood pressure (!) 146/97, pulse 74, temperature 98.8 F (37.1 C), temperature source Oral, resp. rate 18, height _0  (1.803 m), weight 232 lb 9.6 oz (105.5 kg), SpO2 99 %.    HEENT: Neck without mass. Lymphatics: No palpable cervical, supraclavicular, axillary or inguinal lymph nodes. Resp: Lungs clear bilaterally. Cardio: Regular rate and rhythm. GI: Abdomen soft and nontender.  No hepatomegaly.  No mass. Vascular: No leg edema.   Lab Results:  Lab Results  Component Value Date   WBC 7.3 11/21/2017   HGB 16.3 11/21/2017   HCT 48.0 11/21/2017   MCV 85.6 11/21/2017   PLT 94 (L) 11/21/2017   NEUTROABS 4.7 11/21/2017    Imaging:  No results found.  Medications: I have reviewed the patient's current medications.  Assessment/Plan: 1. Well-differentiated adenocarcinoma of the descending colon, stage III (T3 N1), status post a left colectomy 11/14/2016 ? 1/20 lymph nodes positive ? MSI-stable, no loss of mismatch repair protein expression ? Cycle 1 CAPOX 12/12/2016 ? Cycle 2 CAPOX 01/02/2017 ? Cycle 3 CAPOX 01/23/2017 ? Cycle 4 CAPOX 02/13/2017 ? CTs 11/21/2017-no evidence of recurrent disease  2. History of hepatitis C-treated with medical therapy  3. Hypertension  4. Nausea following cycle 1 CAPOX-emend added with cycle 2  5. Thrombocytopenia-predating chemotherapy, stable    Disposition: Jermaine Moody remains in clinical remission from colon cancer.  We will follow-up on the CEA from today. He will undergo surveillance CT scans in approximately 6 months.  He will return for a follow-up visit 2 to 3 days after the CTs. He will contact  the office in the interim with any problems.    Ned Card ANP/GNP-BC   05/26/2018  9:38 AM

## 2018-07-08 IMAGING — CT CT ABD-PELV W/ CM
3 of 5 series · 14 of 36 positions shown, 17 images · IV contrast (ISOVUE 300)
Comparison: 09/14/2016

CLINICAL DATA: Followup stage III colon carcinoma. Status post
surgery and chemotherapy.

EXAM:
CT CHEST, ABDOMEN, AND PELVIS WITH CONTRAST
TECHNIQUE: Multidetector CT imaging of the chest, abdomen and pelvis was
performed following the standard protocol during bolus
administration of intravenous contrast.
CONTRAST:  100mL [id number] IOPAMIDOL ([id number]) INJECTION 61%

[Series 2: cap with · axial · 0.85mm/px · z∈[-629,-104]mm · 9 of 133 slices shown, 12 images]
[im 14/133  mediastinal]
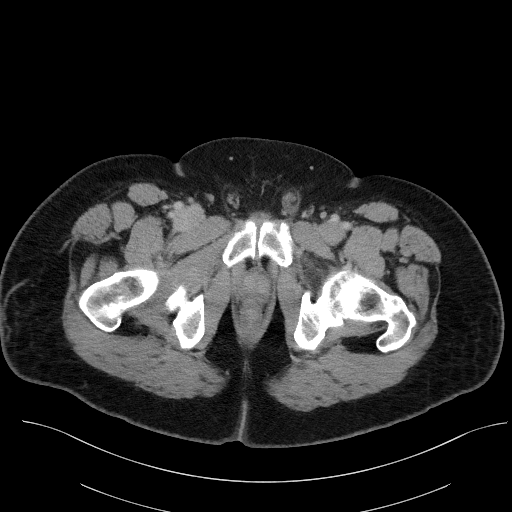
[im 14/133  lung]
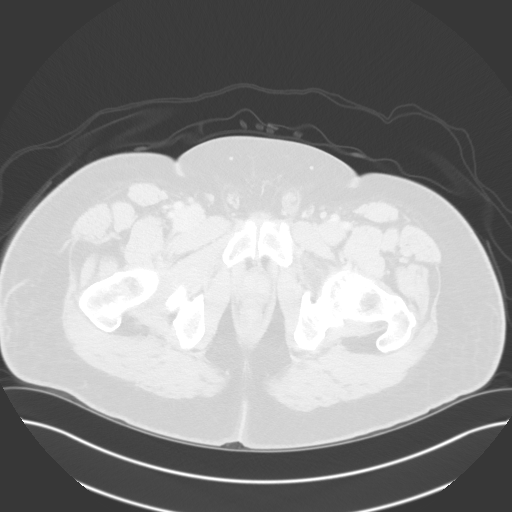
[im 27/133  lung]
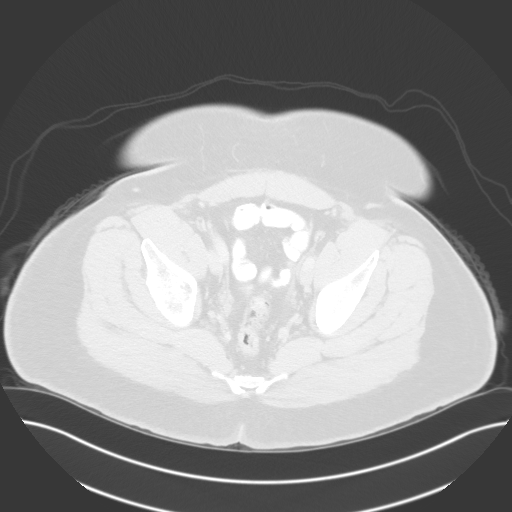
[im 40/133  lung]
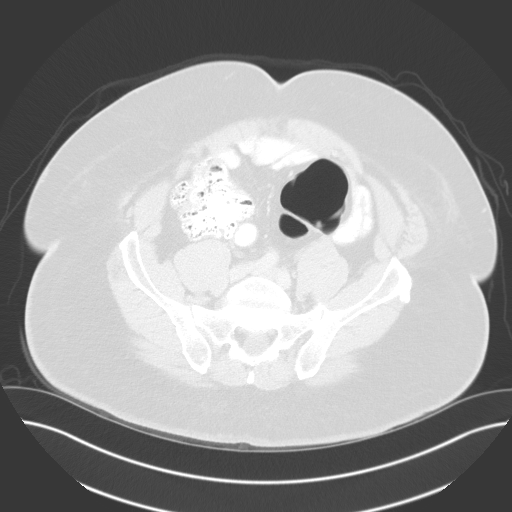
[im 53/133  lung]
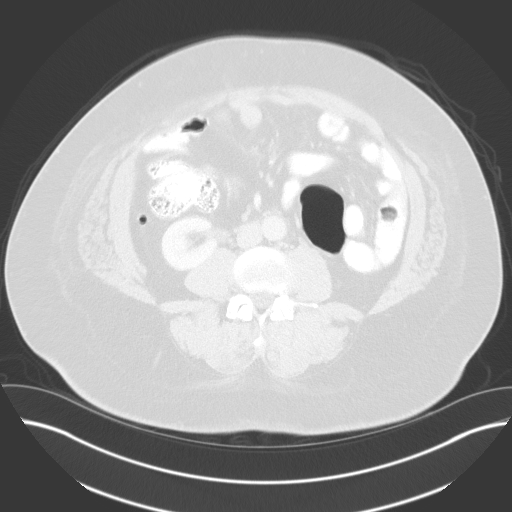
[im 67/133  mediastinal]
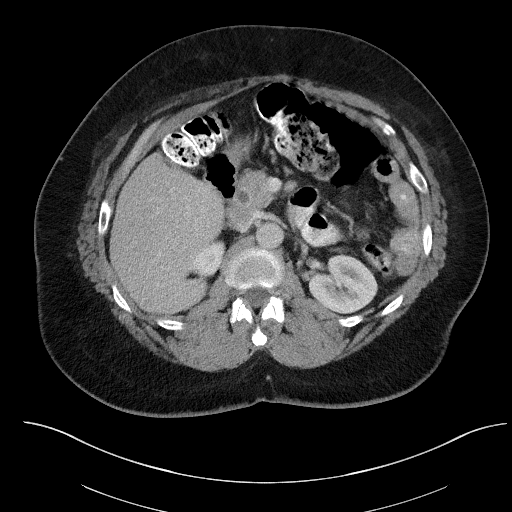
[im 67/133  lung]
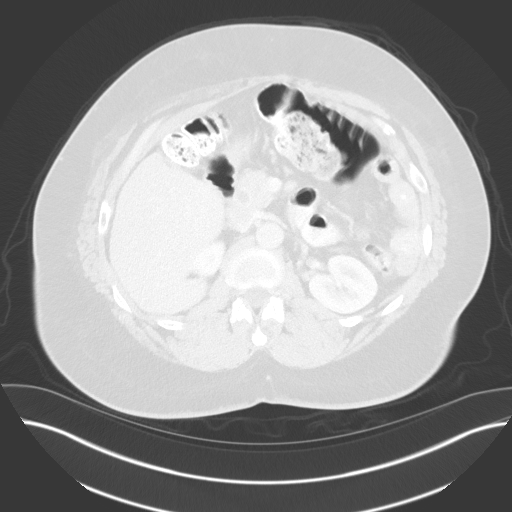
[im 80/133  lung]
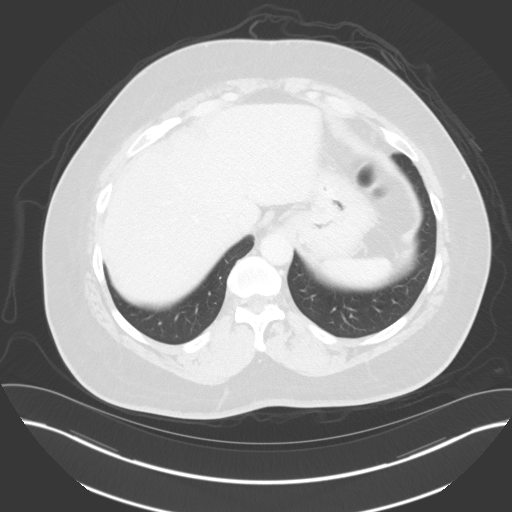
[im 93/133  lung]
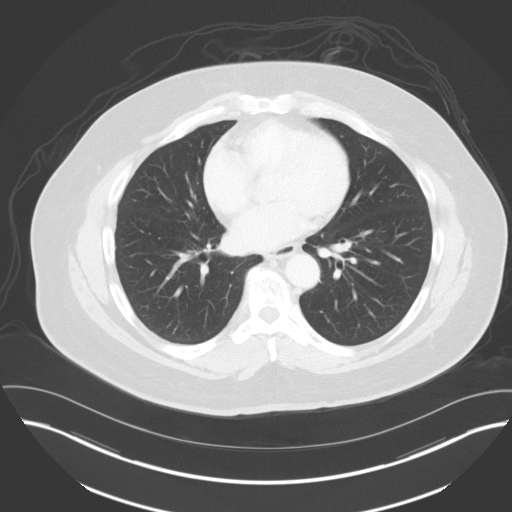
[im 106/133  lung]
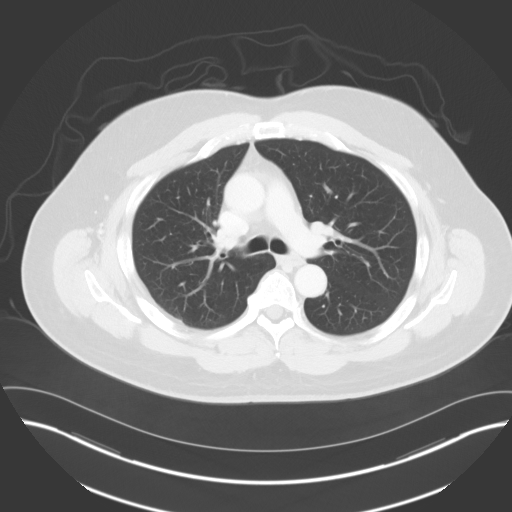
[im 119/133  mediastinal]
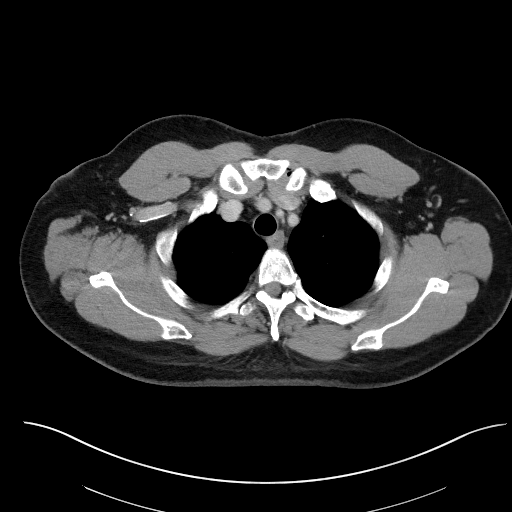
[im 119/133  lung]
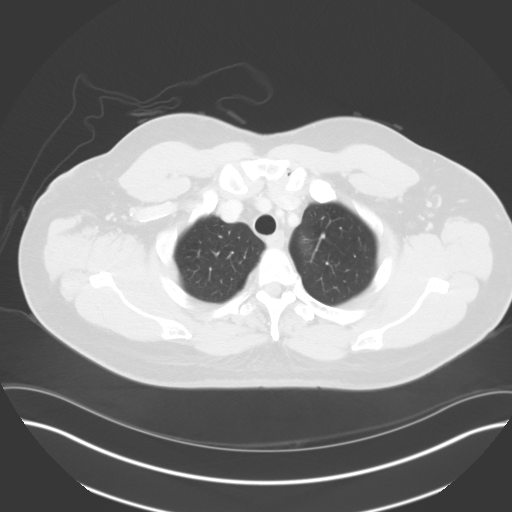

[Series 4: lung · axial · 0.85mm/px · z∈[-354,-302]mm · 2 of 174 slices shown]
[im 14/174  lung]
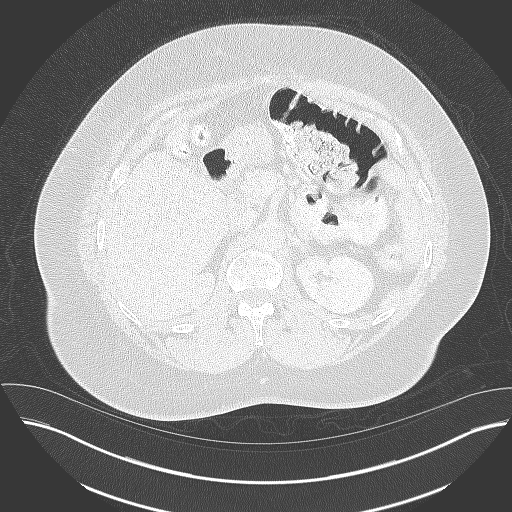
[im 40/174  lung]
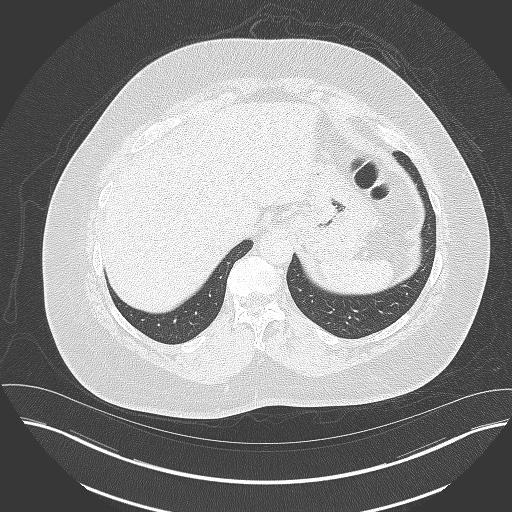

[Series 5: coronals · coronal · 0.83mm/px · 3 of 191 slices shown]
[im 39/191  lung]
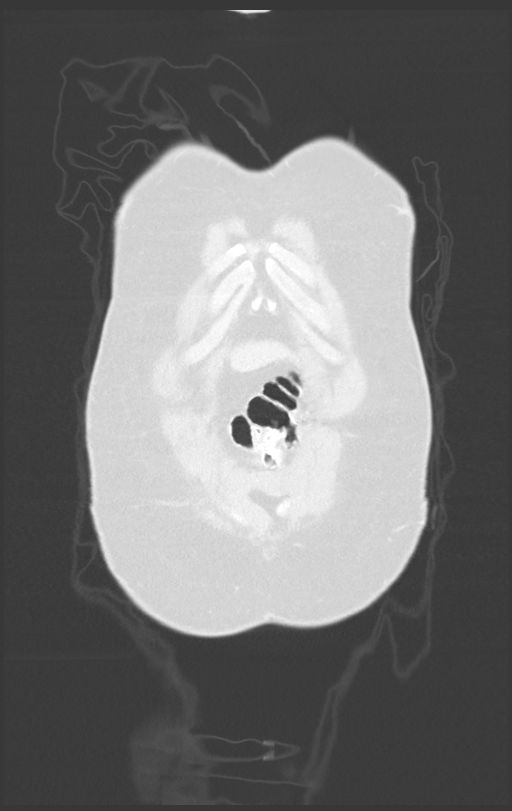
[im 77/191  lung]
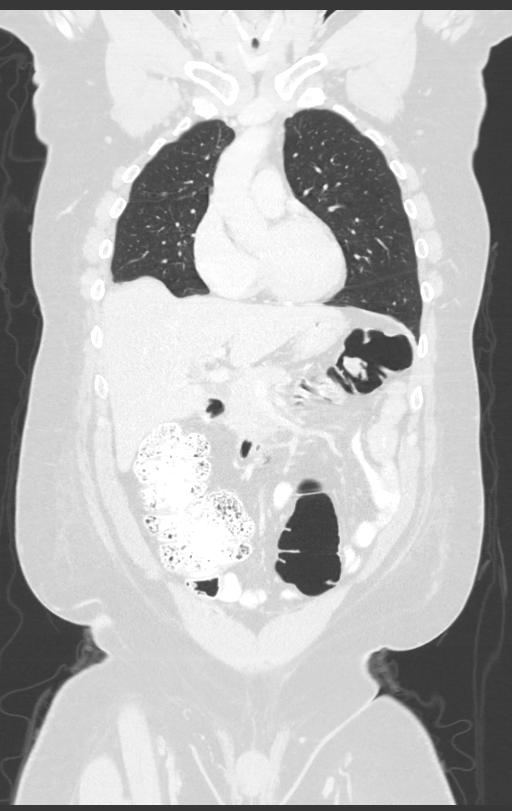
[im 115/191  lung]
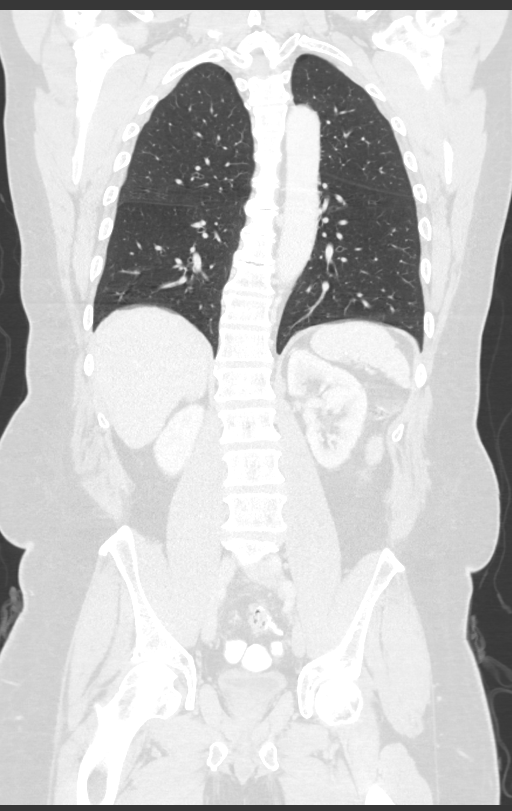

[14 of 36 positions shown; findings below may reference images not displayed]

FINDINGS: CT CHEST FINDINGS

Cardiovascular: No acute findings.

Mediastinum/Lymph Nodes: No masses or pathologically enlarged lymph
nodes identified.

Lungs/Pleura: No pulmonary infiltrate or mass identified. No
effusion present.

Musculoskeletal:  No suspicious bone lesions identified.

CT ABDOMEN AND PELVIS FINDINGS

Hepatobiliary: No masses identified.

Pancreas:  No mass or inflammatory changes.

Spleen:  Within normal limits in size and appearance.

Adrenals/Urinary tract:  No masses or hydronephrosis.

Stomach/Bowel: Anastomotic staples now seen in the sigmoid colon. No
evidence of mass or bowel wall thickening. Normal appendix
visualized. No evidence of inflammatory process or abnormal fluid
collections.

Vascular/Lymphatic: No pathologically enlarged lymph nodes
identified. No abdominal aortic aneurysm.

Reproductive:  No mass or other significant abnormality identified.

Other:  None.

Musculoskeletal:  No suspicious bone lesions identified.
IMPRESSION: Postop changes from previous sigmoid colon resection. No evidence of
recurrent or metastatic carcinoma, or other acute findings.

## 2018-08-04 MED FILL — HYDROCHLOROTHIAZIDE 12.5 MG: 12.5 | 90 days supply | Qty: 90 | Fill #2

## 2018-08-04 MED FILL — AMLODIPINE-BENAZEPRIL 5-40: 5-40 | 90 days supply | Qty: 90 | Fill #0

## 2018-08-25 DIAGNOSIS — Z6833 Body mass index (BMI) 33.0-33.9, adult: Secondary | ICD-10-CM | POA: Diagnosis not present

## 2018-08-25 DIAGNOSIS — I10 Essential (primary) hypertension: Secondary | ICD-10-CM | POA: Diagnosis not present

## 2018-08-25 DIAGNOSIS — E785 Hyperlipidemia, unspecified: Secondary | ICD-10-CM | POA: Diagnosis not present

## 2018-09-12 DIAGNOSIS — H5203 Hypermetropia, bilateral: Secondary | ICD-10-CM | POA: Diagnosis not present

## 2018-09-12 DIAGNOSIS — H40023 Open angle with borderline findings, high risk, bilateral: Secondary | ICD-10-CM | POA: Diagnosis not present

## 2018-09-12 DIAGNOSIS — H524 Presbyopia: Secondary | ICD-10-CM | POA: Diagnosis not present

## 2018-09-12 DIAGNOSIS — H25813 Combined forms of age-related cataract, bilateral: Secondary | ICD-10-CM | POA: Diagnosis not present

## 2018-11-26 ENCOUNTER — Inpatient Hospital Stay: Payer: 59 | Attending: Oncology

## 2018-11-26 ENCOUNTER — Ambulatory Visit (HOSPITAL_COMMUNITY)
Admission: RE | Admit: 2018-11-26 | Discharge: 2018-11-26 | Disposition: A | Discharge status: Home / Self Care | Payer: 59 | Source: Ambulatory Visit | Attending: Nurse Practitioner | Admitting: Nurse Practitioner

## 2018-11-26 ENCOUNTER — Encounter (HOSPITAL_COMMUNITY): Payer: Self-pay

## 2018-11-26 DIAGNOSIS — C186 Malignant neoplasm of descending colon: Secondary | ICD-10-CM | POA: Diagnosis not present

## 2018-11-26 DIAGNOSIS — Z85038 Personal history of other malignant neoplasm of large intestine: Secondary | ICD-10-CM | POA: Diagnosis not present

## 2018-11-26 DIAGNOSIS — I10 Essential (primary) hypertension: Secondary | ICD-10-CM | POA: Diagnosis not present

## 2018-11-26 DIAGNOSIS — Z9049 Acquired absence of other specified parts of digestive tract: Secondary | ICD-10-CM | POA: Diagnosis not present

## 2018-11-26 DIAGNOSIS — Z9221 Personal history of antineoplastic chemotherapy: Secondary | ICD-10-CM | POA: Diagnosis not present

## 2018-11-26 DIAGNOSIS — C189 Malignant neoplasm of colon, unspecified: Secondary | ICD-10-CM | POA: Diagnosis not present

## 2018-11-26 LAB — CBC WITH DIFFERENTIAL (CANCER CENTER ONLY)
ABS IMMATURE GRANULOCYTES: 0.02 10*3/uL (ref 0.00–0.07)
Basophils Absolute: 0.1 10*3/uL (ref 0.0–0.1)
Basophils Relative: 1 %
Eosinophils Absolute: 0.1 10*3/uL (ref 0.0–0.5)
Eosinophils Relative: 2 %
HEMATOCRIT: 46.7 % (ref 39.0–52.0)
HEMOGLOBIN: 15.3 g/dL (ref 13.0–17.0)
Immature Granulocytes: 0 %
LYMPHS PCT: 29 %
Lymphs Abs: 2.4 10*3/uL (ref 0.7–4.0)
MCH: 27.6 pg (ref 26.0–34.0)
MCHC: 32.8 g/dL (ref 30.0–36.0)
MCV: 84.3 fL (ref 80.0–100.0)
Monocytes Absolute: 0.8 10*3/uL (ref 0.1–1.0)
Monocytes Relative: 10 %
NEUTROS ABS: 4.7 10*3/uL (ref 1.7–7.7)
NRBC: 0 % (ref 0.0–0.2)
Neutrophils Relative %: 58 %
Platelet Count: 91 10*3/uL — ABNORMAL LOW (ref 150–400)
RBC: 5.54 MIL/uL (ref 4.22–5.81)
RDW: 14.5 % (ref 11.5–15.5)
WBC: 8 10*3/uL (ref 4.0–10.5)

## 2018-11-26 LAB — BASIC METABOLIC PANEL - CANCER CENTER ONLY
Anion gap: 12 (ref 5–15)
BUN: 20 mg/dL (ref 8–23)
CO2: 22 mmol/L (ref 22–32)
Calcium: 9.2 mg/dL (ref 8.9–10.3)
Chloride: 107 mmol/L (ref 98–111)
Creatinine: 1.23 mg/dL (ref 0.61–1.24)
GFR, Est AFR Am: >60 mL/min (ref >60)
GFR, Estimated: >60 mL/min (ref >60)
Glucose, Bld: 99 mg/dL (ref 70–99)
Potassium: 4 mmol/L (ref 3.5–5.1)
Sodium: 141 mmol/L (ref 135–145)

## 2018-11-26 LAB — CEA (IN HOUSE-CHCC): CEA (CHCC-In House): 3.5 ng/mL (ref 0.00–5.00)

## 2018-11-26 MED ORDER — IOHEXOL 300 MG/ML  SOLN
100.0000 mL | Freq: Once | INTRAMUSCULAR | Status: AC | PRN
  Administered 2018-11-26: 100 mL via INTRAVENOUS

## 2018-12-01 ENCOUNTER — Telehealth: Payer: Self-pay | Admitting: Oncology

## 2018-12-01 ENCOUNTER — Inpatient Hospital Stay (HOSPITAL_BASED_OUTPATIENT_CLINIC_OR_DEPARTMENT_OTHER): Payer: 59 | Admitting: Oncology

## 2018-12-01 VITALS — BP 127/71 | HR 89 | Temp 97.9°F | Resp 18 | Ht 71.0 in | Wt 240.4 lb

## 2018-12-01 DIAGNOSIS — Z9049 Acquired absence of other specified parts of digestive tract: Secondary | ICD-10-CM

## 2018-12-01 DIAGNOSIS — Z85038 Personal history of other malignant neoplasm of large intestine: Secondary | ICD-10-CM | POA: Diagnosis not present

## 2018-12-01 DIAGNOSIS — Z9221 Personal history of antineoplastic chemotherapy: Secondary | ICD-10-CM

## 2018-12-01 DIAGNOSIS — I10 Essential (primary) hypertension: Secondary | ICD-10-CM | POA: Diagnosis not present

## 2018-12-01 DIAGNOSIS — C186 Malignant neoplasm of descending colon: Secondary | ICD-10-CM

## 2018-12-01 DIAGNOSIS — D696 Thrombocytopenia, unspecified: Secondary | ICD-10-CM

## 2018-12-01 NOTE — Telephone Encounter (Signed)
Scheduled appt per 1/20 los - gave patient  AVS and calender per los.

## 2018-12-01 NOTE — Progress Notes (Signed)
  [facility] OFFICE PROGRESS NOTE   Diagnosis: Colon cancer  INTERVAL HISTORY:   Jermaine Moody returns as scheduled.  He feels well.  Good appetite.  No difficulty with bowel function.  Objective:  Vital signs in last 24 hours:  Blood pressure 127/71, pulse 89, temperature 97.9 F (36.6 C), temperature source Oral, resp. rate 18, height '5\' 11"'$  (1.803 m), weight 240 lb 6.4 oz (109 kg), SpO2 98 %.    HEENT: Neck without mass Lymphatics: No cervical, supraclavicular, axillary, or inguinal nodes Resp: Lungs clear bilaterally Cardio: Regular rate and rhythm GI: No hepatosplenomegaly, nontender, no mass Vascular: No leg edema   Lab Results:  Lab Results  Component Value Date   WBC 8.0 11/26/2018   HGB 15.3 11/26/2018   HCT 46.7 11/26/2018   MCV 84.3 11/26/2018   PLT 91 (L) 11/26/2018   NEUTROABS 4.7 11/26/2018    CMP  Lab Results  Component Value Date   NA 141 11/26/2018   K 4.0 11/26/2018   CL 107 11/26/2018   CO2 22 11/26/2018   GLUCOSE 99 11/26/2018   BUN 20 11/26/2018   CREATININE 1.23 11/26/2018   CALCIUM 9.2 11/26/2018   PROT 8.4 (H) 11/21/2017   ALBUMIN 4.0 11/21/2017   AST 18 11/21/2017   ALT 17 11/21/2017   ALKPHOS 78 11/21/2017   BILITOT 0.4 11/21/2017   GFRNONAA >60 11/26/2018   GFRAA >60 11/26/2018    Lab Results  Component Value Date   CEA1 3.50 11/26/2018   Medications: I have reviewed the patient's current medications.   Assessment/Plan: 1. Well-differentiated adenocarcinoma of the descending colon, stage III (T3 N1), status post a left colectomy 11/14/2016 ? 1/20 lymph nodes positive ? MSI-stable, no loss of mismatch repair protein expression ? Cycle 1 CAPOX 12/12/2016 ? Cycle 2 CAPOX 01/02/2017 ? Cycle 3 CAPOX 01/23/2017 ? Cycle 4 CAPOX 02/13/2017 ? CTs 11/21/2017-no evidence of recurrent disease ? CTs 11/26/2018- no evidence of recurrent disease  2. History of hepatitis C-treated with medical therapy  3.  Hypertension  4. Nausea following cycle 1 CAPOX-emend added with cycle 2  5. Thrombocytopenia-predating chemotherapy, stable     Disposition: Jermaine Moody is in clinical remission from colon cancer.  He will return for an office visit and CEA in 6 months.  He will be due for a surveillance colonoscopy in November 2021.   Betsy Coder, MD  12/01/2018  11:40 AM

## 2018-12-29 DIAGNOSIS — I10 Essential (primary) hypertension: Secondary | ICD-10-CM | POA: Diagnosis not present

## 2019-01-07 MED FILL — HYDROCHLOROTHIAZIDE 12.5 MG: 12.5 | 90 days supply | Qty: 90 | Fill #0

## 2019-01-07 MED FILL — AMLODIPINE-BENAZEPRIL 5-40: 5-40 | 90 days supply | Qty: 90 | Fill #0

## 2019-05-07 MED FILL — AMLODIPINE-BENAZEPRIL 5-40: 5-40 | 90 days supply | Qty: 90 | Fill #0

## 2019-05-07 MED FILL — HYDROCHLOROTHIAZIDE 12.5 MG: 12.5 | 90 days supply | Qty: 90 | Fill #1

## 2019-06-01 ENCOUNTER — Inpatient Hospital Stay: Payer: 59 | Attending: Nurse Practitioner

## 2019-06-01 ENCOUNTER — Inpatient Hospital Stay: Payer: 59 | Admitting: Nurse Practitioner

## 2019-06-29 DIAGNOSIS — I10 Essential (primary) hypertension: Secondary | ICD-10-CM | POA: Diagnosis not present

## 2019-07-07 DIAGNOSIS — H11421 Conjunctival edema, right eye: Secondary | ICD-10-CM | POA: Diagnosis not present

## 2019-07-07 DIAGNOSIS — H10231 Serous conjunctivitis, except viral, right eye: Secondary | ICD-10-CM | POA: Diagnosis not present

## 2019-07-07 MED FILL — NEO/POLYMYXIN/DEXAMETH DROP: 3.5-10000-0 | 12 days supply | Qty: 5 | Fill #0

## 2019-07-29 MED FILL — HYDROCHLOROTHIAZIDE 12.5 MG: 12.5 | 90 days supply | Qty: 90 | Fill #0

## 2019-07-29 MED FILL — AMLODIPINE-BENAZEPRIL 5-40: 5-40 | 90 days supply | Qty: 90 | Fill #0

## 2019-11-23 DIAGNOSIS — I10 Essential (primary) hypertension: Secondary | ICD-10-CM | POA: Diagnosis not present

## 2019-11-23 DIAGNOSIS — Z7189 Other specified counseling: Secondary | ICD-10-CM | POA: Diagnosis not present

## 2019-12-08 MED FILL — HYDROCHLOROTHIAZIDE 12.5 MG: 12.5 | 90 days supply | Qty: 90 | Fill #1

## 2019-12-10 MED FILL — AMLODIPINE-BENAZEPRIL 5-40: 5-40 | 90 days supply | Qty: 90 | Fill #0

## 2020-04-02 MED FILL — AMLODIPINE-BENAZEPRIL 5-40: 5-40 | 90 days supply | Qty: 90 | Fill #0

## 2020-04-04 MED FILL — HYDROCHLOROTHIAZIDE 12.5 MG: 12.5 | 90 days supply | Qty: 90 | Fill #0

## 2020-04-04 MED FILL — AMLODIPINE-BENAZEPRIL 5-40: 5-40 | 90 days supply | Qty: 90 | Fill #1

## 2020-05-30 DIAGNOSIS — J301 Allergic rhinitis due to pollen: Secondary | ICD-10-CM | POA: Diagnosis not present

## 2020-05-30 DIAGNOSIS — I10 Essential (primary) hypertension: Secondary | ICD-10-CM | POA: Diagnosis not present

## 2020-08-12 MED FILL — AMLODIPINE-BENAZEPRIL 5-40: 5-40 | 90 days supply | Qty: 90 | Fill #0

## 2020-08-12 MED FILL — HYDROCHLOROTHIAZIDE 12.5 MG: 12.5 | 90 days supply | Qty: 90 | Fill #1

## 2020-11-16 ENCOUNTER — Other Ambulatory Visit (HOSPITAL_COMMUNITY): Payer: Self-pay | Admitting: Family Medicine

## 2020-11-16 MED FILL — HYDROCHLOROTHIAZIDE 12.5 MG: 12.5 | 90 days supply | Qty: 90 | Fill #0

## 2020-11-18 ENCOUNTER — Other Ambulatory Visit (HOSPITAL_COMMUNITY): Payer: Self-pay | Admitting: Family Medicine

## 2020-11-18 MED FILL — AMLODIPINE-BENAZEPRIL 5-40: 5-40 | 90 days supply | Qty: 90 | Fill #0

## 2020-11-20 ENCOUNTER — Other Ambulatory Visit (HOSPITAL_COMMUNITY): Payer: Self-pay | Admitting: Family Medicine

## 2020-12-26 ENCOUNTER — Other Ambulatory Visit (HOSPITAL_COMMUNITY): Payer: Self-pay | Admitting: Family Medicine

## 2020-12-26 DIAGNOSIS — I10 Essential (primary) hypertension: Secondary | ICD-10-CM | POA: Diagnosis not present

## 2020-12-26 DIAGNOSIS — J00 Acute nasopharyngitis [common cold]: Secondary | ICD-10-CM | POA: Diagnosis not present

## 2020-12-26 DIAGNOSIS — Z7189 Other specified counseling: Secondary | ICD-10-CM | POA: Diagnosis not present

## 2020-12-28 ENCOUNTER — Encounter: Payer: Self-pay | Admitting: Gastroenterology

## 2021-02-10 ENCOUNTER — Ambulatory Visit (AMBULATORY_SURGERY_CENTER): Payer: Self-pay | Admitting: *Deleted

## 2021-02-10 ENCOUNTER — Other Ambulatory Visit: Payer: Self-pay | Admitting: Gastroenterology

## 2021-02-10 ENCOUNTER — Other Ambulatory Visit: Payer: Self-pay

## 2021-02-10 VITALS — Ht 71.0 in | Wt 250.0 lb

## 2021-02-10 DIAGNOSIS — Z85038 Personal history of other malignant neoplasm of large intestine: Secondary | ICD-10-CM

## 2021-02-10 DIAGNOSIS — Z86010 Personal history of colonic polyps: Secondary | ICD-10-CM

## 2021-02-10 MED ORDER — SUPREP BOWEL PREP KIT 17.5-3.13-1.6 GM/177ML PO SOLN: 1.0000 | Freq: Once | ORAL | 0 refills | Status: DC

## 2021-02-10 MED FILL — SUPREP BOWEL PREP KIT: 17.5-3.13-1 | 1 days supply | Qty: 354 | Fill #0

## 2021-02-10 NOTE — Progress Notes (Signed)

## 2021-02-23 ENCOUNTER — Encounter: Payer: Self-pay | Admitting: Gastroenterology

## 2021-03-06 ENCOUNTER — Encounter: Payer: Self-pay | Admitting: Gastroenterology

## 2021-03-06 ENCOUNTER — Other Ambulatory Visit: Payer: Self-pay

## 2021-03-06 ENCOUNTER — Ambulatory Visit (AMBULATORY_SURGERY_CENTER): Payer: 59 | Admitting: Gastroenterology

## 2021-03-06 VITALS — BP 104/54 | HR 63 | Temp 98.6°F | Resp 15 | Ht 71.0 in | Wt 250.0 lb

## 2021-03-06 DIAGNOSIS — D122 Benign neoplasm of ascending colon: Secondary | ICD-10-CM | POA: Diagnosis not present

## 2021-03-06 DIAGNOSIS — Z1211 Encounter for screening for malignant neoplasm of colon: Secondary | ICD-10-CM | POA: Diagnosis not present

## 2021-03-06 DIAGNOSIS — I10 Essential (primary) hypertension: Secondary | ICD-10-CM | POA: Diagnosis not present

## 2021-03-06 DIAGNOSIS — Z86010 Personal history of colonic polyps: Secondary | ICD-10-CM

## 2021-03-06 DIAGNOSIS — Z85038 Personal history of other malignant neoplasm of large intestine: Secondary | ICD-10-CM

## 2021-03-06 MED ORDER — SODIUM CHLORIDE 0.9 % IV SOLN: 500.0000 mL | INTRAVENOUS | Status: DC

## 2021-03-06 NOTE — Progress Notes (Signed)
VS-CW  Pt's states no medical or surgical changes since previsit or office visit.  

## 2021-03-06 NOTE — Patient Instructions (Signed)
Impression/Recommendations:  Polyp and hemorrhoid handouts given to patient.  Resume previous diet. Continue present medications. Await pathology results.  Repeat colonoscopy for surveillance.  Date to be determined after pathology results.  YOU HAD AN ENDOSCOPIC PROCEDURE TODAY AT [facility]:   Refer to the procedure report that was given to you for any specific questions about what was found during the examination.  If the procedure report does not answer your questions, please call your gastroenterologist to clarify.  If you requested that your care partner not be given the details of your procedure findings, then the procedure report has been included in a sealed envelope for you to review at your convenience later.  YOU SHOULD EXPECT: Some feelings of bloating in the abdomen. Passage of more gas than usual.  Walking can help get rid of the air that was put into your GI tract during the procedure and reduce the bloating. If you had a lower endoscopy (such as a colonoscopy or flexible sigmoidoscopy) you may notice spotting of blood in your stool or on the toilet paper. If you underwent a bowel prep for your procedure, you may not have a normal bowel movement for a few days.  Please Note:  You might notice some irritation and congestion in your nose or some drainage.  This is from the oxygen used during your procedure.  There is no need for concern and it should clear up in a day or so.  SYMPTOMS TO REPORT IMMEDIATELY:   Following lower endoscopy (colonoscopy or flexible sigmoidoscopy):  Excessive amounts of blood in the stool  Significant tenderness or worsening of abdominal pains  Swelling of the abdomen that is new, acute  Fever of 100F or higher  For urgent or emergent issues, a gastroenterologist can be reached at any hour by calling [phone]. Do not use MyChart messaging for urgent concerns.    DIET:  We do recommend a small meal at first, but then you  may proceed to your regular diet.  Drink plenty of fluids but you should avoid alcoholic beverages for 24 hours.  ACTIVITY:  You should plan to take it easy for the rest of today and you should NOT DRIVE or use heavy machinery until tomorrow (because of the sedation medicines used during the test).    FOLLOW UP: Our staff will call the number listed on your records 48-72 hours following your procedure to check on you and address any questions or concerns that you may have regarding the information given to you following your procedure. If we do not reach you, we will leave a message.  We will attempt to reach you two times.  During this call, we will ask if you have developed any symptoms of COVID 19. If you develop any symptoms (ie: fever, flu-like symptoms, shortness of breath, cough etc.) before then, please call [phone].  If you test positive for Covid 19 in the 2 weeks post procedure, please call and report this information to [place].    If any biopsies were taken you will be contacted by phone or by letter within the next 1-3 weeks.  Please call us at [phone] if you have not heard about the biopsies in 3 weeks.    SIGNATURES/CONFIDENTIALITY: You and/or your care partner have signed paperwork which will be entered into your electronic medical record.  These signatures attest to the fact that that the information above on your After Visit Summary has been reviewed and is understood.  Full  responsibility of the confidentiality of this discharge information lies with you and/or your care-partner.

## 2021-03-06 NOTE — Op Note (Signed)
[facility] Patient Name: Jermaine Moody Procedure Date: 03/06/2021 3:08 PM MRN: [id number] Endoscopist: Remo Lipps P. Havery Moros , MD Age: 69 Referring MD:  Date of Birth: [birth year] Gender: Male Account #: [id number] Procedure:                Colonoscopy Indications:              High risk colon cancer surveillance: Personal                            history of colon cancer (2017), 4 polyps removed in                            2018 Medicines:                Monitored Anesthesia Care Procedure:                Pre-Anesthesia Assessment:                           - Prior to the procedure, a History and Physical                            was performed, and patient medications and                            allergies were reviewed. The patient's tolerance of                            previous anesthesia was also reviewed. The risks                            and benefits of the procedure and the sedation                            options and risks were discussed with the patient.                            All questions were answered, and informed consent                            was obtained. Prior Anticoagulants: The patient has                            taken no previous anticoagulant or antiplatelet                            agents. ASA Grade Assessment: II - A patient with                            mild systemic disease. After reviewing the risks                            and benefits, the patient was deemed in  satisfactory condition to undergo the procedure.                           After obtaining informed consent, the colonoscope                            was passed under direct vision. Throughout the                            procedure, the patient's blood pressure, pulse, and                            oxygen saturations were monitored continuously. The                            Olympus CF-HQ190L [id number]) Colonoscope was                             introduced through the anus and advanced to the the                            cecum, identified by appendiceal orifice and                            ileocecal valve. The colonoscopy was performed                            without difficulty. The patient tolerated the                            procedure well. The quality of the bowel                            preparation was good. The ileocecal valve,                            appendiceal orifice, and rectum were photographed. Scope In: 3:25:28 PM Scope Out: 3:42:32 PM Scope Withdrawal Time: 0 hours 15 minutes 38 seconds  Total Procedure Duration: 0 hours 17 minutes 4 seconds  Findings:                 The perianal and digital rectal examinations were                            normal.                           There was evidence of a prior end-to-end                            colo-colonic anastomosis in the sigmoid colon. This                            was patent and was characterized by healthy  appearing mucosa.                           Four medium-sized angiodysplastic lesions were                            found in the transverse colon (1), in the ascending                            colon (1) and in the cecum (2).                           Two sessile polyps were found in the ascending                            colon. The polyps were 3 mm in size. These polyps                            were removed with a cold snare. Resection and                            retrieval were complete.                           Internal hemorrhoids were found during retroflexion.                           The exam was otherwise without abnormality. Complications:            No immediate complications. Estimated blood loss:                            Minimal. Estimated Blood Loss:     Estimated blood loss was minimal. Impression:               - Patent end-to-end colo-colonic anastomosis,                             characterized by healthy appearing mucosa.                           - Four colonic angiodysplastic lesions.                           - Two 3 mm polyps in the ascending colon, removed                            with a cold snare. Resected and retrieved.                           - Internal hemorrhoids.                           - The examination was otherwise normal. Recommendation:           - Patient has a contact number available for  emergencies. The signs and symptoms of potential                            delayed complications were discussed with the                            patient. Return to normal activities tomorrow.                            Written discharge instructions were provided to the                            patient.                           - Resume previous diet.                           - Continue present medications.                           - Await pathology results.                           - Anticipate repeat colonoscopy in 5 years for                            surveillance purposes Carlota Raspberry. Vinicius Brockman, MD 03/06/2021 3:47:21 PM This report has been signed electronically.

## 2021-03-06 NOTE — Progress Notes (Signed)
Report given to PACU, vss 

## 2021-03-06 NOTE — Progress Notes (Signed)
1530 Ephedrine 10 mg given IV due to low BP, MD updated.  

## 2021-03-07 ENCOUNTER — Other Ambulatory Visit (HOSPITAL_COMMUNITY): Payer: Self-pay

## 2021-03-07 MED FILL — Hydrochlorothiazide Cap 12.5 MG: ORAL | 90 days supply | Qty: 90 | Fill #0 | Status: AC

## 2021-03-07 MED FILL — Amlodipine Besylate-Benazepril HCl Cap 5-40 MG: ORAL | 90 days supply | Qty: 90 | Fill #0 | Status: AC

## 2021-03-08 ENCOUNTER — Telehealth: Payer: Self-pay

## 2021-03-08 NOTE — Telephone Encounter (Signed)
  Follow up Call-  Call back number 03/06/2021  Post procedure Call Back phone  # [phone]  Permission to leave phone message Yes  Some recent data might be hidden     Patient questions:  Do you have a fever, pain , or abdominal swelling? No. Pain Score  0 *  Have you tolerated food without any problems? Yes.    Have you been able to return to your normal activities? Yes.    Do you have any questions about your discharge instructions: Diet   No. Medications  No. Follow up visit  No.  Do you have questions or concerns about your Care? No.  Actions: * If pain score is 4 or above: No action needed, pain <4.  1. Have you developed a fever since your procedure? no  2.   Have you had an respiratory symptoms (SOB or cough) since your procedure? no  3.   Have you tested positive for COVID 19 since your procedure no  4.   Have you had any family members/close contacts diagnosed with the COVID 19 since your procedure?  no   If yes to any of these questions please route to Joylene John, RN and Joella Prince, RN

## 2021-05-01 ENCOUNTER — Other Ambulatory Visit (HOSPITAL_COMMUNITY): Payer: Self-pay

## 2021-05-01 DIAGNOSIS — F5221 Male erectile disorder: Secondary | ICD-10-CM | POA: Diagnosis not present

## 2021-05-01 DIAGNOSIS — E785 Hyperlipidemia, unspecified: Secondary | ICD-10-CM | POA: Diagnosis not present

## 2021-05-01 DIAGNOSIS — I10 Essential (primary) hypertension: Secondary | ICD-10-CM | POA: Diagnosis not present

## 2021-05-01 MED ORDER — SILDENAFIL CITRATE 50 MG PO TABS
50.0000 mg | ORAL_TABLET | Freq: Every day | ORAL | 3 refills | Status: DC | PRN
  Filled 2021-05-01: qty 3, 15d supply, fill #0

## 2021-05-01 MED ORDER — HYDROCHLOROTHIAZIDE 12.5 MG PO CAPS
12.5000 mg | ORAL_CAPSULE | Freq: Every day | ORAL | 1 refills | Status: DC
  Filled 2021-05-01 – 2021-06-29 (×2): qty 90, 90d supply, fill #0
  Filled 2021-10-26: qty 90, 90d supply, fill #1

## 2021-05-01 MED ORDER — AMLODIPINE BESY-BENAZEPRIL HCL 5-40 MG PO CAPS
1.0000 | ORAL_CAPSULE | Freq: Every day | ORAL | 1 refills | Status: DC
  Filled 2021-05-01: qty 90, 90d supply, fill #0
  Filled 2022-01-24: qty 80, 80d supply, fill #0
  Filled 2022-01-24: qty 10, 10d supply, fill #0
  Filled 2022-04-28: qty 90, 90d supply, fill #1

## 2021-06-29 ENCOUNTER — Other Ambulatory Visit (HOSPITAL_COMMUNITY): Payer: Self-pay

## 2021-06-30 ENCOUNTER — Other Ambulatory Visit (HOSPITAL_COMMUNITY): Payer: Self-pay

## 2021-06-30 MED ORDER — AMLODIPINE BESY-BENAZEPRIL HCL 5-40 MG PO CAPS
1.0000 | ORAL_CAPSULE | Freq: Every day | ORAL | 1 refills | Status: DC
  Filled 2021-06-30: qty 90, 90d supply, fill #0
  Filled 2021-10-26: qty 90, 90d supply, fill #1

## 2021-07-27 ENCOUNTER — Other Ambulatory Visit (HOSPITAL_COMMUNITY): Payer: Self-pay

## 2021-07-27 DIAGNOSIS — Z85038 Personal history of other malignant neoplasm of large intestine: Secondary | ICD-10-CM | POA: Diagnosis not present

## 2021-07-27 DIAGNOSIS — E669 Obesity, unspecified: Secondary | ICD-10-CM | POA: Diagnosis not present

## 2021-07-27 DIAGNOSIS — E6609 Other obesity due to excess calories: Secondary | ICD-10-CM | POA: Diagnosis not present

## 2021-07-27 DIAGNOSIS — Z6833 Body mass index (BMI) 33.0-33.9, adult: Secondary | ICD-10-CM | POA: Diagnosis not present

## 2021-07-27 DIAGNOSIS — Z720 Tobacco use: Secondary | ICD-10-CM | POA: Diagnosis not present

## 2021-07-27 DIAGNOSIS — M7918 Myalgia, other site: Secondary | ICD-10-CM | POA: Diagnosis not present

## 2021-07-27 DIAGNOSIS — I10 Essential (primary) hypertension: Secondary | ICD-10-CM | POA: Diagnosis not present

## 2021-07-27 MED ORDER — ETODOLAC 400 MG PO TABS
400.0000 mg | ORAL_TABLET | Freq: Two times a day (BID) | ORAL | 0 refills | Status: DC
  Filled 2021-07-27: qty 60, 30d supply, fill #0

## 2021-07-27 MED ORDER — CYCLOBENZAPRINE HCL 10 MG PO TABS
10.0000 mg | ORAL_TABLET | Freq: Three times a day (TID) | ORAL | 0 refills | Status: DC | PRN
  Filled 2021-07-27: qty 45, 15d supply, fill #0

## 2021-09-18 DIAGNOSIS — K8010 Calculus of gallbladder with chronic cholecystitis without obstruction: Secondary | ICD-10-CM | POA: Diagnosis not present

## 2021-09-18 DIAGNOSIS — E1165 Type 2 diabetes mellitus with hyperglycemia: Secondary | ICD-10-CM | POA: Diagnosis not present

## 2021-09-18 DIAGNOSIS — I10 Essential (primary) hypertension: Secondary | ICD-10-CM | POA: Diagnosis not present

## 2021-09-18 DIAGNOSIS — E785 Hyperlipidemia, unspecified: Secondary | ICD-10-CM | POA: Diagnosis not present

## 2021-09-19 ENCOUNTER — Encounter: Payer: Self-pay | Admitting: Oncology

## 2021-09-19 ENCOUNTER — Other Ambulatory Visit (HOSPITAL_COMMUNITY): Payer: Self-pay

## 2021-09-19 MED ORDER — CHLORPHEN-PE-ACETAMINOPHEN 4-10-325 MG PO TABS
0.5000 | ORAL_TABLET | Freq: Every day | ORAL | 0 refills | Status: DC
  Filled 2021-09-19: qty 30, 60d supply, fill #0

## 2021-10-26 ENCOUNTER — Other Ambulatory Visit (HOSPITAL_COMMUNITY): Payer: Self-pay

## 2021-10-27 ENCOUNTER — Other Ambulatory Visit (HOSPITAL_COMMUNITY): Payer: Self-pay

## 2022-01-15 DIAGNOSIS — E785 Hyperlipidemia, unspecified: Secondary | ICD-10-CM | POA: Diagnosis not present

## 2022-01-15 DIAGNOSIS — J209 Acute bronchitis, unspecified: Secondary | ICD-10-CM | POA: Diagnosis not present

## 2022-01-15 DIAGNOSIS — E1165 Type 2 diabetes mellitus with hyperglycemia: Secondary | ICD-10-CM | POA: Diagnosis not present

## 2022-01-15 DIAGNOSIS — I10 Essential (primary) hypertension: Secondary | ICD-10-CM | POA: Diagnosis not present

## 2022-01-15 DIAGNOSIS — J301 Allergic rhinitis due to pollen: Secondary | ICD-10-CM | POA: Diagnosis not present

## 2022-01-21 ENCOUNTER — Other Ambulatory Visit (HOSPITAL_COMMUNITY): Payer: Self-pay

## 2022-01-22 DIAGNOSIS — J301 Allergic rhinitis due to pollen: Secondary | ICD-10-CM | POA: Diagnosis not present

## 2022-01-22 DIAGNOSIS — J209 Acute bronchitis, unspecified: Secondary | ICD-10-CM | POA: Diagnosis not present

## 2022-01-24 ENCOUNTER — Other Ambulatory Visit (HOSPITAL_COMMUNITY): Payer: Self-pay

## 2022-01-26 ENCOUNTER — Other Ambulatory Visit (HOSPITAL_COMMUNITY): Payer: Self-pay

## 2022-01-30 ENCOUNTER — Other Ambulatory Visit (HOSPITAL_COMMUNITY): Payer: Self-pay

## 2022-02-02 ENCOUNTER — Other Ambulatory Visit (HOSPITAL_COMMUNITY): Payer: Self-pay

## 2022-03-19 DIAGNOSIS — E785 Hyperlipidemia, unspecified: Secondary | ICD-10-CM | POA: Diagnosis not present

## 2022-03-19 DIAGNOSIS — I10 Essential (primary) hypertension: Secondary | ICD-10-CM | POA: Diagnosis not present

## 2022-03-19 DIAGNOSIS — Z125 Encounter for screening for malignant neoplasm of prostate: Secondary | ICD-10-CM | POA: Diagnosis not present

## 2022-03-19 DIAGNOSIS — Z0001 Encounter for general adult medical examination with abnormal findings: Secondary | ICD-10-CM | POA: Diagnosis not present

## 2022-04-30 ENCOUNTER — Other Ambulatory Visit (HOSPITAL_COMMUNITY): Payer: Self-pay

## 2022-05-01 ENCOUNTER — Other Ambulatory Visit (HOSPITAL_COMMUNITY): Payer: Self-pay

## 2022-05-04 ENCOUNTER — Other Ambulatory Visit (HOSPITAL_COMMUNITY): Payer: Self-pay

## 2022-05-08 ENCOUNTER — Other Ambulatory Visit (HOSPITAL_COMMUNITY): Payer: Self-pay

## 2022-05-09 ENCOUNTER — Other Ambulatory Visit (HOSPITAL_COMMUNITY): Payer: Self-pay

## 2022-05-09 MED ORDER — HYDROCHLOROTHIAZIDE 12.5 MG PO CAPS
12.5000 mg | ORAL_CAPSULE | Freq: Every day | ORAL | 1 refills | Status: DC
  Filled 2022-05-09: qty 90, 90d supply, fill #0
  Filled 2022-09-06: qty 90, 90d supply, fill #1

## 2022-07-30 DIAGNOSIS — B353 Tinea pedis: Secondary | ICD-10-CM | POA: Diagnosis not present

## 2022-07-30 DIAGNOSIS — E785 Hyperlipidemia, unspecified: Secondary | ICD-10-CM | POA: Diagnosis not present

## 2022-07-30 DIAGNOSIS — Z6834 Body mass index (BMI) 34.0-34.9, adult: Secondary | ICD-10-CM | POA: Diagnosis not present

## 2022-07-30 DIAGNOSIS — R0981 Nasal congestion: Secondary | ICD-10-CM | POA: Diagnosis not present

## 2022-07-30 DIAGNOSIS — I10 Essential (primary) hypertension: Secondary | ICD-10-CM | POA: Diagnosis not present

## 2022-09-06 ENCOUNTER — Other Ambulatory Visit (HOSPITAL_COMMUNITY): Payer: Self-pay

## 2022-09-10 ENCOUNTER — Other Ambulatory Visit (HOSPITAL_COMMUNITY): Payer: Self-pay

## 2022-09-11 ENCOUNTER — Other Ambulatory Visit (HOSPITAL_COMMUNITY): Payer: Self-pay

## 2022-09-13 ENCOUNTER — Other Ambulatory Visit (HOSPITAL_COMMUNITY): Payer: Self-pay

## 2022-09-13 MED ORDER — AMLODIPINE BESY-BENAZEPRIL HCL 5-40 MG PO CAPS
1.0000 | ORAL_CAPSULE | Freq: Every day | ORAL | 1 refills | Status: DC
  Filled 2022-09-13: qty 90, 90d supply, fill #0
  Filled 2022-11-29 (×2): qty 90, 90d supply, fill #1

## 2022-10-25 ENCOUNTER — Encounter: Payer: Self-pay | Admitting: Oncology

## 2022-10-27 ENCOUNTER — Encounter: Payer: Self-pay | Admitting: Oncology

## 2022-11-29 ENCOUNTER — Other Ambulatory Visit (HOSPITAL_COMMUNITY): Payer: Self-pay

## 2022-12-03 ENCOUNTER — Other Ambulatory Visit (HOSPITAL_COMMUNITY): Payer: Self-pay

## 2022-12-03 ENCOUNTER — Encounter: Payer: Self-pay | Admitting: Oncology

## 2022-12-03 DIAGNOSIS — I10 Essential (primary) hypertension: Secondary | ICD-10-CM | POA: Diagnosis not present

## 2022-12-03 DIAGNOSIS — I4891 Unspecified atrial fibrillation: Secondary | ICD-10-CM | POA: Diagnosis not present

## 2022-12-03 DIAGNOSIS — E785 Hyperlipidemia, unspecified: Secondary | ICD-10-CM | POA: Diagnosis not present

## 2022-12-03 DIAGNOSIS — B353 Tinea pedis: Secondary | ICD-10-CM | POA: Diagnosis not present

## 2022-12-03 DIAGNOSIS — J301 Allergic rhinitis due to pollen: Secondary | ICD-10-CM | POA: Diagnosis not present

## 2022-12-03 MED ORDER — FEXOFENADINE HCL 180 MG PO TABS
ORAL_TABLET | ORAL | 0 refills | Status: DC
  Filled 2023-03-07: qty 90, fill #0

## 2022-12-03 MED ORDER — APIXABAN 5 MG PO TABS
ORAL_TABLET | ORAL | 1 refills | Status: DC
  Filled 2022-12-03: qty 180, 90d supply, fill #0

## 2022-12-13 ENCOUNTER — Other Ambulatory Visit (HOSPITAL_COMMUNITY): Payer: Self-pay

## 2022-12-18 ENCOUNTER — Other Ambulatory Visit (HOSPITAL_COMMUNITY): Payer: Self-pay

## 2022-12-18 MED ORDER — AMOXICILLIN 500 MG PO CAPS
500.0000 mg | ORAL_CAPSULE | Freq: Three times a day (TID) | ORAL | 0 refills | Status: DC
  Filled 2022-12-18: qty 21, 7d supply, fill #0

## 2022-12-26 ENCOUNTER — Encounter: Payer: Self-pay | Admitting: Internal Medicine

## 2023-01-01 ENCOUNTER — Ambulatory Visit: Payer: Commercial Managed Care - PPO | Admitting: Family Medicine

## 2023-01-01 ENCOUNTER — Encounter: Payer: Self-pay | Admitting: Oncology

## 2023-01-08 ENCOUNTER — Other Ambulatory Visit (HOSPITAL_COMMUNITY): Payer: Self-pay

## 2023-01-08 ENCOUNTER — Ambulatory Visit (INDEPENDENT_AMBULATORY_CARE_PROVIDER_SITE_OTHER): Payer: 59 | Admitting: Family Medicine

## 2023-01-08 ENCOUNTER — Encounter: Payer: Self-pay | Admitting: Family Medicine

## 2023-01-08 VITALS — BP 116/82 | HR 59 | Temp 98.7°F | Ht 69.75 in | Wt 245.1 lb

## 2023-01-08 DIAGNOSIS — I4819 Other persistent atrial fibrillation: Secondary | ICD-10-CM | POA: Diagnosis not present

## 2023-01-08 DIAGNOSIS — I10 Essential (primary) hypertension: Secondary | ICD-10-CM

## 2023-01-08 MED ORDER — METOPROLOL SUCCINATE ER 25 MG PO TB24
25.0000 mg | ORAL_TABLET | Freq: Every day | ORAL | 1 refills | Status: DC
  Filled 2023-01-08: qty 90, 90d supply, fill #0
  Filled 2023-03-07 – 2023-04-11 (×2): qty 90, 90d supply, fill #1

## 2023-01-08 NOTE — Assessment & Plan Note (Signed)
BP is well controlled on the amlodipine/ benazepril 5/40 mg daily. I am adding toprol XL 25 mg daily for rate control only.
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
## 2023-01-08 NOTE — Assessment & Plan Note (Signed)
Confirmed again on EKG today in office. ChadsVasc score is 2. I recommended taking the Eliquis 5 mg BID. I have given him 2 months of samples of this medication and gave him paperwork for the patient assistance program to help get it at a reduced cost. I have placed a referral to cardiology for continued cardiac work up including ECHO and stress test. I will ask for his bloodwork from his last PCP Dr. Berdine Addison. Will add toprol XL 25 mg daily for rate control. Will see him back in 6 months after his cardiac work up is complete.

## 2023-01-11 ENCOUNTER — Other Ambulatory Visit (HOSPITAL_COMMUNITY): Payer: Self-pay

## 2023-01-25 ENCOUNTER — Other Ambulatory Visit (HOSPITAL_COMMUNITY): Payer: Self-pay

## 2023-03-07 ENCOUNTER — Other Ambulatory Visit (HOSPITAL_COMMUNITY): Payer: Self-pay

## 2023-03-11 ENCOUNTER — Other Ambulatory Visit (HOSPITAL_COMMUNITY): Payer: Self-pay

## 2023-03-11 ENCOUNTER — Encounter: Payer: Self-pay | Admitting: Oncology

## 2023-03-11 ENCOUNTER — Other Ambulatory Visit: Payer: Self-pay | Admitting: Family Medicine

## 2023-03-13 ENCOUNTER — Other Ambulatory Visit: Payer: Self-pay

## 2023-03-13 ENCOUNTER — Other Ambulatory Visit (HOSPITAL_COMMUNITY): Payer: Self-pay

## 2023-03-13 MED ORDER — HYDROCHLOROTHIAZIDE 12.5 MG PO CAPS
12.5000 mg | ORAL_CAPSULE | Freq: Every day | ORAL | 1 refills | Status: DC
  Filled 2023-03-13: qty 90, 90d supply, fill #0
  Filled 2023-06-13: qty 90, 90d supply, fill #1

## 2023-03-13 MED ORDER — AMLODIPINE BESY-BENAZEPRIL HCL 5-40 MG PO CAPS
1.0000 | ORAL_CAPSULE | Freq: Every day | ORAL | 1 refills | Status: DC
  Filled 2023-03-13: qty 90, 90d supply, fill #0
  Filled 2023-06-13: qty 90, 90d supply, fill #1

## 2023-03-13 NOTE — Telephone Encounter (Signed)
Ok to refill 

## 2023-03-14 ENCOUNTER — Other Ambulatory Visit: Payer: Self-pay

## 2023-03-19 ENCOUNTER — Other Ambulatory Visit (HOSPITAL_COMMUNITY): Payer: Self-pay

## 2023-06-12 ENCOUNTER — Encounter (INDEPENDENT_AMBULATORY_CARE_PROVIDER_SITE_OTHER): Payer: Self-pay

## 2023-06-13 ENCOUNTER — Other Ambulatory Visit (HOSPITAL_COMMUNITY): Payer: Self-pay

## 2023-06-14 ENCOUNTER — Other Ambulatory Visit: Payer: Self-pay

## 2023-07-09 ENCOUNTER — Ambulatory Visit: Payer: 59 | Admitting: Family Medicine

## 2023-08-05 ENCOUNTER — Other Ambulatory Visit: Payer: Self-pay | Admitting: Family Medicine

## 2023-08-05 DIAGNOSIS — I4819 Other persistent atrial fibrillation: Secondary | ICD-10-CM

## 2023-08-06 ENCOUNTER — Other Ambulatory Visit (HOSPITAL_COMMUNITY): Payer: Self-pay

## 2023-08-06 MED ORDER — METOPROLOL SUCCINATE ER 25 MG PO TB24
25.0000 mg | ORAL_TABLET | Freq: Every day | ORAL | 1 refills | Status: DC
  Filled 2023-08-06: qty 90, 90d supply, fill #0

## 2023-08-14 ENCOUNTER — Encounter: Payer: Self-pay | Admitting: Family Medicine

## 2023-08-14 ENCOUNTER — Other Ambulatory Visit (HOSPITAL_COMMUNITY): Payer: Self-pay

## 2023-08-14 ENCOUNTER — Ambulatory Visit (INDEPENDENT_AMBULATORY_CARE_PROVIDER_SITE_OTHER): Payer: 59 | Admitting: Family Medicine

## 2023-08-14 VITALS — BP 110/70 | HR 65 | Temp 98.3°F | Ht 69.75 in | Wt 253.5 lb

## 2023-08-14 DIAGNOSIS — I10 Essential (primary) hypertension: Secondary | ICD-10-CM | POA: Diagnosis not present

## 2023-08-14 DIAGNOSIS — Z23 Encounter for immunization: Secondary | ICD-10-CM | POA: Diagnosis not present

## 2023-08-14 DIAGNOSIS — Z13220 Encounter for screening for lipoid disorders: Secondary | ICD-10-CM | POA: Diagnosis not present

## 2023-08-14 DIAGNOSIS — S39012A Strain of muscle, fascia and tendon of lower back, initial encounter: Secondary | ICD-10-CM

## 2023-08-14 DIAGNOSIS — I4819 Other persistent atrial fibrillation: Secondary | ICD-10-CM | POA: Diagnosis not present

## 2023-08-14 LAB — COMPREHENSIVE METABOLIC PANEL
ALT: 12 U/L (ref 0–53)
AST: 15 U/L (ref 0–37)
Albumin: 4 g/dL (ref 3.5–5.2)
Alkaline Phosphatase: 57 U/L (ref 39–117)
BUN: 20 mg/dL (ref 6–23)
CO2: 25 meq/L (ref 19–32)
Calcium: 9.5 mg/dL (ref 8.4–10.5)
Chloride: 104 meq/L (ref 96–112)
Creatinine, Ser: 1.11 mg/dL (ref 0.40–1.50)
GFR: 67.02 mL/min (ref >60.00)
Glucose, Bld: 102 mg/dL — ABNORMAL HIGH (ref 70–99)
Potassium: 3.7 meq/L (ref 3.5–5.1)
Sodium: 137 meq/L (ref 135–145)
Total Bilirubin: 0.6 mg/dL (ref 0.2–1.2)
Total Protein: 7.6 g/dL (ref 6.0–8.3)

## 2023-08-14 LAB — LIPID PANEL
Cholesterol: 160 mg/dL (ref 0–200)
HDL: 45 mg/dL (ref >39.00)
LDL Cholesterol: 100 mg/dL — ABNORMAL HIGH (ref 0–99)
NonHDL: 115.33
Total CHOL/HDL Ratio: 4
Triglycerides: 79 mg/dL (ref 0.0–149.0)
VLDL: 15.8 mg/dL (ref 0.0–40.0)

## 2023-08-14 LAB — TSH: TSH: 2.57 u[IU]/mL (ref 0.35–5.50)

## 2023-08-14 LAB — CBC WITH DIFFERENTIAL/PLATELET
Basophils Absolute: 0.1 10*3/uL (ref 0.0–0.1)
Basophils Relative: 0.7 % (ref 0.0–3.0)
Eosinophils Absolute: 0.1 10*3/uL (ref 0.0–0.7)
Eosinophils Relative: 1.6 % (ref 0.0–5.0)
HCT: 46.1 % (ref 39.0–52.0)
Hemoglobin: 15.1 g/dL (ref 13.0–17.0)
Lymphocytes Relative: 26.9 % (ref 12.0–46.0)
Lymphs Abs: 2.1 10*3/uL (ref 0.7–4.0)
MCHC: 32.7 g/dL (ref 30.0–36.0)
MCV: 87.1 fL (ref 78.0–100.0)
Monocytes Absolute: 0.8 10*3/uL (ref 0.1–1.0)
Monocytes Relative: 9.9 % (ref 3.0–12.0)
Neutro Abs: 4.7 10*3/uL (ref 1.4–7.7)
Neutrophils Relative %: 60.9 % (ref 43.0–77.0)
Platelets: 102 10*3/uL — ABNORMAL LOW (ref 150.0–400.0)
RBC: 5.29 Mil/uL (ref 4.22–5.81)
RDW: 15 % (ref 11.5–15.5)
WBC: 7.7 10*3/uL (ref 4.0–10.5)

## 2023-08-14 MED ORDER — CYCLOBENZAPRINE HCL 5 MG PO TABS
5.0000 mg | ORAL_TABLET | Freq: Three times a day (TID) | ORAL | 0 refills | Status: DC | PRN
Start: 2023-08-14 — End: 2024-01-03
  Filled 2023-08-14: qty 30, 10d supply, fill #0

## 2023-08-14 NOTE — Assessment & Plan Note (Signed)
Pt does have an appt with cardiology in November. I advised he continue his medication as prescribed until he sees the Cardiologist. His HR appears to be controlled at this time and he denies any cardiac symptoms at this time. Patient is due for his annual bloodwork today. Checking CMP, CBC, lipid panel and TSH

## 2023-08-14 NOTE — Patient Instructions (Signed)
Tylenol is the recommend medicine for you to take for back pain-- please avoid ibuprofen and naproxen.

## 2023-08-14 NOTE — Assessment & Plan Note (Signed)
Current hypertension medications:       Sig   amLODipine-benazepril (LOTREL) 5-40 MG capsule (Taking) Take 1 capsule by mouth daily.   hydrochlorothiazide (MICROZIDE) 12.5 MG capsule (Taking) Take 1 capsule (12.5 mg total) by mouth daily.   metoprolol succinate (TOPROL-XL) 25 MG 24 hr tablet (Taking) Take 1 tablet (25 mg total) by mouth daily.   sildenafil (VIAGRA) 50 MG tablet (Taking) Take 1 tablet (50 mg total) by mouth daily as needed.      Chronic, stable on the above medications, will continue as prescribed.

## 2023-08-14 NOTE — Progress Notes (Signed)
Established Patient Office Visit  Subjective   Patient ID: Jermaine Moody, male    DOB: [birth year]  Age: 72 y.o. MRN: [id number]  Chief Complaint  Patient presents with   Medical Management of Chronic Issues   Spasms    Patient complains of back spasms x1 week, noticed after moving furniture    Pt is here for follow up.   Atrial fibrillation-- pt has his cardiology appointment set up in November. Patient reports no SOB, headaches or dizziness. He reports that he was a little nervous about participating in physical activity. Patient reports he is getting his Eliquis through the patient assistance program, they are mailing it to his home. He denies any obvious signs or symptoms of bleeding.   HTN -- BP in office performed and is well controlled. He  reports no side effects to the medications, no chest pain, SOB, dizziness or headaches. He has a BP cuff at home and is checking BP regularly, reports they are in the normal range.    Pt reports he was moving some furniture around a few days ago, states that it is his lower back has been spasming, hurts worse when he lays down at night, also with standing. He reports some pain radiating down the back of his right leg when he first injured his back, but this seems to have resolved.     Current Outpatient Medications  Medication Instructions   acetaminophen (TYLENOL) 1,000 mg, Oral, Every 6 hours PRN   amLODipine-benazepril (LOTREL) 5-40 MG capsule 1 capsule, Oral, Daily   apixaban (ELIQUIS) 5 mg, Oral, 2 times daily, Is getting it from the patient assistance program   cetirizine (ZYRTEC) 10 mg, Oral, Daily PRN   cyclobenzaprine (FLEXERIL) 5 mg, Oral, 3 times daily PRN   fexofenadine (ALLEGRA ALLERGY) 180 MG tablet ONE TABLET BY MOUTH DAILY FOR ALLERGIES   hydrochlorothiazide (MICROZIDE) 12.5 mg, Oral, Daily   metoprolol succinate (TOPROL-XL) 25 mg, Oral, Daily   sildenafil (VIAGRA) 50 mg, Oral, Daily PRN    Patient Active Problem  List   Diagnosis Date Noted   Persistent atrial fibrillation ([facility]) 01/08/2023   Port-A-Cath in place 12/04/2016   Cancer of descending colon s/p left robotic colectomy 11/14/2016 11/14/2016   Chronic hepatitis C without hepatic coma ([facility]) 09/06/2015   Chronic cholecystitis with calculus 07/21/2015   Preventative health care 01/12/2011   ERECTILE DYSFUNCTION, NON-ORGANIC 05/25/2009   Anxiety state 12/29/2007   Essential hypertension 12/29/2007   Allergic rhinitis 12/29/2007      Review of Systems  All other systems reviewed and are negative.     Objective:     BP 110/70 (BP Location: Left Arm, Patient Position: Sitting, Cuff Size: Large)   Pulse 65   Temp 98.3 F (36.8 C) (Oral)   Ht 5' 9.75" (1.772 m)   Wt 253 lb 8 oz (115 kg)   SpO2 99%   BMI 36.63 kg/m    Physical Exam Vitals reviewed.  Constitutional:      Appearance: Normal appearance. He is well-groomed and normal weight.  Neck:     Thyroid: No thyromegaly.  Cardiovascular:     Rate and Rhythm: Normal rate. Rhythm irregular.     Pulses: Normal pulses.     Heart sounds: S1 normal and S2 normal. No murmur heard. Pulmonary:     Effort: Pulmonary effort is normal.     Breath sounds: Normal breath sounds and air entry. No rales.  Musculoskeletal:     Right lower leg:  Edema (1+ pitting edema in the ankle) present.     Left lower leg: Edema (1+ edema pitting in the ankle) present.  Neurological:     General: No focal deficit present.     Mental Status: He is alert and oriented to person, place, and time. Mental status is at baseline.     Gait: Gait is intact.  Psychiatric:        Mood and Affect: Mood and affect normal.        Behavior: Behavior normal.      No results found for any visits on 08/14/23.    The ASCVD Risk score (Arnett DK, et al., 2019) failed to calculate for the following reasons:   Cannot find a previous HDL lab   Cannot find a previous total cholesterol lab    Assessment & Plan:   Need for immunization against influenza -     Flu Vaccine Trivalent High Dose (Fluad)  Essential hypertension Assessment & Plan: Current hypertension medications:       Sig   amLODipine-benazepril (LOTREL) 5-40 MG capsule (Taking) Take 1 capsule by mouth daily.   hydrochlorothiazide (MICROZIDE) 12.5 MG capsule (Taking) Take 1 capsule (12.5 mg total) by mouth daily.   metoprolol succinate (TOPROL-XL) 25 MG 24 hr tablet (Taking) Take 1 tablet (25 mg total) by mouth daily.   sildenafil (VIAGRA) 50 MG tablet (Taking) Take 1 tablet (50 mg total) by mouth daily as needed.      Chronic, stable on the above medications, will continue as prescribed.   Orders: -     Comprehensive metabolic panel -     TSH  Persistent atrial fibrillation [facility]) Assessment & Plan: Pt does have an appt with cardiology in November. I advised he continue his medication as prescribed until he sees the Cardiologist. His HR appears to be controlled at this time and he denies any cardiac symptoms at this time. Patient is due for his annual bloodwork today. Checking CMP, CBC, lipid panel and TSH  Orders: -     CBC with Differential/Platelet  Lipid screening -     Lipid panel  Strain of lumbar region, initial encounter Secondary to over-exertion, I recommended using heating pads, handouts given on stretching exercises for his back and I will rx a trial of muscle relaxers to be use PRN. Further work up/imaging is not indicated at this time. Pt was counseled to avoid NSAIDS as this can increase his risk of bleeding with the Eliquis.   -     Cyclobenzaprine HCl; Take 1 tablet (5 mg total) by mouth 3 (three) times daily as needed for muscle spasms.  Dispense: 30 tablet; Refill: 0     Return in about 6 months (around 02/12/2024).    [name], MD

## 2023-09-24 ENCOUNTER — Other Ambulatory Visit (HOSPITAL_COMMUNITY): Payer: Self-pay

## 2023-09-24 ENCOUNTER — Other Ambulatory Visit: Payer: Self-pay | Admitting: Cardiovascular Disease

## 2023-09-24 ENCOUNTER — Ambulatory Visit: Payer: 59 | Attending: Cardiovascular Disease | Admitting: Cardiovascular Disease

## 2023-09-24 ENCOUNTER — Telehealth: Payer: Self-pay | Admitting: *Deleted

## 2023-09-24 ENCOUNTER — Encounter: Payer: Self-pay | Admitting: Cardiovascular Disease

## 2023-09-24 VITALS — BP 126/98 | HR 121 | Ht 69.5 in | Wt 252.8 lb

## 2023-09-24 DIAGNOSIS — Z01810 Encounter for preprocedural cardiovascular examination: Secondary | ICD-10-CM | POA: Diagnosis not present

## 2023-09-24 DIAGNOSIS — R400 Somnolence: Secondary | ICD-10-CM | POA: Diagnosis not present

## 2023-09-24 DIAGNOSIS — I4819 Other persistent atrial fibrillation: Secondary | ICD-10-CM | POA: Diagnosis not present

## 2023-09-24 DIAGNOSIS — Z7689 Persons encountering health services in other specified circumstances: Secondary | ICD-10-CM | POA: Diagnosis not present

## 2023-09-24 MED ORDER — METOPROLOL SUCCINATE ER 50 MG PO TB24
50.0000 mg | ORAL_TABLET | Freq: Every day | ORAL | 3 refills | Status: DC
  Filled 2023-09-24: qty 90, 90d supply, fill #0

## 2023-09-24 NOTE — H&P (View-Only) (Signed)
 Cardiology Office Note:  .   Date:  09/24/2023  ID:  Jermaine Moody, DOB 01-Apr-1951, MRN [id number] PCP: [name], MD  [facility] Providers Cardiologist:  None    History of Present Illness: .   Jermaine Moody is a 72 y.o. male  with hx of atrial fib Was diagnosed by his primary MD   Was started on Eliquis  Is on low dose toprol XL 25 mg a day ,  HR is still elevated  TSH is normal at 2.57 No echo   Not getting much exercise       ROS:   Studies Reviewed: Marland Kitchen   EKG Interpretation Date/Time:  Tuesday September 24 2023 08:18:32 EST Ventricular Rate:  121 PR Interval:    QRS Duration:  92 QT Interval:  340 QTC Calculation: 482 R Axis:   -30  Text Interpretation: Atrial fibrillation with rapid ventricular response Left axis deviation Nonspecific ST and T wave abnormality When compared with ECG of 09-Nov-2016 08:57, Atrial fibrillation has replaced Sinus rhythm Vent. rate has increased BY  49 BPM Confirmed by Kristeen Miss ([id number]) on 09/24/2023 8:27:39 AM    Risk Assessment/Calculations:    CHA2DS2-VASc Score = 2   This indicates a 2.2% annual risk of stroke. The patient's score is based upon: CHF History: 0 HTN History: 1 Diabetes History: 0 Stroke History: 0 Vascular Disease History: 0 Age Score: 1 Gender Score: 0    STOP-Bang Score:  8      Physical Exam:   VS:  BP (!) 126/98   Pulse (!) 121   Ht 5' 9.5" (1.765 m)   Wt 252 lb 12.8 oz (114.7 kg)   SpO2 98%   BMI 36.80 kg/m    Wt Readings from Last 3 Encounters:  09/24/23 252 lb 12.8 oz (114.7 kg)  08/14/23 253 lb 8 oz (115 kg)  01/08/23 245 lb 1.6 oz (111.2 kg)    GEN: Well nourished, well developed in no acute distress NECK: No JVD; No carotid bruits CARDIAC: Irreg. Irreg.  RESPIRATORY:  Clear to auscultation without rales, wheezing or rhonchi  ABDOMEN: Soft, non-tender, non-distended EXTREMITIES:  No edema; No deformity   ASSESSMENT AND PLAN: .    Atrial fib with  RVR :   Patient was diagnosed with atrial fibrillation approximately a year ago.  He has been on Eliquis for about a year.  He was only taking it once a day for quite a while but has been on it twice a day for at least a month.  His TSH is normal.  I would like to get an echocardiogram for further evaluation of his chamber size and LV function.  Will go ahead and set him up for a cardioversion.  We discussed the risk, benefits, options of cardioversion.  He understands and agrees to proceed.  HR is 121.  Will increase his Toprol to 50 mg a day  Encouraged him to ambulate    Follow up with an APP in 2-3 months       Informed Consent   Shared Decision Making/Informed Consent{   The risks (stroke, cardiac arrhythmias rarely resulting in the need for a temporary or permanent pacemaker, skin irritation or burns and complications associated with conscious sedation including aspiration, arrhythmia, respiratory failure and death), benefits (restoration of normal sinus rhythm) and alternatives of a direct current cardioversion were explained in detail to [name] and he agrees to proceed.       Dispo: 2-3 months  with APP   Signed, Kristeen Miss, MD

## 2023-09-24 NOTE — Progress Notes (Signed)
Cardiology Office Note:  .   Date:  09/24/2023  ID:  Jermaine Moody, DOB 01-Apr-1951, MRN [id number] PCP: Jermaine Georges, MD  [facility] Providers Cardiologist:  None    History of Present Illness: .   Jermaine Moody is a 72 y.o. male  with hx of atrial fib Was diagnosed by his primary MD   Was started on Eliquis  Is on low dose toprol XL 25 mg a day ,  HR is still elevated  TSH is normal at 2.57 No echo   Not getting much exercise       ROS:   Studies Reviewed: Marland Kitchen   EKG Interpretation Date/Time:  Tuesday September 24 2023 08:18:32 EST Ventricular Rate:  121 PR Interval:    QRS Duration:  92 QT Interval:  340 QTC Calculation: 482 R Axis:   -30  Text Interpretation: Atrial fibrillation with rapid ventricular response Left axis deviation Nonspecific ST and T wave abnormality When compared with ECG of 09-Nov-2016 08:57, Atrial fibrillation has replaced Sinus rhythm Vent. rate has increased BY  49 BPM Confirmed by Kristeen Miss ([id number]) on 09/24/2023 8:27:39 AM    Risk Assessment/Calculations:    CHA2DS2-VASc Score = 2   This indicates a 2.2% annual risk of stroke. The patient's score is based upon: CHF History: 0 HTN History: 1 Diabetes History: 0 Stroke History: 0 Vascular Disease History: 0 Age Score: 1 Gender Score: 0    STOP-Bang Score:  8      Physical Exam:   VS:  BP (!) 126/98   Pulse (!) 121   Ht 5' 9.5" (1.765 m)   Wt 252 lb 12.8 oz (114.7 kg)   SpO2 98%   BMI 36.80 kg/m    Wt Readings from Last 3 Encounters:  09/24/23 252 lb 12.8 oz (114.7 kg)  08/14/23 253 lb 8 oz (115 kg)  01/08/23 245 lb 1.6 oz (111.2 kg)    GEN: Well nourished, well developed in no acute distress NECK: No JVD; No carotid bruits CARDIAC: Irreg. Irreg.  RESPIRATORY:  Clear to auscultation without rales, wheezing or rhonchi  ABDOMEN: Soft, non-tender, non-distended EXTREMITIES:  No edema; No deformity   ASSESSMENT AND PLAN: .    Atrial fib with  RVR :   Patient was diagnosed with atrial fibrillation approximately a year ago.  He has been on Eliquis for about a year.  He was only taking it once a day for quite a while but has been on it twice a day for at least a month.  His TSH is normal.  I would like to get an echocardiogram for further evaluation of his chamber size and LV function.  Will go ahead and set him up for a cardioversion.  We discussed the risk, benefits, options of cardioversion.  He understands and agrees to proceed.  HR is 121.  Will increase his Toprol to 50 mg a day  Encouraged him to ambulate    Follow up with an APP in 2-3 months       Informed Consent   Shared Decision Making/Informed Consent{   The risks (stroke, cardiac arrhythmias rarely resulting in the need for a temporary or permanent pacemaker, skin irritation or burns and complications associated with conscious sedation including aspiration, arrhythmia, respiratory failure and death), benefits (restoration of normal sinus rhythm) and alternatives of a direct current cardioversion were explained in detail to Mr. Jermaine Moody and he agrees to proceed.       Dispo: 2-3 months  with APP   Signed, Kristeen Miss, MD

## 2023-09-24 NOTE — Telephone Encounter (Signed)
DR. [name] ORDERED ITAMAR STUDY.   Patient agreement reviewed and signed on 09/24/2023.  WatchPAT issued to patient on 09/24/2023 by Danielle Rankin, CMA. Patient aware to not open the WatchPAT box until contacted with the activation PIN. Patient profile initialized in CloudPAT on 09/24/2023 by Rozelle Caudle. CMA. Device serial number: [id number]  Please list Reason for Call as Advice Only and type "WatchPAT issued to patient" in the comment box.

## 2023-09-24 NOTE — Patient Instructions (Signed)
Medication Instructions:  INCREASE Metoprolol Succinate/Toprol XL 50mg  daily *If you need a refill on your cardiac medications before your next appointment, please call your pharmacy*  Lab Work: CBC, BMET today If you have labs (blood work) drawn today and your tests are completely normal, you will receive your results only by: MyChart Message (if you have MyChart) OR A paper copy in the mail If you have any lab test that is abnormal or we need to change your treatment, we will call you to review the results.  Testing/Procedures: ECHO Your physician has requested that you have an echocardiogram. Echocardiography is a painless test that uses sound waves to create images of your heart. It provides your doctor with information about the size and shape of your heart and how well your heart's chambers and valves are working. This procedure takes approximately one hour. There are no restrictions for this procedure. Please do NOT wear cologne, perfume, aftershave, or lotions (deodorant is allowed). Please arrive 15 minutes prior to your appointment time.  Please note: We ask at that you not bring children with you during ultrasound (echo/ vascular) testing. Due to room size and safety concerns, children are not allowed in the ultrasound rooms during exams. Our front office staff cannot provide observation of children in our lobby area while testing is being conducted. An adult accompanying a patient to their appointment will only be allowed in the ultrasound room at the discretion of the ultrasound technician under special circumstances. We apologize for any inconvenience.  Cardioversion Your physician has recommended that you have a Cardioversion (DCCV). Electrical Cardioversion uses a jolt of electricity to your heart either through paddles or wired patches attached to your chest. This is a controlled, usually prescheduled, procedure. Defibrillation is done under light anesthesia in the hospital, and  you usually go home the day of the procedure. This is done to get your heart back into a normal rhythm. You are not awake for the procedure. Please see the instruction sheet given to you today.  Itamar sleep study Your physician has recommended that you have a sleep study. This test records several body functions during sleep, including: brain activity, eye movement, oxygen and carbon dioxide blood levels, heart rate and rhythm, breathing rate and rhythm, the flow of air through your mouth and nose, snoring, body muscle movements, and chest and belly movement.  Follow-Up: At [facility], you and your health needs are our priority.  As part of our continuing mission to provide you with exceptional heart care, we have created designated Provider Care Teams.  These Care Teams include your primary Cardiologist (physician) and Advanced Practice Providers (APPs -  Physician Assistants and Nurse Practitioners) who all work together to provide you with the care you need, when you need it.  Your next appointment:   3 month(s)  Provider:   Dorcas Carrow, or Williams     Other Instructions     Dear Jermaine Moody  You are scheduled for a Cardioversion on Wednesday, November 20 with Dr. Rennis Golden.  Please arrive at the [facility] (Main Entrance A) at [facility]: [address] [place], [place] [address] at 9:00 AM (This time is one hour(s) before your procedure to ensure your preparation). Free valet parking service is available. You will check in at ADMITTING. The support person will be asked to wait in the waiting room.  It is OK to have someone drop you off and come back when you are  ready to be discharged.      DIET:  Nothing to eat or drink after midnight except a sip of water with medications (see medication instructions below)  MEDICATION INSTRUCTIONS: !!IF ANY NEW MEDICATIONS ARE STARTED AFTER TODAY, PLEASE NOTIFY YOUR PROVIDER AS SOON AS POSSIBLE!!   FYI: Medications such as Semaglutide (Ozempic, [place]), Tirzepatide (Mounjaro, Zepbound), Dulaglutide (Trulicity), etc ("GLP1 agonists") AND Canagliflozin (Invokana), Dapagliflozin (Farxiga), Empagliflozin (Jardiance), Ertugliflozin (Steglatro), Bexagliflozin [employer]) or any combination with one of these drugs such as Invokamet (Canagliflozin/Metformin), Synjardy (Empagliflozin/Metformin), etc ("SGLT2 inhibitors") must be held around the time of a procedure. This is not a comprehensive list of all of these drugs. Please review all of your medications and talk to your provider if you take any one of these. If you are not sure, ask your provider.   HOLD Hydrochlorothiazide the morning of procedure  Continue taking your anticoagulant (blood thinner): Apixaban (Eliquis).  You will need to continue this after your procedure until you are told by your provider that it is safe to stop.    LABS: Today  FYI:  You must have a responsible person to drive you home and stay in the waiting area during your procedure. Failure to do so could result in cancellation.  Bring your insurance cards.  *Special Note: Every effort is made to have your procedure done on time. Occasionally there are emergencies that occur at the hospital that may cause delays. Please be patient if a delay does occur.

## 2023-09-25 LAB — CBC
Hematocrit: 47 % (ref 37.5–51.0)
Hemoglobin: 15.2 g/dL (ref 13.0–17.7)
MCH: 28.2 pg (ref 26.6–33.0)
MCHC: 32.3 g/dL (ref 31.5–35.7)
MCV: 87 fL (ref 79–97)
Platelets: 111 10*3/uL — ABNORMAL LOW (ref 150–450)
RBC: 5.39 x10E6/uL (ref 4.14–5.80)
RDW: 14.4 % (ref 11.6–15.4)
WBC: 8.7 10*3/uL (ref 3.4–10.8)

## 2023-09-25 LAB — BASIC METABOLIC PANEL
BUN/Creatinine Ratio: 19 (ref 10–24)
BUN: 22 mg/dL (ref 8–27)
CO2: 21 mmol/L (ref 20–29)
Calcium: 9.3 mg/dL (ref 8.6–10.2)
Chloride: 104 mmol/L (ref 96–106)
Creatinine, Ser: 1.15 mg/dL (ref 0.76–1.27)
Glucose: 90 mg/dL (ref 70–99)
Potassium: 4.2 mmol/L (ref 3.5–5.2)
Sodium: 140 mmol/L (ref 134–144)
eGFR: 68 mL/min/{1.73_m2} (ref >59)

## 2023-10-01 NOTE — Progress Notes (Signed)
Spoke to pt and instructed them to come at 0830 and to be NPO after 0000.  Confirmed no missed doses of AC and instructed to take in AM with a small sip of water.  Confirmed that pt will have a ride home and someone to stay with them for 24 hours after the procedure. Instructed patient to not wear any jewelry or lotion.

## 2023-10-02 ENCOUNTER — Ambulatory Visit (HOSPITAL_COMMUNITY): Payer: 59

## 2023-10-02 ENCOUNTER — Encounter (HOSPITAL_COMMUNITY)
Admission: RE | Disposition: A | Discharge status: Home / Self Care | Payer: Self-pay | Source: Home / Self Care | Attending: Internal Medicine

## 2023-10-02 ENCOUNTER — Ambulatory Visit (HOSPITAL_COMMUNITY)
Admission: RE | Admit: 2023-10-02 | Discharge: 2023-10-02 | Disposition: A | Discharge status: Home / Self Care | Payer: 59 | Attending: Internal Medicine | Admitting: Internal Medicine

## 2023-10-02 ENCOUNTER — Other Ambulatory Visit: Payer: Self-pay

## 2023-10-02 DIAGNOSIS — Z87891 Personal history of nicotine dependence: Secondary | ICD-10-CM | POA: Diagnosis not present

## 2023-10-02 DIAGNOSIS — I4891 Unspecified atrial fibrillation: Secondary | ICD-10-CM | POA: Insufficient documentation

## 2023-10-02 DIAGNOSIS — I10 Essential (primary) hypertension: Secondary | ICD-10-CM | POA: Diagnosis not present

## 2023-10-02 DIAGNOSIS — Z7901 Long term (current) use of anticoagulants: Secondary | ICD-10-CM | POA: Insufficient documentation

## 2023-10-02 DIAGNOSIS — I4819 Other persistent atrial fibrillation: Secondary | ICD-10-CM | POA: Diagnosis not present

## 2023-10-02 DIAGNOSIS — E785 Hyperlipidemia, unspecified: Secondary | ICD-10-CM | POA: Diagnosis not present

## 2023-10-02 DIAGNOSIS — N189 Chronic kidney disease, unspecified: Secondary | ICD-10-CM | POA: Diagnosis not present

## 2023-10-02 DIAGNOSIS — I129 Hypertensive chronic kidney disease with stage 1 through stage 4 chronic kidney disease, or unspecified chronic kidney disease: Secondary | ICD-10-CM | POA: Diagnosis not present

## 2023-10-02 HISTORY — PX: CARDIOVERSION: EP1203

## 2023-10-02 SURGERY — CARDIOVERSION (CATH LAB)
Anesthesia: General

## 2023-10-02 SURGICAL SUPPLY — 1 items: PAD DEFIB RADIO PHYSIO CONN (PAD) ×1 IMPLANT

## 2023-10-02 NOTE — Interval H&P Note (Signed)
History and Physical Interval Note:  10/02/2023 8:59 AM  Jermaine Moody  has presented today for surgery, with the diagnosis of AFIB.  The various methods of treatment have been discussed with the patient and family. After consideration of risks, benefits and other options for treatment, the patient has consented to  Procedure(s): CARDIOVERSION (CATH LAB) (N/A) as a surgical intervention.  The patient's history has been reviewed, patient examined, no change in status, stable for surgery.  I have reviewed the patient's chart and labs.  Questions were answered to the patient's satisfaction.     [name]

## 2023-10-02 NOTE — CV Procedure (Signed)
    CARDIOVERSION NOTE  Procedure: Electrical Cardioversion Indications:  Atrial Fibrillation  Procedure Details:  Consent: Risks of procedure as well as the alternatives and risks of each were explained to the (patient/caregiver).  Consent for procedure obtained.  Time Out: Verified patient identification, verified procedure, site/side was marked, verified correct patient position, special equipment/implants available, medications/allergies/relevent history reviewed, required imaging and test results available.  Performed  Patient placed on cardiac monitor, pulse oximetry, supplemental oxygen as necessary.  Sedation given:  propofol per anesthesia Pacer pads placed anterior and posterior chest.  Cardioverted 3 time(s).  Cardioverted at 200J, 300J and 360J biphasic.  Impression: Findings: Post procedure EKG shows: Atrial Fibrillation Complications: None Patient did tolerate procedure well.  Plan: Ultimately unsuccessful DCCV after 3 stacked shocks at 200J, 300J and 360J - he did convert easily to sinus rhythm after each shock, but the rhythm deteriorated back to afib within 30 seconds after being in sinus. Would recommend follow-up echo for LV function and consider appropriate AAD therapy and consider repeat DCCV attempt in the future.  Time Spent Directly with the Patient:  30 minutes   [name], MD, [facility], FACP  [place]  [facility]  Medical Director of the Advanced Lipid Disorders &  Cardiovascular Risk Reduction Clinic Diplomate of the American Board of Clinical Lipidology Attending Cardiologist  Direct Dial: [phone]  Fax: [phone]  Website:  [email or web address] Nicely Virjean Boman 10/02/2023, 9:29 AM

## 2023-10-02 NOTE — Anesthesia Preprocedure Evaluation (Addendum)
Anesthesia Evaluation  Patient identified by MRN, date of birth, ID band Patient awake    Reviewed: Allergy & Precautions, H&P , NPO status , Patient's Chart, lab work & pertinent test results, reviewed documented beta blocker date and time   Airway Mallampati: II  TM Distance: >3 FB Neck ROM: Full    Dental no notable dental hx. (+) Teeth Intact, Dental Advisory Given   Pulmonary former smoker   Pulmonary exam normal breath sounds clear to auscultation       Cardiovascular hypertension, Pt. on medications and Pt. on home beta blockers + dysrhythmias Atrial Fibrillation  Rhythm:Regular Rate:Normal     Neuro/Psych   Anxiety     negative neurological ROS     GI/Hepatic negative GI ROS, Neg liver ROS,,,  Endo/Other  negative endocrine ROS    Renal/GU Renal disease  negative genitourinary   Musculoskeletal   Abdominal   Peds  Hematology negative hematology ROS (+)   Anesthesia Other Findings   Reproductive/Obstetrics negative OB ROS                             Anesthesia Physical Anesthesia Plan  ASA: 3  Anesthesia Plan: General   Post-op Pain Management: Minimal or no pain anticipated   Induction: Intravenous  PONV Risk Score and Plan: 2 and Treatment may vary due to age or medical condition  Airway Management Planned: Mask  Additional Equipment:   Intra-op Plan:   Post-operative Plan:   Informed Consent: I have reviewed the patients History and Physical, chart, labs and discussed the procedure including the risks, benefits and alternatives for the proposed anesthesia with the patient or authorized representative who has indicated his/her understanding and acceptance.     Dental advisory given  Plan Discussed with: CRNA  Anesthesia Plan Comments:        Anesthesia Quick Evaluation

## 2023-10-02 NOTE — Anesthesia Postprocedure Evaluation (Signed)
Anesthesia Post Note  Patient: Jermaine Moody  Procedure(s) Performed: CARDIOVERSION (CATH LAB)     Patient location during evaluation: Cath Lab Anesthesia Type: General Level of consciousness: awake and alert Pain management: pain level controlled Vital Signs Assessment: post-procedure vital signs reviewed and stable Respiratory status: spontaneous breathing, nonlabored ventilation and respiratory function stable Cardiovascular status: blood pressure returned to baseline and stable Postop Assessment: no apparent nausea or vomiting Anesthetic complications: no  No notable events documented.  Last Vitals:  Vitals:   10/02/23 0940 10/02/23 0945  BP: 119/80 105/67  Pulse: (!) 102 91  Resp: 16 19  Temp:    SpO2: 98% 98%    Last Pain:  Vitals:   10/02/23 0945  TempSrc:   PainSc: 0-No pain                 Kensleigh Gates,W. EDMOND

## 2023-10-02 NOTE — Transfer of Care (Signed)
Immediate Anesthesia Transfer of Care Note  Patient: Jermaine Moody  Procedure(s) Performed: CARDIOVERSION (CATH LAB)  Patient Location: PACU  Anesthesia Type:MAC  Level of Consciousness: awake, alert , and oriented  Airway & Oxygen Therapy: Patient Spontanous Breathing and Patient connected to face mask oxygen  Post-op Assessment: Report given to RN and Post -op Vital signs reviewed and stable  Post vital signs: Reviewed and stable  Last Vitals:  Vitals Value Taken Time  BP    Temp    Pulse    Resp 19 10/02/23 0914  SpO2    Vitals shown include unfiled device data.  Last Pain:  Vitals:   10/02/23 0829  TempSrc: Temporal         Complications: No notable events documented.

## 2023-10-03 ENCOUNTER — Encounter (HOSPITAL_COMMUNITY): Payer: Self-pay | Admitting: Internal Medicine

## 2023-10-08 ENCOUNTER — Other Ambulatory Visit (HOSPITAL_COMMUNITY): Payer: Self-pay

## 2023-10-08 MED ORDER — AMOXICILLIN 500 MG PO CAPS
500.0000 mg | ORAL_CAPSULE | Freq: Three times a day (TID) | ORAL | 0 refills | Status: DC
  Filled 2023-10-08: qty 21, 7d supply, fill #0

## 2023-10-16 ENCOUNTER — Other Ambulatory Visit: Payer: Self-pay | Admitting: Family Medicine

## 2023-10-17 ENCOUNTER — Other Ambulatory Visit (HOSPITAL_COMMUNITY): Payer: Self-pay

## 2023-10-17 MED ORDER — AMLODIPINE BESY-BENAZEPRIL HCL 5-40 MG PO CAPS
1.0000 | ORAL_CAPSULE | Freq: Every day | ORAL | 1 refills | Status: DC
  Filled 2023-10-17: qty 90, 90d supply, fill #0
  Filled 2023-12-26 – 2023-12-28 (×3): qty 90, 90d supply, fill #1

## 2023-10-21 ENCOUNTER — Ambulatory Visit (HOSPITAL_COMMUNITY): Payer: 59 | Attending: Cardiology

## 2023-10-21 DIAGNOSIS — R400 Somnolence: Secondary | ICD-10-CM | POA: Diagnosis not present

## 2023-10-21 DIAGNOSIS — I4819 Other persistent atrial fibrillation: Secondary | ICD-10-CM | POA: Diagnosis not present

## 2023-10-21 DIAGNOSIS — Z01810 Encounter for preprocedural cardiovascular examination: Secondary | ICD-10-CM | POA: Insufficient documentation

## 2023-10-21 LAB — ECHOCARDIOGRAM COMPLETE
Calc EF: 40.3 %
S' Lateral: 3.36 cm
Single Plane A2C EF: 39.8 %
Single Plane A4C EF: 40.4 %

## 2023-10-22 ENCOUNTER — Telehealth: Payer: Self-pay

## 2023-10-22 DIAGNOSIS — I4819 Other persistent atrial fibrillation: Secondary | ICD-10-CM

## 2023-10-22 NOTE — Telephone Encounter (Signed)
[name], Jermaine Ping, MD sent to Lars Mage, RN LV function is mildly reduced.   Unable to assess LV diastolic function Mild LAE Moderate RAE Trivial MR Pt had an unsuccessful DC cardioversion ( would convert for 30 seconds and then degenerate back into atrial fib ) Please refer him to the aFib clinic  Referral placed to A-fib clinic at this time. Patient has viewed comments and recommendation via MyChart.

## 2023-10-29 ENCOUNTER — Other Ambulatory Visit: Payer: Self-pay

## 2023-10-29 ENCOUNTER — Other Ambulatory Visit (HOSPITAL_COMMUNITY): Payer: Self-pay

## 2023-10-29 ENCOUNTER — Ambulatory Visit (HOSPITAL_COMMUNITY)
Admission: RE | Admit: 2023-10-29 | Discharge: 2023-10-29 | Disposition: A | Discharge status: Home / Self Care | Payer: 59 | Source: Ambulatory Visit | Attending: Physician Assistant | Admitting: Physician Assistant

## 2023-10-29 ENCOUNTER — Encounter (HOSPITAL_COMMUNITY): Payer: Self-pay | Admitting: Physician Assistant

## 2023-10-29 VITALS — BP 136/90 | HR 107 | Ht 69.0 in | Wt 250.8 lb

## 2023-10-29 DIAGNOSIS — I5022 Chronic systolic (congestive) heart failure: Secondary | ICD-10-CM | POA: Diagnosis not present

## 2023-10-29 DIAGNOSIS — N189 Chronic kidney disease, unspecified: Secondary | ICD-10-CM | POA: Insufficient documentation

## 2023-10-29 DIAGNOSIS — E785 Hyperlipidemia, unspecified: Secondary | ICD-10-CM | POA: Diagnosis not present

## 2023-10-29 DIAGNOSIS — R9431 Abnormal electrocardiogram [ECG] [EKG]: Secondary | ICD-10-CM | POA: Diagnosis not present

## 2023-10-29 DIAGNOSIS — Z7901 Long term (current) use of anticoagulants: Secondary | ICD-10-CM | POA: Insufficient documentation

## 2023-10-29 DIAGNOSIS — I4819 Other persistent atrial fibrillation: Secondary | ICD-10-CM | POA: Diagnosis not present

## 2023-10-29 DIAGNOSIS — D6869 Other thrombophilia: Secondary | ICD-10-CM | POA: Diagnosis not present

## 2023-10-29 DIAGNOSIS — I130 Hypertensive heart and chronic kidney disease with heart failure and stage 1 through stage 4 chronic kidney disease, or unspecified chronic kidney disease: Secondary | ICD-10-CM | POA: Diagnosis not present

## 2023-10-29 LAB — MAGNESIUM: Magnesium: 2.3 mg/dL (ref 1.7–2.4)

## 2023-10-29 MED ORDER — METOPROLOL SUCCINATE ER 25 MG PO TB24
25.0000 mg | ORAL_TABLET | Freq: Every day | ORAL | 1 refills | Status: DC
  Filled 2023-10-29: qty 90, 90d supply, fill #0
  Filled 2024-02-21: qty 90, 90d supply, fill #1

## 2023-10-29 MED ORDER — METOPROLOL SUCCINATE ER 50 MG PO TB24
50.0000 mg | ORAL_TABLET | Freq: Every day | ORAL | 3 refills | Status: DC
  Filled 2023-10-29 – 2023-12-20 (×2): qty 90, 90d supply, fill #0
  Filled 2024-03-20: qty 90, 90d supply, fill #1
  Filled 2024-06-19: qty 90, 90d supply, fill #2
  Filled 2024-09-16: qty 90, 90d supply, fill #3

## 2023-10-29 NOTE — Patient Instructions (Addendum)
On Friday, February 14th - STOP hydrochlorothiazide    If you are started on new medications after today - let Kennyth Arnold RN know about them [phone]
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
## 2023-11-07 ENCOUNTER — Telehealth: Payer: Self-pay

## 2023-11-07 NOTE — Telephone Encounter (Signed)
Spoke with Jermaine Moody on 12/16 regarding his PAP on BMS Elequis pt ask to mail application,mailed on 10/28/23 and fax Dr office portion on 10/28/23.

## 2023-11-14 ENCOUNTER — Encounter: Payer: Self-pay | Admitting: Oncology

## 2023-11-15 ENCOUNTER — Telehealth (HOSPITAL_COMMUNITY): Payer: Self-pay

## 2023-11-15 NOTE — Telephone Encounter (Signed)
 Following up on pt PAP for BMS Elequis spoke with pt explain he took PAP and drop then off at dr office.

## 2023-11-15 NOTE — Telephone Encounter (Signed)
 Contacted [employer] to initiate prior authorization for Tikosyn admission.  Authorization is pending for approval Date of service: 12/30/2023 Faxed clinical information to [employer]. Reference # [id number] Fax # [phone]

## 2023-11-19 NOTE — Telephone Encounter (Signed)
 Authorization for Hospitalization on 12/30/23 approved Ref Number [id number]

## 2023-11-27 ENCOUNTER — Telehealth: Payer: Self-pay

## 2023-11-27 ENCOUNTER — Telehealth: Payer: Self-pay | Admitting: Cardiovascular Disease

## 2023-11-27 NOTE — Progress Notes (Signed)
   11/27/2023  Patient ID: Jermaine Moody, male   DOB: 13-Jan-1951, 73 y.o.   MRN: [id number]  Contacted patient to follow up on Eliquis  assistance needs. Patient states he is completing the application through his cardiology office and they are handling for him.  Instructed patient to give us  a call if we could assist in anyway.  [name], PharmD Clinical Pharmacist [phone]

## 2023-11-27 NOTE — Telephone Encounter (Signed)
 Tried to return call. Phone just rings x3

## 2023-11-27 NOTE — Telephone Encounter (Signed)
 Pt c/o medication issue:  1. Name of Medication: apixaban  (ELIQUIS ) 5 MG TABS tablet   2. How are you currently taking this medication (dosage and times per day)?    3. Are you having a reaction (difficulty breathing--STAT)? no  4. What is your medication issue? Calling about the paperwork for patients assistant program. Please advise

## 2023-12-04 NOTE — Telephone Encounter (Signed)
Attempted to reach patient again. No answer and went straight to voicemail. Left message advising that I don't have paperwork for him and to call back if still needing assistance.

## 2023-12-09 ENCOUNTER — Telehealth: Payer: Self-pay | Admitting: Pharmacist

## 2023-12-09 NOTE — Telephone Encounter (Signed)
Medication list reviewed in anticipation of upcoming Tikosyn initiation. Patient is on hydrochlorothiazide which would be inappropriate to use with Tikosyn. D/C hydrochlorothiazide if BP remain elevated can consider increasing amlodipine dose or changing metoprolol to carvedilol   Patient is anticoagulated on Eliquis  on the appropriate dose. Please ensure that patient has not missed any anticoagulation doses in the 3 weeks prior to Tikosyn initiation.   Patient will need to be counseled to avoid use of Benadryl while on Tikosyn and in the 2-3 days prior to Tikosyn initiation.

## 2023-12-09 NOTE — Telephone Encounter (Signed)
-----   Message from Nurse Stacy C sent at 10/29/2023  9:29 AM EST ----- Regarding: tikosyn Pt for tikosyn please review meds thanks stacy

## 2023-12-20 ENCOUNTER — Other Ambulatory Visit: Payer: Self-pay

## 2023-12-20 ENCOUNTER — Other Ambulatory Visit: Payer: Self-pay | Admitting: Family Medicine

## 2023-12-20 ENCOUNTER — Encounter: Payer: Self-pay | Admitting: Oncology

## 2023-12-20 ENCOUNTER — Other Ambulatory Visit (HOSPITAL_COMMUNITY): Payer: Self-pay

## 2023-12-20 MED ORDER — HYDROCHLOROTHIAZIDE 12.5 MG PO CAPS
12.5000 mg | ORAL_CAPSULE | Freq: Every day | ORAL | 1 refills | Status: DC
  Filled 2023-12-20: qty 90, 90d supply, fill #0
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
## 2023-12-26 ENCOUNTER — Ambulatory Visit: Payer: 59 | Admitting: Nurse Practitioner

## 2023-12-26 DIAGNOSIS — I10 Essential (primary) hypertension: Secondary | ICD-10-CM

## 2023-12-26 DIAGNOSIS — I4819 Other persistent atrial fibrillation: Secondary | ICD-10-CM

## 2023-12-26 DIAGNOSIS — I5022 Chronic systolic (congestive) heart failure: Secondary | ICD-10-CM

## 2023-12-26 DIAGNOSIS — E785 Hyperlipidemia, unspecified: Secondary | ICD-10-CM

## 2023-12-27 ENCOUNTER — Other Ambulatory Visit (HOSPITAL_COMMUNITY): Payer: Self-pay

## 2023-12-27 ENCOUNTER — Encounter (HOSPITAL_COMMUNITY): Payer: Self-pay

## 2023-12-28 ENCOUNTER — Other Ambulatory Visit (HOSPITAL_COMMUNITY): Payer: Self-pay

## 2023-12-30 ENCOUNTER — Inpatient Hospital Stay (HOSPITAL_COMMUNITY)
Admission: AD | Admit: 2023-12-30 | Discharge: 2024-01-03 | DRG: 309 | Disposition: A | Discharge status: Home / Self Care | Payer: 59 | Source: Ambulatory Visit | Attending: Cardiovascular Disease | Admitting: Cardiovascular Disease

## 2023-12-30 ENCOUNTER — Ambulatory Visit (HOSPITAL_COMMUNITY)
Admission: RE | Admit: 2023-12-30 | Discharge: 2023-12-30 | Disposition: A | Discharge status: Home / Self Care | Payer: 59 | Source: Ambulatory Visit | Attending: Physician Assistant | Admitting: Physician Assistant

## 2023-12-30 ENCOUNTER — Telehealth (HOSPITAL_COMMUNITY): Payer: Self-pay | Admitting: Pharmacy Technician

## 2023-12-30 ENCOUNTER — Other Ambulatory Visit: Payer: Self-pay

## 2023-12-30 ENCOUNTER — Encounter (HOSPITAL_COMMUNITY): Payer: Self-pay | Admitting: Physician Assistant

## 2023-12-30 ENCOUNTER — Other Ambulatory Visit (HOSPITAL_COMMUNITY): Payer: Self-pay

## 2023-12-30 VITALS — BP 124/94 | HR 104 | Ht 69.0 in | Wt 260.0 lb

## 2023-12-30 DIAGNOSIS — N189 Chronic kidney disease, unspecified: Secondary | ICD-10-CM | POA: Insufficient documentation

## 2023-12-30 DIAGNOSIS — I4819 Other persistent atrial fibrillation: Secondary | ICD-10-CM | POA: Diagnosis not present

## 2023-12-30 DIAGNOSIS — Z7901 Long term (current) use of anticoagulants: Secondary | ICD-10-CM | POA: Diagnosis not present

## 2023-12-30 DIAGNOSIS — Z79899 Other long term (current) drug therapy: Secondary | ICD-10-CM

## 2023-12-30 DIAGNOSIS — I130 Hypertensive heart and chronic kidney disease with heart failure and stage 1 through stage 4 chronic kidney disease, or unspecified chronic kidney disease: Secondary | ICD-10-CM | POA: Insufficient documentation

## 2023-12-30 DIAGNOSIS — D6869 Other thrombophilia: Secondary | ICD-10-CM | POA: Diagnosis not present

## 2023-12-30 DIAGNOSIS — I493 Ventricular premature depolarization: Secondary | ICD-10-CM | POA: Diagnosis not present

## 2023-12-30 DIAGNOSIS — E785 Hyperlipidemia, unspecified: Secondary | ICD-10-CM | POA: Diagnosis not present

## 2023-12-30 DIAGNOSIS — I5022 Chronic systolic (congestive) heart failure: Secondary | ICD-10-CM | POA: Diagnosis not present

## 2023-12-30 DIAGNOSIS — I110 Hypertensive heart disease with heart failure: Secondary | ICD-10-CM | POA: Diagnosis not present

## 2023-12-30 DIAGNOSIS — R110 Nausea: Secondary | ICD-10-CM | POA: Diagnosis not present

## 2023-12-30 DIAGNOSIS — Z23 Encounter for immunization: Secondary | ICD-10-CM | POA: Diagnosis not present

## 2023-12-30 DIAGNOSIS — Z85038 Personal history of other malignant neoplasm of large intestine: Secondary | ICD-10-CM | POA: Diagnosis not present

## 2023-12-30 DIAGNOSIS — Z87442 Personal history of urinary calculi: Secondary | ICD-10-CM

## 2023-12-30 LAB — BASIC METABOLIC PANEL
Anion gap: 8 (ref 5–15)
BUN: 18 mg/dL (ref 8–23)
CO2: 22 mmol/L (ref 22–32)
Calcium: 8.9 mg/dL (ref 8.9–10.3)
Chloride: 110 mmol/L (ref 98–111)
Creatinine, Ser: 1.22 mg/dL (ref 0.61–1.24)
GFR, Estimated: >60 mL/min (ref >60)
Glucose, Bld: 94 mg/dL (ref 70–99)
Potassium: 4.4 mmol/L (ref 3.5–5.1)
Sodium: 140 mmol/L (ref 135–145)

## 2023-12-30 LAB — MAGNESIUM: Magnesium: 2.3 mg/dL (ref 1.7–2.4)

## 2023-12-30 MED ORDER — METOPROLOL SUCCINATE ER 50 MG PO TB24
75.0000 mg | ORAL_TABLET | Freq: Every day | ORAL | Status: DC
  Administered 2023-12-31 – 2024-01-03 (×4): 75 mg via ORAL
  Filled 2023-12-30 (×4): qty 1

## 2023-12-30 MED ORDER — SODIUM CHLORIDE 0.9% FLUSH
3.0000 mL | Freq: Two times a day (BID) | INTRAVENOUS | Status: DC
  Administered 2023-12-30 – 2024-01-02 (×4): 3 mL via INTRAVENOUS

## 2023-12-30 MED ORDER — BENAZEPRIL HCL 20 MG PO TABS
40.0000 mg | ORAL_TABLET | Freq: Every day | ORAL | Status: DC
  Administered 2023-12-31 – 2024-01-03 (×4): 40 mg via ORAL
  Filled 2023-12-30 (×4): qty 2

## 2023-12-30 MED ORDER — METOPROLOL SUCCINATE ER 25 MG PO TB24: 25.0000 mg | ORAL_TABLET | Freq: Every day | ORAL | Status: DC

## 2023-12-30 MED ORDER — SODIUM CHLORIDE 0.9% FLUSH: 3.0000 mL | INTRAVENOUS | Status: DC | PRN

## 2023-12-30 MED ORDER — AMLODIPINE BESYLATE 5 MG PO TABS
5.0000 mg | ORAL_TABLET | Freq: Every day | ORAL | Status: DC
  Administered 2023-12-31 – 2024-01-03 (×4): 5 mg via ORAL
  Filled 2023-12-30: qty 1
  Filled 2023-12-30: qty 2
  Filled 2023-12-30 (×2): qty 1

## 2023-12-30 MED ORDER — ACETAMINOPHEN 500 MG PO TABS
1000.0000 mg | ORAL_TABLET | Freq: Four times a day (QID) | ORAL | Status: DC | PRN
  Administered 2024-01-01 – 2024-01-03 (×2): 1000 mg via ORAL
  Filled 2023-12-30 (×2): qty 2

## 2023-12-30 MED ORDER — APIXABAN 5 MG PO TABS
5.0000 mg | ORAL_TABLET | Freq: Two times a day (BID) | ORAL | Status: DC
  Administered 2023-12-30 – 2024-01-03 (×8): 5 mg via ORAL
  Filled 2023-12-30 (×7): qty 1

## 2023-12-30 MED ORDER — DOFETILIDE 500 MCG PO CAPS
500.0000 ug | ORAL_CAPSULE | Freq: Two times a day (BID) | ORAL | Status: DC
  Administered 2023-12-30 – 2023-12-31 (×2): 500 ug via ORAL
  Filled 2023-12-30 (×2): qty 1

## 2023-12-30 MED ORDER — METOPROLOL SUCCINATE ER 50 MG PO TB24: 50.0000 mg | ORAL_TABLET | Freq: Every day | ORAL | Status: DC

## 2023-12-30 MED ORDER — CYCLOBENZAPRINE HCL 10 MG PO TABS: 5.0000 mg | ORAL_TABLET | Freq: Three times a day (TID) | ORAL | Status: DC | PRN

## 2023-12-30 MED ORDER — SODIUM CHLORIDE 0.9 % IV SOLN: 250.0000 mL | INTRAVENOUS | Status: AC | PRN

## 2023-12-30 NOTE — Progress Notes (Addendum)
Pharmacy: Dofetilide (Tikosyn) - Initial Consult Assessment and Electrolyte Replacement  Pharmacy consulted to assist in monitoring and replacing electrolytes in this 73 y.o. male admitted on 12/30/2023 undergoing dofetilide initiation. First dofetilide dose: 2/17@2000 .  Assessment:  Patient Exclusion Criteria: If any screening criteria checked as "Yes", then  patient  should NOT receive dofetilide until criteria item is corrected.  If "Yes" please indicate correction plan.  YES  NO Patient  Exclusion Criteria Correction Plan   [x]   []   Baseline QTc interval is greater than or equal to 440 msec. IF above YES box checked dofetilide contraindicated unless patient has ICD; then may proceed if QTc 500-550 msec or with known ventricular conduction abnormalities may proceed with QTc 550-600 msec. QTc = 467 (in Afib) EP aware - in NSR 451; okay to proceed   []   [x]   Patient is known or suspected to have a digoxin level greater than 2 ng/ml: No results found for: "DIGOXIN"     []   [x]   Creatinine clearance less than 20 ml/min (calculated using Cockcroft-Gault, actual body weight and serum creatinine): Estimated Creatinine Clearance: 70.4 mL/min (by C-G formula based on SCr of 1.22 mg/dL).  CrCl ~ 92 mL/min   []   [x]  Patient has received drugs known to prolong the QT intervals within the last 48 hours (phenothiazines, tricyclics or tetracyclic antidepressants, erythromycin, H-1 antihistamines, cisapride, fluoroquinolones, azithromycin, ondansetron).   Updated information on QT prolonging agents is available to be searched on the following database:QT prolonging agents  Flexeril has a potential for Qtc prolongation (patient has not taken for at least a month if not longer) - will discontinue order   Takes cetirizine prn at home which is 2nd generation anti-histamine >> okay to use    []   [x]  Patient received a dose of a thiazide diuretic in the last 48 hours [including hydrochlorothiazide  (Oretic) alone or in any combination including triamterene (Dyazide, Maxzide)]. Stopped hydrochlorothiazide 1 week ago so okay to proceed    []   [x]  Patient received a medication known to increase dofetilide plasma concentrations prior to initial dofetilide dose:  Trimethoprim (Primsol, Proloprim) in the last 36 hours Verapamil (Calan, Verelan) in the last 36 hours or a sustained release dose in the last 72 hours Megestrol (Megace) in the last 5 days  Cimetidine (Tagamet) in the last 6 hours Ketoconazole (Nizoral) in the last 24 hours Itraconazole (Sporanox) in the last 48 hours  Prochlorperazine (Compazine) in the last 36 hours     []   [x]   Patient is known to have a history of torsades de pointes; congenital or acquired long QT syndromes.    []   [x]   Patient has received a Class 1 antiarrhythmic with less than 2 half-lives since last dose. (Disopyramide, Quinidine, Procainamide, Lidocaine, Mexiletine, Flecainide, Propafenone)    []   [x]   Patient has received amiodarone therapy in the past 3 months or amiodarone level is greater than 0.3 ng/ml.    Labs:    Component Value Date/Time   K 4.4 12/30/2023 1053   K 3.9 05/21/2017 0953   MG 2.3 12/30/2023 1053     Plan: Select One Calculated CrCl  Dose q12h  [x]  > 60 ml/min 500 mcg  []  40-60 ml/min 250 mcg  []  20-40 ml/min 125 mcg   [x]   Physician selected initial dose within range recommended for patients level of renal function - will monitor for response.  []   Physician selected initial dose outside of range recommended for patients level of renal function -  will discuss if the dose should be altered at this time.   Patient has been appropriately anticoagulated with apixaban.  Potassium: K >/= 4: Appropriate to initiate Tikosyn, no replacement needed    Magnesium: Mg >2: Appropriate to initiate Tikosyn, no replacement needed     Thank you for allowing pharmacy to participate in this patient's care,  Sherron Monday,  PharmD, BCCCP Clinical Pharmacist  Phone: [phone] 12/30/2023 12:52 PM  Please check AMION for all [facility] phone numbers After 10:00 PM, call Main Pharmacy [phone]

## 2023-12-30 NOTE — Plan of Care (Signed)

## 2023-12-30 NOTE — Telephone Encounter (Signed)
Patient Product/process development scientist completed.    The patient is insured through [facility]. Patient has [employer], may use a copay card, and/or apply for patient assistance if available.    Ran test claim for dofetilide (Tikosyn) 500 mcg and the current 30 day co-pay is $5.00.   This test claim was processed through [facility]- copay amounts may vary at other pharmacies due to pharmacy/plan contracts, or as the patient moves through the different stages of their insurance plan.     Jermaine Moody, CPHT Pharmacy Technician III Certified Patient Advocate [facility] Patient Advocate Team Direct Number: [phone]  Fax: [phone]
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
## 2023-12-31 ENCOUNTER — Encounter (HOSPITAL_COMMUNITY): Payer: Self-pay | Admitting: Cardiovascular Disease

## 2023-12-31 ENCOUNTER — Encounter: Payer: Self-pay | Admitting: Oncology

## 2023-12-31 DIAGNOSIS — I4819 Other persistent atrial fibrillation: Secondary | ICD-10-CM | POA: Diagnosis not present

## 2023-12-31 LAB — BASIC METABOLIC PANEL
Anion gap: 11 (ref 5–15)
BUN: 15 mg/dL (ref 8–23)
CO2: 22 mmol/L (ref 22–32)
Calcium: 8.8 mg/dL — ABNORMAL LOW (ref 8.9–10.3)
Chloride: 107 mmol/L (ref 98–111)
Creatinine, Ser: 1.06 mg/dL (ref 0.61–1.24)
GFR, Estimated: >60 mL/min (ref >60)
Glucose, Bld: 107 mg/dL — ABNORMAL HIGH (ref 70–99)
Potassium: 3.8 mmol/L (ref 3.5–5.1)
Sodium: 140 mmol/L (ref 135–145)

## 2023-12-31 LAB — MAGNESIUM: Magnesium: 2.1 mg/dL (ref 1.7–2.4)

## 2023-12-31 MED ORDER — PNEUMOCOCCAL 20-VAL CONJ VACC 0.5 ML IM SUSY
0.5000 mL | PREFILLED_SYRINGE | INTRAMUSCULAR | Status: AC
  Administered 2024-01-02: 0.5 mL via INTRAMUSCULAR
  Filled 2023-12-31: qty 0.5

## 2023-12-31 MED ORDER — POTASSIUM CHLORIDE CRYS ER 20 MEQ PO TBCR
40.0000 meq | EXTENDED_RELEASE_TABLET | Freq: Once | ORAL | Status: AC
  Administered 2023-12-31: 40 meq via ORAL
  Filled 2023-12-31: qty 2

## 2023-12-31 MED ORDER — DOFETILIDE 250 MCG PO CAPS
250.0000 ug | ORAL_CAPSULE | Freq: Two times a day (BID) | ORAL | Status: DC
  Administered 2023-12-31: 250 ug via ORAL
  Filled 2023-12-31: qty 1

## 2023-12-31 NOTE — Progress Notes (Signed)
Pharmacy: Dofetilide (Tikosyn) - Follow Up Assessment and Electrolyte Replacement  Pharmacy consulted to assist in monitoring and replacing electrolytes in this 73 y.o. male admitted on 12/30/2023 undergoing dofetilide initiation. First dofetilide dose: 2/17@2017 .  Labs:    Component Value Date/Time   K 3.8 12/31/2023 0523   K 3.9 05/21/2017 0953   MG 2.1 12/31/2023 0523     Plan: Potassium: K 3.8-3.9:  Give KCl 40 mEq po x1   Magnesium: Mg > 2: No additional supplementation needed   Thank you for allowing pharmacy to participate in this patient's care,  Sherron Monday, PharmD, BCCCP Clinical Pharmacist  Phone: [phone] 12/31/2023 7:27 AM  Please check AMION for all [facility] Pharmacy phone numbers After 10:00 PM, call Main Pharmacy [phone]

## 2023-12-31 NOTE — TOC CM/SW Note (Signed)
Transition of Care [facility]) - Inpatient Brief Assessment   Patient Details  Name: Jermaine Moody MRN: [id number] Date of Birth: 1950-12-24  [facility] of [facility]) CM/SW Contact:    Gala Lewandowsky, RN Phone Number: 12/31/2023, 12:26 PM   Clinical Narrative: Patient presented for Tikosyn Load. Case Manager spoke with the patient regarding co pay cost. Patient is agreeable to cost and would like to have the initial Rx filled via [facility] and the Rx refills 90 day supply escribed to [facility]. No further needs identified at this time.   Transition of Care Asessment: Insurance and Status: Insurance coverage has been reviewed Patient has primary care physician: Yes Home environment has been reviewed: reviewed Prior level of function:: independent Prior/Current Home Services: No current home services Social Drivers of Health Review: SDOH reviewed no interventions necessary Readmission risk has been reviewed: Yes Transition of care needs: no transition of care needs at this time

## 2023-12-31 NOTE — Anesthesia Preprocedure Evaluation (Signed)
Anesthesia Evaluation    Airway        Dental   Pulmonary former smoker          Cardiovascular hypertension, Pt. on medications and Pt. on home beta blockers + dysrhythmias Atrial Fibrillation   '24 ECHO: EF 45-50%, mildly decreased LVF, trivial MR   Neuro/Psych    GI/Hepatic ,,,(+) Hepatitis -, CH/o colon Ca   Endo/Other    Renal/GU Renal InsufficiencyRenal disease     Musculoskeletal   Abdominal   Peds  Hematology  (+) Blood dyscrasia (eliquis)   Anesthesia Other Findings   Reproductive/Obstetrics                              Anesthesia Physical Anesthesia Plan  ASA: 3  Anesthesia Plan: General   Post-op Pain Management: Minimal or no pain anticipated   Induction: Intravenous  PONV Risk Score and Plan: 2 and Treatment may vary due to age or medical condition  Airway Management Planned: Natural Airway and Mask  Additional Equipment: None  Intra-op Plan:   Post-operative Plan:   Informed Consent:   Plan Discussed with:   Anesthesia Plan Comments:          Anesthesia Quick Evaluation

## 2023-12-31 NOTE — Progress Notes (Signed)
EKG from yesterday evening 12/30/2023 reviewed     Shows pt remains in afib with stable QTc at 459 ms.  Continue  Tikosyn 500 mcg BID.   Potassium3.8 (02/18 0523) Magnesium  2.1 (02/18 0523) Creatinine, ser  1.06 (02/18 0523)  Potassium supplementation per pharmacy.  Pt will be NPO after midnight for DCCV if remains in afib    Canary Brim, NP-C, AGACNP-BC [facility] - Electrophysiology  12/31/2023, 7:18 AM

## 2023-12-31 NOTE — Progress Notes (Signed)
   Rounding Note    Patient Name: Jermaine Moody Date of Encounter: 12/31/2023  [facility] Cardiologist: Kristeen Miss, MD   Subjective   NAEO. Moving around the room this AM.  Vital Signs    Vitals:   12/30/23 2015 12/30/23 2327 12/31/23 0325 12/31/23 0807  BP: (!) 144/103 (!) 121/90 113/88 114/85  Pulse:  88 83 91  Resp: 18 18 19 18   Temp: 98.7 F (37.1 C) 98.6 F (37 C) 98.4 F (36.9 C) (!) 97.5 F (36.4 C)  TempSrc: Oral Oral Oral Oral  SpO2: 99% 99% 97% 97%  Weight:      Height:        Intake/Output Summary (Last 24 hours) at 12/31/2023 1056 Last data filed at 12/31/2023 0806 Gross per 24 hour  Intake 243 ml  Output --  Net 243 ml      12/30/2023   12:53 PM 12/30/2023   11:00 AM 10/29/2023    8:27 AM  Last 3 Weights  Weight (lbs) 258 lb 8 oz 260 lb 250 lb 12.8 oz  Weight (kg) 117.255 kg 117.935 kg 113.762 kg      Telemetry    Personally Reviewed  ECG    AF. QTc ~ . - Personally Reviewed  Physical Exam   GEN: No acute distress.   Cardiac: irregularly irregular, no murmurs, rubs, or gallops.  Psych: Normal affect   Assessment & Plan    #Persistent AF #High risk drug monitoring - dofetilide Continue tikosyn. Tentative DCCV after 4th dose. Continue OAC.  #HTN Controlled.     Sheria Lang T. Lalla Brothers, MD, [facility], [facility] Cardiac Electrophysiology

## 2024-01-01 ENCOUNTER — Encounter (HOSPITAL_COMMUNITY)
Admission: AD | Disposition: A | Discharge status: Home / Self Care | Payer: Self-pay | Source: Ambulatory Visit | Attending: Cardiovascular Disease

## 2024-01-01 ENCOUNTER — Encounter (HOSPITAL_COMMUNITY): Payer: Self-pay | Admitting: Anesthesiology

## 2024-01-01 DIAGNOSIS — I4819 Other persistent atrial fibrillation: Secondary | ICD-10-CM | POA: Diagnosis not present

## 2024-01-01 LAB — BASIC METABOLIC PANEL
Anion gap: 8 (ref 5–15)
BUN: 17 mg/dL (ref 8–23)
CO2: 23 mmol/L (ref 22–32)
Calcium: 8.6 mg/dL — ABNORMAL LOW (ref 8.9–10.3)
Chloride: 105 mmol/L (ref 98–111)
Creatinine, Ser: 1 mg/dL (ref 0.61–1.24)
GFR, Estimated: >60 mL/min (ref >60)
Glucose, Bld: 102 mg/dL — ABNORMAL HIGH (ref 70–99)
Potassium: 4.4 mmol/L (ref 3.5–5.1)
Sodium: 136 mmol/L (ref 135–145)

## 2024-01-01 LAB — CBC
HCT: 42.9 % (ref 39.0–52.0)
Hemoglobin: 14.5 g/dL (ref 13.0–17.0)
MCH: 29.2 pg (ref 26.0–34.0)
MCHC: 33.8 g/dL (ref 30.0–36.0)
MCV: 86.5 fL (ref 80.0–100.0)
Platelets: 85 10*3/uL — ABNORMAL LOW (ref 150–400)
RBC: 4.96 MIL/uL (ref 4.22–5.81)
RDW: 14.7 % (ref 11.5–15.5)
WBC: 5.9 10*3/uL (ref 4.0–10.5)
nRBC: 0 % (ref 0.0–0.2)

## 2024-01-01 LAB — MAGNESIUM: Magnesium: 2.1 mg/dL (ref 1.7–2.4)

## 2024-01-01 SURGERY — CARDIOVERSION (CATH LAB)
Anesthesia: General

## 2024-01-01 MED ORDER — TRIAMCINOLONE ACETONIDE 55 MCG/ACT NA AERO
2.0000 | INHALATION_SPRAY | Freq: Every day | NASAL | Status: DC
  Administered 2024-01-02 – 2024-01-03 (×2): 2 via NASAL
  Filled 2024-01-01: qty 21.6

## 2024-01-01 MED ORDER — LORATADINE 10 MG PO TABS
10.0000 mg | ORAL_TABLET | Freq: Every day | ORAL | Status: DC
  Administered 2024-01-01 – 2024-01-03 (×3): 10 mg via ORAL
  Filled 2024-01-01 (×3): qty 1

## 2024-01-01 MED ORDER — DOFETILIDE 125 MCG PO CAPS
125.0000 ug | ORAL_CAPSULE | Freq: Two times a day (BID) | ORAL | Status: DC
  Administered 2024-01-01 – 2024-01-03 (×4): 125 ug via ORAL
  Filled 2024-01-01 (×5): qty 1

## 2024-01-01 NOTE — Progress Notes (Signed)
Mobility Specialist Progress Note:    01/01/24 1435  Mobility  Activity Ambulated with assistance in hallway  Level of Assistance Modified independent, requires aide device or extra time  Assistive Device None  Distance Ambulated (ft) 500 ft  Activity Response Tolerated well  Mobility Referral Yes  Mobility visit 1 Mobility  Mobility Specialist Start Time (ACUTE ONLY) 1420  Mobility Specialist Stop Time (ACUTE ONLY) 1435  Mobility Specialist Time Calculation (min) (ACUTE ONLY) 15 min   Received pt in bed having no complaints and agreeable to mobility. Pt was asymptomatic throughout ambulation and returned to room w/o fault. Left in bed w/ call bell in reach and all needs met.   Thompson Grayer Mobility Specialist  Please contact vis Secure Chat or  Rehab Office [phone]

## 2024-01-01 NOTE — Progress Notes (Signed)
Pharmacy: Dofetilide (Tikosyn) - Follow Up Assessment and Electrolyte Replacement  Pharmacy consulted to assist in monitoring and replacing electrolytes in this 73 y.o. male admitted on 12/30/2023 undergoing dofetilide initiation. First dofetilide dose: 2/17@2017 .  Labs:    Component Value Date/Time   K 4.4 01/01/2024 0126   K 3.9 05/21/2017 0953   MG 2.1 01/01/2024 0126    Qtc went long at 535 last night - discussed with EP and will hold AM dose and reduce PM tonight. No new drug interactions with tikosyn. Scr stable at 1 (CrCl 88 mL/min).   Plan: Potassium: K >/= 4: No additional supplementation needed  Magnesium: Mg > 2: No additional supplementation needed   Thank you for allowing pharmacy to participate in this patient's care,  Sherron Monday, PharmD, BCCCP Clinical Pharmacist  Phone: [phone] 01/01/2024 7:17 AM  Please check AMION for all [facility] Pharmacy phone numbers After 10:00 PM, call Main Pharmacy [phone]

## 2024-01-01 NOTE — Plan of Care (Signed)
  Will continue to monitor patientt

## 2024-01-01 NOTE — Progress Notes (Signed)
Brief EP Note  QTc .  OK to resume tikosyn. Start PO BID this evening.  Sheria Lang T. Lalla Brothers, MD, [facility], [facility] Cardiac Electrophysiology

## 2024-01-01 NOTE — Progress Notes (Signed)
   Rounding Note    Patient Name: Jermaine Moody Date of Encounter: 01/01/2024  [facility] Cardiologist: Kristeen Miss, MD   Subjective   Converted to sinus rhythm overnight.  Vital Signs    Vitals:   12/31/23 1611 12/31/23 2029 01/01/24 0348 01/01/24 0834  BP: (!) 126/99 125/88 118/75 (!) 138/98  Pulse: 64 60 61 63  Resp: 18 18 18 18   Temp: 97.6 F (36.4 C) (!) 97.5 F (36.4 C) 97.7 F (36.5 C) 97.7 F (36.5 C)  TempSrc: Oral Oral Oral Oral  SpO2: 99% 100% 98% 99%  Weight:      Height:        Intake/Output Summary (Last 24 hours) at 01/01/2024 0935 Last data filed at 12/31/2023 2030 Gross per 24 hour  Intake 728 ml  Output --  Net 728 ml      12/30/2023   12:53 PM 12/30/2023   11:00 AM 10/29/2023    8:27 AM  Last 3 Weights  Weight (lbs) 258 lb 8 oz 260 lb 250 lb 12.8 oz  Weight (kg) 117.255 kg 117.935 kg 113.762 kg      Telemetry    Personally Reviewed  ECG    Sinus rhythm. QTc ~ 510. - Personally Reviewed  Physical Exam   GEN: No acute distress.   Cardiac: RRR, no murmurs, rubs, or gallops.  Psych: Normal affect   Assessment & Plan    #Persistent AF #High risk drug monitoring - dofetilide Now in sinus rhythm. QTc prolonged after converting to sinus, now ~ Hold AM dose of tikosyn. ECG this afternoon BEFORE next dose If ECG shows improved QTc, will resume at BID dosing with the evening dose Continue OAC.  #HTN Controlled.   Tentative discharge Friday.  Sheria Lang T. Lalla Brothers, MD, [facility], [facility] Cardiac Electrophysiology

## 2024-01-02 DIAGNOSIS — I4819 Other persistent atrial fibrillation: Secondary | ICD-10-CM | POA: Diagnosis not present

## 2024-01-02 LAB — MAGNESIUM: Magnesium: 2.3 mg/dL (ref 1.7–2.4)

## 2024-01-02 LAB — BASIC METABOLIC PANEL
Anion gap: 8 (ref 5–15)
BUN: 17 mg/dL (ref 8–23)
CO2: 24 mmol/L (ref 22–32)
Calcium: 9 mg/dL (ref 8.9–10.3)
Chloride: 108 mmol/L (ref 98–111)
Creatinine, Ser: 1.05 mg/dL (ref 0.61–1.24)
GFR, Estimated: >60 mL/min (ref >60)
Glucose, Bld: 98 mg/dL (ref 70–99)
Potassium: 4.6 mmol/L (ref 3.5–5.1)
Sodium: 140 mmol/L (ref 135–145)

## 2024-01-02 NOTE — Progress Notes (Signed)
   Rounding Note    Patient Name: Jermaine Moody Date of Encounter: 01/02/2024  [facility] Cardiologist: Kristeen Miss, MD   Subjective   NAEO.   Vital Signs    Vitals:   01/01/24 0348 01/01/24 0834 01/01/24 1932 01/02/24 0315  BP: 118/75 (!) 138/98 (!) 119/91 (!) 117/94  Pulse: 61 63 66 60  Resp: 18 18 18 18   Temp: 97.7 F (36.5 C) 97.7 F (36.5 C) 97.7 F (36.5 C) 97.7 F (36.5 C)  TempSrc: Oral Oral Oral Oral  SpO2: 98% 99% 97% 98%  Weight:      Height:        Intake/Output Summary (Last 24 hours) at 01/02/2024 0725 Last data filed at 01/01/2024 2000 Gross per 24 hour  Intake 480 ml  Output --  Net 480 ml      12/30/2023   12:53 PM 12/30/2023   11:00 AM 10/29/2023    8:27 AM  Last 3 Weights  Weight (lbs) 258 lb 8 oz 260 lb 250 lb 12.8 oz  Weight (kg) 117.255 kg 117.935 kg 113.762 kg      Telemetry    Personally Reviewed  ECG    Sinus rhythm. QTc ~ . - Personally Reviewed  Physical Exam   GEN: No acute distress.   Cardiac: RRR, no murmurs, rubs, or gallops.  Psych: Normal affect   Assessment & Plan    #Persistent AF #High risk drug monitoring - dofetilide  Now in sinus rhythm. QTc Cont dofetilide PO BID Continue OAC.  #HTN Controlled.   Tentative discharge Friday.  Sheria Lang T. Lalla Brothers, MD, [facility], [facility] Cardiac Electrophysiology

## 2024-01-02 NOTE — Progress Notes (Signed)
Mobility Specialist Progress Note;   01/02/24 1103  Mobility  Activity Ambulated independently in hallway  Level of Assistance Modified independent, requires aide device or extra time  Assistive Device None  Distance Ambulated (ft) 400 ft  Activity Response Tolerated well  Mobility Referral Yes  Mobility visit 1 Mobility  Mobility Specialist Start Time (ACUTE ONLY) 1103  Mobility Specialist Stop Time (ACUTE ONLY) 1111  Mobility Specialist Time Calculation (min) (ACUTE ONLY) 8 min   Pt eager for mobility. Required no physical assistance during ambulation, ModI. VSS throughout and no c/o during session. Pt returned back to bed with all needs met.   Caesar Bookman Mobility Specialist Please contact via SecureChat or [employer] [phone]

## 2024-01-02 NOTE — Progress Notes (Signed)
Pharmacy: Dofetilide (Tikosyn) - Follow Up Assessment and Electrolyte Replacement  Pharmacy consulted to assist in monitoring and replacing electrolytes in this 74 y.o. male admitted on 12/30/2023 undergoing dofetilide initiation. First dofetilide dose: 2/17@2017 .  Labs:    Component Value Date/Time   K 4.6 01/02/2024 0314   K 3.9 05/21/2017 0953   MG 2.3 01/02/2024 0314    Qtc 494 last night on lower tikosyn dose of 125 mcg BID. No new drug interactions with tikosyn. Scr stable at 1.05.   Plan: Potassium: K >/= 4: No additional supplementation needed  Magnesium: Mg > 2: No additional supplementation needed   Thank you for allowing pharmacy to participate in this patient's care,  Sherron Monday, PharmD, BCCCP Clinical Pharmacist  Phone: [phone] 01/02/2024 7:14 AM  Please check AMION for all [facility] Pharmacy phone numbers After 10:00 PM, call Main Pharmacy [phone]

## 2024-01-03 ENCOUNTER — Other Ambulatory Visit (HOSPITAL_COMMUNITY): Payer: Self-pay

## 2024-01-03 LAB — BASIC METABOLIC PANEL
Anion gap: 8 (ref 5–15)
BUN: 16 mg/dL (ref 8–23)
CO2: 23 mmol/L (ref 22–32)
Calcium: 8.9 mg/dL (ref 8.9–10.3)
Chloride: 107 mmol/L (ref 98–111)
Creatinine, Ser: 1.15 mg/dL (ref 0.61–1.24)
GFR, Estimated: >60 mL/min (ref >60)
Glucose, Bld: 117 mg/dL — ABNORMAL HIGH (ref 70–99)
Potassium: 4.2 mmol/L (ref 3.5–5.1)
Sodium: 138 mmol/L (ref 135–145)

## 2024-01-03 LAB — MAGNESIUM: Magnesium: 2.3 mg/dL (ref 1.7–2.4)

## 2024-01-03 MED ORDER — SCOPOLAMINE 1 MG/3DAYS TD PT72: 1.0000 | MEDICATED_PATCH | TRANSDERMAL | Status: DC

## 2024-01-03 MED ORDER — SCOPOLAMINE 1 MG/3DAYS TD PT72
1.0000 | MEDICATED_PATCH | Freq: Once | TRANSDERMAL | Status: DC
  Administered 2024-01-03: 1.5 mg via TRANSDERMAL
  Filled 2024-01-03: qty 1

## 2024-01-03 MED ORDER — DOFETILIDE 125 MCG PO CAPS
125.0000 ug | ORAL_CAPSULE | Freq: Two times a day (BID) | ORAL | 11 refills | Status: DC
  Filled 2024-01-03: qty 60, 30d supply, fill #0

## 2024-01-03 NOTE — Discharge Instructions (Signed)
 Dofetilide Capsules What is this medication? DOFETILIDE (doe FET il ide) treats a fast or irregular heartbeat (arrhythmia). It works by slowing down overactive electric signals in the heart, which stabilizes your heart rhythm. It belongs to a group of medications called antiarrhythmics. This medicine may be used for other purposes; ask your health care provider or pharmacist if you have questions. COMMON BRAND NAME(S): Tikosyn What should I tell my care team before I take this medication? They need to know if you have any of these conditions: Heart disease History of irregular heartbeat History of low levels of potassium or magnesium in the blood Kidney disease Liver disease An unusual or allergic reaction to dofetilide, other medications, foods, dyes, or preservatives Pregnant or trying to get pregnant Breast-feeding How should I use this medication? Take this medication by mouth with a glass of water. Follow the directions on the prescription label. Do not take with grapefruit juice. You can take it with or without food. If it upsets your stomach, take it with food. Take your medication at regular intervals. Do not take it more often than directed. Do not stop taking except on your care team's advice. A special MedGuide will be given to you by the pharmacist with each prescription and refill. Be sure to read this information carefully each time. Talk to your care team about the use of this medication in children. Special care may be needed. Overdosage: If you think you have taken too much of this medicine contact a poison control center or emergency room at once. NOTE: This medicine is only for you. Do not share this medicine with others. What if I miss a dose? If you miss a dose, skip it. Take your next dose at the normal time. Do not take extra or 2 doses at the same time to make up for the missed dose. What may interact with this medication? Do not take this medication with any of the  following: Benadryl (Diphenhydramine) Cimetidine Cisapride Dolutegravir Dronedarone Erdafitinib Hydrochlorothiazide Immodium Ketoconazole Megestrol Pimozide Prochlorperazine Thioridazine Trimethoprim Verapamil This medication may also interact with the following: Amiloride Cannabinoids Certain antibiotics like erythromycin or clarithromycin Certain antiviral medications for HIV or hepatitis Certain medications for depression, anxiety, or psychotic disorders Digoxin Diltiazem Grapefruit juice Metformin Nefazodone Other medications that prolong the QT interval (an abnormal heart rhythm) Quinine Triamterene Zafirlukast Ziprasidone This list may not describe all possible interactions. Give your health care provider a list of all the medicines, herbs, non-prescription drugs, or dietary supplements you use. Also tell them if you smoke, drink alcohol, or use illegal drugs. Some items may interact with your medicine. What should I watch for while using this medication? Your condition will be monitored carefully while you are receiving this medication. What side effects may I notice from receiving this medication? Side effects that you should report to your care team as soon as possible: Allergic reactions--skin rash, itching, hives, swelling of the face, lips, tongue, or throat Chest pain Heart rhythm changes--fast or irregular heartbeat, dizziness, feeling faint or lightheaded, chest pain, trouble breathing Side effects that usually do not require medical attention (report to your care team if they continue or are bothersome): Dizziness Headache Nausea Stomach pain Trouble sleeping This list may not describe all possible side effects. Call your doctor for medical advice about side effects. You may report side effects to FDA at 1-800-FDA-1088. Where should I keep my medication? Keep out of the reach of children. Store at room temperature between 15 and 30 degrees  C (59 and 86  degrees F). Throw away any unused medication after the expiration date. NOTE: This sheet is a summary. It may not cover all possible information. If you have questions about this medicine, talk to your doctor, pharmacist, or health care provider.  2024 Elsevier/Gold Standard (2021-09-29 00:00:00)

## 2024-01-03 NOTE — Progress Notes (Addendum)
Pharmacy: Dofetilide (Tikosyn) - Follow Up Assessment and Electrolyte Replacement  Pharmacy consulted to assist in monitoring and replacing electrolytes in this 73 y.o. male admitted on 12/30/2023 undergoing dofetilide initiation. First dofetilide dose: 2/17@2017 .  Labs:    Component Value Date/Time   K 4.6 01/02/2024 0314   K 3.9 05/21/2017 0953   MG 2.3 01/02/2024 0314    Qtc 498 last night on lower tikosyn dose of 125 mcg BID. No new drug interactions with tikosyn. Scr stable at 1.15.   Plan: Potassium: K >/= 4: No additional supplementation needed  Magnesium: Mg > 2: No additional supplementation needed  As patient has required on average 8 mEq of potassium replacement every day, recommend discharging patient with prescription for:  -Would hold on K replacement at discharge and monitor closely    Thank you for allowing pharmacy to participate in this patient's care,  Sherron Monday, PharmD, BCCCP Clinical Pharmacist  Phone: [phone] 01/03/2024 7:19 AM  Please check AMION for all [facility] Pharmacy phone numbers After 10:00 PM, call Main Pharmacy [phone]

## 2024-01-03 NOTE — Plan of Care (Signed)
Problem: Education: Goal: Knowledge of General Education information will improve Description: Including pain rating scale, medication(s)/side effects and non-pharmacologic comfort measures 01/03/2024 1235 by Sanda Linger, RN Outcome: Adequate for Discharge 01/03/2024 1235 by Sanda Linger, RN Outcome: Adequate for Discharge   Problem: Health Behavior/Discharge Planning: Goal: Ability to manage health-related needs will improve 01/03/2024 1235 by Sanda Linger, RN Outcome: Adequate for Discharge 01/03/2024 1235 by Sanda Linger, RN Outcome: Adequate for Discharge   Problem: Clinical Measurements: Goal: Ability to maintain clinical measurements within normal limits will improve 01/03/2024 1235 by Sanda Linger, RN Outcome: Adequate for Discharge 01/03/2024 1235 by Sanda Linger, RN Outcome: Adequate for Discharge Goal: Will remain free from infection 01/03/2024 1235 by Sanda Linger, RN Outcome: Adequate for Discharge 01/03/2024 1235 by Sanda Linger, RN Outcome: Adequate for Discharge Goal: Diagnostic test results will improve 01/03/2024 1235 by Sanda Linger, RN Outcome: Adequate for Discharge 01/03/2024 1235 by Sanda Linger, RN Outcome: Adequate for Discharge Goal: Respiratory complications will improve 01/03/2024 1235 by Sanda Linger, RN Outcome: Adequate for Discharge 01/03/2024 1235 by Sanda Linger, RN Outcome: Adequate for Discharge Goal: Cardiovascular complication will be avoided 01/03/2024 1235 by Sanda Linger, RN Outcome: Adequate for Discharge 01/03/2024 1235 by Sanda Linger, RN Outcome: Adequate for Discharge   Problem: Activity: Goal: Risk for activity intolerance will decrease 01/03/2024 1235 by Sanda Linger, RN Outcome: Adequate for Discharge 01/03/2024 1235 by Sanda Linger, RN Outcome: Adequate for Discharge   Problem: Nutrition: Goal: Adequate nutrition will be maintained 01/03/2024 1235 by Sanda Linger, RN Outcome:  Adequate for Discharge 01/03/2024 1235 by Sanda Linger, RN Outcome: Adequate for Discharge   Problem: Coping: Goal: Level of anxiety will decrease 01/03/2024 1235 by Sanda Linger, RN Outcome: Adequate for Discharge 01/03/2024 1235 by Sanda Linger, RN Outcome: Adequate for Discharge   Problem: Elimination: Goal: Will not experience complications related to bowel motility 01/03/2024 1235 by Sanda Linger, RN Outcome: Adequate for Discharge 01/03/2024 1235 by Sanda Linger, RN Outcome: Adequate for Discharge Goal: Will not experience complications related to urinary retention 01/03/2024 1235 by Sanda Linger, RN Outcome: Adequate for Discharge 01/03/2024 1235 by Sanda Linger, RN Outcome: Adequate for Discharge   Problem: Pain Managment: Goal: General experience of comfort will improve and/or be controlled 01/03/2024 1235 by Sanda Linger, RN Outcome: Adequate for Discharge 01/03/2024 1235 by Sanda Linger, RN Outcome: Adequate for Discharge   Problem: Safety: Goal: Ability to remain free from injury will improve 01/03/2024 1235 by Sanda Linger, RN Outcome: Adequate for Discharge 01/03/2024 1235 by Sanda Linger, RN Outcome: Adequate for Discharge   Problem: Skin Integrity: Goal: Risk for impaired skin integrity will decrease 01/03/2024 1235 by Sanda Linger, RN Outcome: Adequate for Discharge 01/03/2024 1235 by Sanda Linger, RN Outcome: Adequate for Discharge   Problem: Education: Goal: Knowledge of disease or condition will improve 01/03/2024 1235 by Sanda Linger, RN Outcome: Adequate for Discharge 01/03/2024 1235 by Sanda Linger, RN Outcome: Adequate for Discharge Goal: Understanding of medication regimen will improve 01/03/2024 1235 by Sanda Linger, RN Outcome: Adequate for Discharge 01/03/2024 1235 by Sanda Linger, RN Outcome: Adequate for Discharge Goal: Individualized Educational Video(s) 01/03/2024 1235 by Sanda Linger,  RN Outcome: Adequate for Discharge 01/03/2024 1235 by Sanda Linger, RN Outcome: Adequate for Discharge   Problem: Activity: Goal: Ability to tolerate increased activity will  improve 01/03/2024 1235 by Sanda Linger, RN Outcome: Adequate for Discharge 01/03/2024 1235 by Sanda Linger, RN Outcome: Adequate for Discharge   Problem: Cardiac: Goal: Ability to achieve and maintain adequate cardiopulmonary perfusion will improve 01/03/2024 1235 by Sanda Linger, RN Outcome: Adequate for Discharge 01/03/2024 1235 by Sanda Linger, RN Outcome: Adequate for Discharge   Problem: Health Behavior/Discharge Planning: Goal: Ability to safely manage health-related needs after discharge will improve 01/03/2024 1235 by Sanda Linger, RN Outcome: Adequate for Discharge 01/03/2024 1235 by Sanda Linger, RN Outcome: Adequate for Discharge

## 2024-01-03 NOTE — Discharge Summary (Addendum)
ELECTROPHYSIOLOGY DISCHARGE SUMMARY    Patient ID: Jermaine Moody,  MRN: [id number], DOB/AGE: [birth year] 72 y.o.  Admit date: 12/30/2023 Discharge date: 01/03/2024  Primary Care Physician: [name], MD  Primary Cardiologist: Kristeen Miss, MD  Electrophysiologist: Dr. Lalla Brothers   Primary Discharge Diagnosis:  Persistent atrial fibrillation status post Tikosyn loading this admission  Secondary Discharge Diagnosis:  2. Secondary Hypercoagulable State  3. HTN   Allergies  Allergen Reactions   Other     NO BLOOD OR BLOOD PRODUCTS     Procedures This Admission:  1.  Tikosyn loading    Brief HPI: Jermaine Moody is a 73 y.o. male with a past medical history as noted above.  They were referred to EP for treatment options of atrial fibrillation.  Risks, benefits, and alternatives to Tikosyn were reviewed with the patient who wished to proceed with admission for loading.  Hospital Course:  The patient was admitted and Tikosyn was initiated.  Renal function and electrolytes were followed during the hospitalization.  Their QTc remained stable. The patient converted chemically and did not require cardioversion. The patients QT prolonged, requiring dose reduction to 125 mcg BID. He required one extra day in hospital for observation of QTc due to holding a dose and restarting at . He developed nausea and was treated with scopolamine patch x1. They were monitored on telemetry up to discharge. On the day of discharge, they were examined by Dr. Lalla Brothers  who considered them stable for discharge to home.  Follow-up has been arranged with the Atrial Fibrillation clinic in approximately 1 week.   Physical Exam: Vitals:   01/02/24 1642 01/02/24 1924 01/03/24 0502 01/03/24 1050  BP: (!) 129/92 125/81 120/85   Pulse: 77 71 68 82  Resp: 16 18 16    Temp: 98.6 F (37 C) 98 F (36.7 C) 98.3 F (36.8 C)   TempSrc: Oral Oral Oral   SpO2: 98% 99% 100%   Weight:       Height:        GEN- pleasant adult male, sitting on side of bed in NAD, A&O x 3. Normal affect.  Lungs- CTAB, Normal effort.  Heart- Regular rate and rhythm, frequent PAC's. No M/G/R GI- Soft, NT, ND Extremities- No clubbing, cyanosis, or edema Skin- no rash or lesion  Labs:   Lab Results  Component Value Date   WBC 5.9 01/01/2024   HGB 14.5 01/01/2024   HCT 42.9 01/01/2024   MCV 86.5 01/01/2024   PLT 85 (L) 01/01/2024    Recent Labs  Lab 01/03/24 0749  NA 138  K 4.2  CL 107  CO2 23  BUN 16  CREATININE 1.15  CALCIUM 8.9  GLUCOSE 117*    Discharge Medications:  Allergies as of 01/03/2024       Reactions   Other    NO BLOOD OR BLOOD PRODUCTS        Medication List     STOP taking these medications    amoxicillin 500 MG capsule Commonly known as: AMOXIL   cyclobenzaprine 5 MG tablet Commonly known as: FLEXERIL   fexofenadine 180 MG tablet Commonly known as: Allegra Allergy   sildenafil 50 MG tablet Commonly known as: VIAGRA       TAKE these medications    acetaminophen 500 MG tablet Commonly known as: TYLENOL Take 1,000 mg by mouth every 6 (six) hours as needed for mild pain.   amLODipine-benazepril 5-40 MG capsule Commonly known as: LOTREL Take 1  capsule by mouth daily.   cetirizine 10 MG tablet Commonly known as: ZYRTEC Take 10 mg by mouth daily as needed for allergies.   dofetilide 125 MCG capsule Commonly known as: TIKOSYN Take 1 capsule (125 mcg total) by mouth 2 (two) times daily.   Eliquis 5 MG Tabs tablet Generic drug: apixaban Take 5 mg by mouth 2 (two) times daily. Is getting it from the patient assistance program   metoprolol succinate 25 MG 24 hr tablet Commonly known as: Toprol XL Take 1 tablet (25 mg total) by mouth daily. Take along with 50mg  to equal 75mg  daily   metoprolol succinate 50 MG 24 hr tablet Commonly known as: TOPROL-XL Take 1 tablet (50 mg total) by mouth daily. Take along with 25mg  to equal 75mg   daily   multivitamin capsule Take 1 capsule by mouth daily.        Disposition:  Home with follow up in AF clinic in 1 week as in AVS.   Duration of Discharge Encounter: 36 minutes   Signed, Canary Brim, NP-C, AGACNP-BC [facility] - Electrophysiology  01/03/2024, 12:03 PM

## 2024-01-13 ENCOUNTER — Encounter (HOSPITAL_COMMUNITY): Payer: Self-pay | Admitting: Physician Assistant

## 2024-01-13 ENCOUNTER — Other Ambulatory Visit (HOSPITAL_COMMUNITY): Payer: Self-pay

## 2024-01-13 ENCOUNTER — Ambulatory Visit (HOSPITAL_COMMUNITY)
Admit: 2024-01-13 | Discharge: 2024-01-13 | Disposition: A | Discharge status: Home / Self Care | Payer: 59 | Attending: Physician Assistant | Admitting: Physician Assistant

## 2024-01-13 VITALS — BP 128/98 | HR 112 | Ht 71.0 in | Wt 259.8 lb

## 2024-01-13 DIAGNOSIS — Z7901 Long term (current) use of anticoagulants: Secondary | ICD-10-CM | POA: Insufficient documentation

## 2024-01-13 DIAGNOSIS — Z8619 Personal history of other infectious and parasitic diseases: Secondary | ICD-10-CM | POA: Diagnosis not present

## 2024-01-13 DIAGNOSIS — D6869 Other thrombophilia: Secondary | ICD-10-CM

## 2024-01-13 DIAGNOSIS — Z79899 Other long term (current) drug therapy: Secondary | ICD-10-CM | POA: Diagnosis not present

## 2024-01-13 DIAGNOSIS — N189 Chronic kidney disease, unspecified: Secondary | ICD-10-CM | POA: Diagnosis not present

## 2024-01-13 DIAGNOSIS — I130 Hypertensive heart and chronic kidney disease with heart failure and stage 1 through stage 4 chronic kidney disease, or unspecified chronic kidney disease: Secondary | ICD-10-CM | POA: Insufficient documentation

## 2024-01-13 DIAGNOSIS — Z5181 Encounter for therapeutic drug level monitoring: Secondary | ICD-10-CM

## 2024-01-13 DIAGNOSIS — I4819 Other persistent atrial fibrillation: Secondary | ICD-10-CM

## 2024-01-13 DIAGNOSIS — I5022 Chronic systolic (congestive) heart failure: Secondary | ICD-10-CM | POA: Insufficient documentation

## 2024-01-13 LAB — BASIC METABOLIC PANEL
Anion gap: 11 (ref 5–15)
BUN: 17 mg/dL (ref 8–23)
CO2: 20 mmol/L — ABNORMAL LOW (ref 22–32)
Calcium: 8.8 mg/dL — ABNORMAL LOW (ref 8.9–10.3)
Chloride: 107 mmol/L (ref 98–111)
Creatinine, Ser: 1.12 mg/dL (ref 0.61–1.24)
GFR, Estimated: >60 mL/min (ref >60)
Glucose, Bld: 107 mg/dL — ABNORMAL HIGH (ref 70–99)
Potassium: 4 mmol/L (ref 3.5–5.1)
Sodium: 138 mmol/L (ref 135–145)

## 2024-01-13 LAB — CBC
HCT: 46.4 % (ref 39.0–52.0)
Hemoglobin: 15.4 g/dL (ref 13.0–17.0)
MCH: 29.1 pg (ref 26.0–34.0)
MCHC: 33.2 g/dL (ref 30.0–36.0)
MCV: 87.7 fL (ref 80.0–100.0)
Platelets: 93 10*3/uL — ABNORMAL LOW (ref 150–400)
RBC: 5.29 MIL/uL (ref 4.22–5.81)
RDW: 14.9 % (ref 11.5–15.5)
WBC: 7.7 10*3/uL (ref 4.0–10.5)
nRBC: 0 % (ref 0.0–0.2)

## 2024-01-13 LAB — MAGNESIUM: Magnesium: 2.1 mg/dL (ref 1.7–2.4)

## 2024-01-13 MED ORDER — DOFETILIDE 125 MCG PO CAPS
125.0000 ug | ORAL_CAPSULE | Freq: Two times a day (BID) | ORAL | 1 refills | Status: DC
  Filled 2024-01-13 – 2024-01-27 (×3): qty 180, 90d supply, fill #0
  Filled 2024-04-26: qty 180, 90d supply, fill #1

## 2024-01-13 NOTE — Patient Instructions (Addendum)
 Cardioversion scheduled for: Friday, March 7th   - Arrive at the [facility] and go to admitting at 7:00am   - Do not eat or drink anything after midnight the night prior to your procedure.   - Take all your morning medication (except diabetic medications) with a sip of water prior to arrival.  - You will not be able to drive home after your procedure.    - Do NOT miss any doses of your blood thinner - if you should miss a dose please notify our office immediately.   - If you feel as if you go back into normal rhythm prior to scheduled cardioversion, please notify our office immediately.   If your procedure is canceled in the cardioversion suite you will be charged a cancellation fee.
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
## 2024-01-16 NOTE — Progress Notes (Signed)
 Unable to reach patient about procedure, but was able to leave a detailed message. Stated that the patient needed to arrive at the hospital at 0700, remain NPO after 0000, needs to have a ride home and a responsible adult to stay with them for 24 hours after the procedure. Instructed the patient to call back if they had any questions.

## 2024-01-17 ENCOUNTER — Other Ambulatory Visit: Payer: Self-pay

## 2024-01-17 ENCOUNTER — Ambulatory Visit (HOSPITAL_COMMUNITY)
Admission: RE | Admit: 2024-01-17 | Discharge: 2024-01-17 | Disposition: A | Discharge status: Home / Self Care | Attending: Cardiology | Admitting: Cardiology

## 2024-01-17 ENCOUNTER — Ambulatory Visit (HOSPITAL_COMMUNITY): Admitting: Anesthesiology

## 2024-01-17 ENCOUNTER — Encounter (HOSPITAL_COMMUNITY): Payer: Self-pay | Admitting: Cardiology

## 2024-01-17 ENCOUNTER — Encounter (HOSPITAL_COMMUNITY)
Admission: RE | Disposition: A | Discharge status: Home / Self Care | Payer: Self-pay | Source: Home / Self Care | Attending: Cardiology

## 2024-01-17 DIAGNOSIS — N189 Chronic kidney disease, unspecified: Secondary | ICD-10-CM | POA: Insufficient documentation

## 2024-01-17 DIAGNOSIS — Z87891 Personal history of nicotine dependence: Secondary | ICD-10-CM

## 2024-01-17 DIAGNOSIS — D6869 Other thrombophilia: Secondary | ICD-10-CM | POA: Insufficient documentation

## 2024-01-17 DIAGNOSIS — I130 Hypertensive heart and chronic kidney disease with heart failure and stage 1 through stage 4 chronic kidney disease, or unspecified chronic kidney disease: Secondary | ICD-10-CM | POA: Diagnosis not present

## 2024-01-17 DIAGNOSIS — F419 Anxiety disorder, unspecified: Secondary | ICD-10-CM

## 2024-01-17 DIAGNOSIS — I10 Essential (primary) hypertension: Secondary | ICD-10-CM

## 2024-01-17 DIAGNOSIS — I4891 Unspecified atrial fibrillation: Secondary | ICD-10-CM

## 2024-01-17 DIAGNOSIS — Z79899 Other long term (current) drug therapy: Secondary | ICD-10-CM | POA: Diagnosis not present

## 2024-01-17 DIAGNOSIS — B182 Chronic viral hepatitis C: Secondary | ICD-10-CM | POA: Diagnosis not present

## 2024-01-17 DIAGNOSIS — Z7901 Long term (current) use of anticoagulants: Secondary | ICD-10-CM | POA: Insufficient documentation

## 2024-01-17 DIAGNOSIS — I4819 Other persistent atrial fibrillation: Secondary | ICD-10-CM | POA: Diagnosis not present

## 2024-01-17 HISTORY — PX: CARDIOVERSION: EP1203

## 2024-01-17 SURGERY — CARDIOVERSION (CATH LAB)
Anesthesia: General

## 2024-01-17 MED ORDER — LIDOCAINE 2% (20 MG/ML) 5 ML SYRINGE
INTRAMUSCULAR | Status: DC | PRN
  Administered 2024-01-17: 20 mg via INTRAVENOUS

## 2024-01-17 MED ORDER — SODIUM CHLORIDE 0.9% FLUSH: 3.0000 mL | Freq: Two times a day (BID) | INTRAVENOUS | Status: DC

## 2024-01-17 MED ORDER — SODIUM CHLORIDE 0.9% FLUSH: 3.0000 mL | INTRAVENOUS | Status: DC | PRN

## 2024-01-17 MED ORDER — PROPOFOL 10 MG/ML IV BOLUS
INTRAVENOUS | Status: DC | PRN
  Administered 2024-01-17: 80 mg via INTRAVENOUS

## 2024-01-17 SURGICAL SUPPLY — 1 items: PAD DEFIB RADIO PHYSIO CONN (PAD) ×1 IMPLANT

## 2024-01-17 NOTE — Anesthesia Postprocedure Evaluation (Signed)
 Anesthesia Post Note  Patient: Jermaine Moody  Procedure(s) Performed: CARDIOVERSION     Patient location during evaluation: Cath Lab Anesthesia Type: General Level of consciousness: awake and alert Pain management: pain level controlled Vital Signs Assessment: post-procedure vital signs reviewed and stable Respiratory status: spontaneous breathing, nonlabored ventilation, respiratory function stable and patient connected to nasal cannula oxygen Cardiovascular status: blood pressure returned to baseline and stable Postop Assessment: no apparent nausea or vomiting Anesthetic complications: no   No notable events documented.  Last Vitals:  Vitals:   01/17/24 0800 01/17/24 0810  BP: 105/76 109/84  Pulse: 65 (!) 59  Resp: 16 16  Temp:    SpO2: 98% 97%    Last Pain:  Vitals:   01/17/24 0810  TempSrc:   PainSc: 0-No pain                 Andres Vest S

## 2024-01-17 NOTE — CV Procedure (Signed)
 Electrical Cardioversion Procedure Note Jermaine Moody [id number] Dec 23, 1950  Procedure: Electrical Cardioversion Indications:  Atrial Fibrillation  Procedure Details Consent: Risks of procedure as well as the alternatives and risks of each were explained to the (patient/caregiver).  Consent for procedure obtained. Time Out: Verified patient identification, verified procedure, site/side was marked, verified correct patient position, special equipment/implants available, medications/allergies/relevent history reviewed, required imaging and test results available.  Performed  Patient placed on cardiac monitor, pulse oximetry, supplemental oxygen as necessary.  Sedation given:  Pt sedated by anesthesia with lidocaine 20 mg and diprovan 80 mg IV. Pacer pads placed anterior and posterior chest.  Cardioverted 1 time(s).  Cardioverted at 300J.  Evaluation Findings: Post procedure EKG shows: NSR Complications: None Patient did tolerate procedure well.   Jermaine Moody 01/17/2024, 7:15 AM

## 2024-01-17 NOTE — Transfer of Care (Signed)
 Immediate Anesthesia Transfer of Care Note  Patient: Jermaine Moody  Procedure(s) Performed: CARDIOVERSION  Patient Location: Cath Lab  Anesthesia Type:MAC  Level of Consciousness: awake and patient cooperative  Airway & Oxygen Therapy: Patient Spontanous Breathing and Patient connected to face mask oxygen  Post-op Assessment: Report given to RN, Post -op Vital signs reviewed and stable, Patient moving all extremities, Patient moving all extremities X 4, and Patient able to stick tongue midline  Post vital signs: Reviewed and stable  Last Vitals:  Vitals Value Taken Time  BP 124/100 01/17/24 0745  Temp    Pulse 116 01/17/24 0746  Resp 16 01/17/24 0746  SpO2 96 % 01/17/24 0746  Vitals shown include unfiled device data.  Last Pain:  Vitals:   01/17/24 0654  TempSrc: Temporal  PainSc: 0-No pain         Complications: No notable events documented.

## 2024-01-17 NOTE — Anesthesia Preprocedure Evaluation (Signed)
 Anesthesia Evaluation  Patient identified by MRN, date of birth, ID band Patient awake    Reviewed: Allergy & Precautions, H&P , NPO status , Patient's Chart, lab work & pertinent test results  Airway Mallampati: II   Neck ROM: full    Dental   Pulmonary former smoker   breath sounds clear to auscultation       Cardiovascular hypertension, + dysrhythmias Atrial Fibrillation  Rhythm:irregular Rate:Normal     Neuro/Psych  PSYCHIATRIC DISORDERS Anxiety        GI/Hepatic ,,,(+) Hepatitis -, C  Endo/Other    Renal/GU stones     Musculoskeletal   Abdominal   Peds  Hematology   Anesthesia Other Findings   Reproductive/Obstetrics                             Anesthesia Physical Anesthesia Plan  ASA: 3  Anesthesia Plan: General   Post-op Pain Management:    Induction: Intravenous  PONV Risk Score and Plan: 2 and Propofol infusion and Treatment may vary due to age or medical condition  Airway Management Planned: Nasal Cannula  Additional Equipment:   Intra-op Plan:   Post-operative Plan:   Informed Consent: I have reviewed the patients History and Physical, chart, labs and discussed the procedure including the risks, benefits and alternatives for the proposed anesthesia with the patient or authorized representative who has indicated his/her understanding and acceptance.     Dental advisory given  Plan Discussed with: CRNA, Anesthesiologist and Surgeon  Anesthesia Plan Comments:        Anesthesia Quick Evaluation
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
## 2024-01-17 NOTE — Interval H&P Note (Signed)
 History and Physical Interval Note:  01/17/2024 7:17 AM  Jermaine Moody  has presented today for surgery, with the diagnosis of afib.  The various methods of treatment have been discussed with the patient and family. After consideration of risks, benefits and other options for treatment, the patient has consented to  Procedure(s): CARDIOVERSION (N/A) as a surgical intervention.  The patient's history has been reviewed, patient examined, no change in status, stable for surgery.  I have reviewed the patient's chart and labs.  Questions were answered to the patient's satisfaction.     [name]

## 2024-01-19 ENCOUNTER — Encounter (HOSPITAL_COMMUNITY): Payer: Self-pay | Admitting: Cardiology

## 2024-01-25 ENCOUNTER — Other Ambulatory Visit (HOSPITAL_BASED_OUTPATIENT_CLINIC_OR_DEPARTMENT_OTHER): Payer: Self-pay

## 2024-01-25 ENCOUNTER — Other Ambulatory Visit (HOSPITAL_COMMUNITY): Payer: Self-pay

## 2024-01-27 ENCOUNTER — Other Ambulatory Visit (HOSPITAL_COMMUNITY): Payer: Self-pay

## 2024-01-30 ENCOUNTER — Ambulatory Visit: Payer: 59 | Admitting: Pulmonary Disease

## 2024-01-30 ENCOUNTER — Ambulatory Visit (HOSPITAL_COMMUNITY)
Admission: RE | Admit: 2024-01-30 | Discharge: 2024-01-30 | Disposition: A | Discharge status: Home / Self Care | Source: Ambulatory Visit | Attending: Physician Assistant | Admitting: Physician Assistant

## 2024-01-30 ENCOUNTER — Encounter (HOSPITAL_COMMUNITY): Payer: Self-pay | Admitting: Physician Assistant

## 2024-01-30 VITALS — BP 120/90 | HR 74 | Ht 71.0 in | Wt 259.4 lb

## 2024-01-30 DIAGNOSIS — Z79899 Other long term (current) drug therapy: Secondary | ICD-10-CM | POA: Diagnosis not present

## 2024-01-30 DIAGNOSIS — I4819 Other persistent atrial fibrillation: Secondary | ICD-10-CM | POA: Diagnosis not present

## 2024-01-30 DIAGNOSIS — I130 Hypertensive heart and chronic kidney disease with heart failure and stage 1 through stage 4 chronic kidney disease, or unspecified chronic kidney disease: Secondary | ICD-10-CM | POA: Insufficient documentation

## 2024-01-30 DIAGNOSIS — Z6836 Body mass index (BMI) 36.0-36.9, adult: Secondary | ICD-10-CM | POA: Diagnosis not present

## 2024-01-30 DIAGNOSIS — D6869 Other thrombophilia: Secondary | ICD-10-CM | POA: Insufficient documentation

## 2024-01-30 DIAGNOSIS — Z7901 Long term (current) use of anticoagulants: Secondary | ICD-10-CM | POA: Insufficient documentation

## 2024-01-30 DIAGNOSIS — E669 Obesity, unspecified: Secondary | ICD-10-CM | POA: Insufficient documentation

## 2024-01-30 DIAGNOSIS — Z5181 Encounter for therapeutic drug level monitoring: Secondary | ICD-10-CM | POA: Diagnosis not present
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
## 2024-01-31 ENCOUNTER — Ambulatory Visit: Payer: 59 | Admitting: Pulmonary Disease

## 2024-02-12 ENCOUNTER — Ambulatory Visit (INDEPENDENT_AMBULATORY_CARE_PROVIDER_SITE_OTHER): Payer: 59 | Admitting: Family Medicine

## 2024-02-12 ENCOUNTER — Encounter: Payer: Self-pay | Admitting: Family Medicine

## 2024-02-12 VITALS — BP 122/50 | HR 55 | Temp 98.3°F | Ht 71.0 in | Wt 255.6 lb

## 2024-02-12 DIAGNOSIS — I4819 Other persistent atrial fibrillation: Secondary | ICD-10-CM | POA: Diagnosis not present

## 2024-02-12 DIAGNOSIS — R739 Hyperglycemia, unspecified: Secondary | ICD-10-CM | POA: Diagnosis not present

## 2024-02-12 DIAGNOSIS — Z23 Encounter for immunization: Secondary | ICD-10-CM

## 2024-02-12 DIAGNOSIS — I10 Essential (primary) hypertension: Secondary | ICD-10-CM | POA: Diagnosis not present

## 2024-02-12 LAB — POCT GLYCOSYLATED HEMOGLOBIN (HGB A1C): Hemoglobin A1C: 5.8 % — AB (ref 4.0–5.6)

## 2024-02-12 NOTE — Assessment & Plan Note (Signed)
 Seen on previous set of labs, A1C today is 5.8 which is In the prediabetes range. I counseled the patient on reducing sugar and starches in his diet, I will continue to monitor this every 6 months.

## 2024-02-12 NOTE — Assessment & Plan Note (Signed)
 On tikosyn and metoprolol, is s/p cardioversion x 2, seen on 3/20 at the a fib clinic and looked to be in NSR, however today his HR sounds irregular and fast, his apple watch confirms this, however on pulse oximetry it looks like he is in the 60's. I will send a message to the a fib clinic letting them know. Pt reports that he was told he would need a cardiac ablation if the cardioversions were not successful.

## 2024-02-12 NOTE — Progress Notes (Signed)
 Established Patient Office Visit  Subjective   Patient ID: Jermaine Moody, male    DOB: Sep 05, 1951  Age: 73 y.o. MRN: [id number]  Chief Complaint  Patient presents with   Medical Management of Chronic Issues   Back Pain    Patient complains of intermittent low back pain since moving furniture some time ago, requests a muscle relaxer that would work with Tikosyn    Persistent atrial fibrillation-- patient had 2 cardioversions, second one was with tikosyn. States it worked, so far he is still in NSR per his cardiologist. States he will go back in 3 months for follow up. He denies any SOB, no dizziness, no chest pain, no other associated cardiac symptoms.  I reviewed the notes from the a fib clinic as well as the operative reports from the cardioversions with patient today. He reports he has an apple watch and records his heart rate, states he feels like his HR is elevated today. He denies any fatigue or presyncopal symptoms.   Chronic low back pain-- he reports that he continues to move around heavy objects. Occasionally gets muscle strain in his lower back wen doing so. Is asking about a muscle relaxer he could use if his back was ever strained again.   Patient had labs 01/13/24, these were reviewed with the patient-- he had elevated blood glucose at the time, pt states he was fasting for those labs. A1C performed in office    Current Outpatient Medications  Medication Instructions   acetaminophen (TYLENOL) 1,000 mg, Every 6 hours PRN   amLODipine-benazepril (LOTREL) 5-40 MG capsule 1 capsule, Oral, Daily   apixaban (ELIQUIS) 5 mg, 2 times daily   cetirizine (ZYRTEC) 10 mg, Daily PRN   dofetilide (TIKOSYN) 125 mcg, Oral, 2 times daily   metoprolol succinate (TOPROL XL) 25 mg, Oral, Daily, Take along with 50mg  to equal 75mg  daily   metoprolol succinate (TOPROL-XL) 50 mg, Oral, Daily, Take along with 25mg  to equal 75mg  daily   Multiple Vitamin (MULTIVITAMIN) capsule 1 capsule, 2  times weekly    Patient Active Problem List   Diagnosis Date Noted   Hyperglycemia 02/12/2024   Encounter for monitoring dofetilide therapy 01/13/2024   Hypercoagulable state due to persistent atrial fibrillation (HCC) 10/29/2023   Persistent atrial fibrillation (HCC) 01/08/2023   Port-A-Cath in place 12/04/2016   Cancer of descending colon s/p left robotic colectomy 11/14/2016 11/14/2016   Chronic hepatitis C without hepatic coma (HCC) 09/06/2015   Chronic cholecystitis with calculus 07/21/2015   Preventative health care 01/12/2011   ERECTILE DYSFUNCTION, NON-ORGANIC 05/25/2009   Anxiety state 12/29/2007   Essential hypertension 12/29/2007   Allergic rhinitis 12/29/2007      Review of Systems  All other systems reviewed and are negative.     Objective:     BP (!) 122/50   Pulse (!) 55   Temp 98.3 F (36.8 C) (Oral)   Ht 5\' 11"  (1.803 m)   Wt 255 lb 9.6 oz (115.9 kg)   SpO2 97%   BMI 35.65 kg/m    Physical Exam Vitals reviewed.  Constitutional:      Appearance: Normal appearance. He is well-groomed and normal weight.  Cardiovascular:     Rate and Rhythm: Tachycardia present. Rhythm irregular.     Pulses: Normal pulses.     Heart sounds: S1 normal and S2 normal. No murmur heard. Pulmonary:     Effort: Pulmonary effort is normal.     Breath sounds: Normal breath sounds and air entry.  No wheezing.  Musculoskeletal:     Right lower leg: No edema.     Left lower leg: No edema.  Neurological:     General: No focal deficit present.     Mental Status: He is alert and oriented to person, place, and time.     Gait: Gait is intact.  Psychiatric:        Mood and Affect: Mood and affect normal.      Results for orders placed or performed in visit on 02/12/24  POC HgB A1c  Result Value Ref Range   Hemoglobin A1C 5.8 (A) 4.0 - 5.6 %   HbA1c POC (<> result, manual entry)     HbA1c, POC (prediabetic range)     HbA1c, POC (controlled diabetic range)        The  10-year ASCVD risk score (Arnett DK, et al., 2019) is: 17.5%    Assessment & Plan:  Hyperglycemia Assessment & Plan: Seen on previous set of labs, A1C today is 5.8 which is In the prediabetes range. I counseled the patient on reducing sugar and starches in his diet, I will continue to monitor this every 6 months.   Orders: -     POCT glycosylated hemoglobin (Hb A1C) -     Collection capillary blood specimen  Immunization due -     Tdap vaccine greater than or equal to 7yo IM  Persistent atrial fibrillation (HCC) Assessment & Plan: On tikosyn and metoprolol, is s/p cardioversion x 2, seen on 3/20 at the a fib clinic and looked to be in NSR, however today his HR sounds irregular and fast, his apple watch confirms this, however on pulse oximetry it looks like he is in the 60's. I will send a message to the a fib clinic letting them know. Pt reports that he was told he would need a cardiac ablation if the cardioversions were not successful.    Essential hypertension Assessment & Plan: Current hypertension medications:       Sig   amLODipine-benazepril (LOTREL) 5-40 MG capsule (Taking) Take 1 capsule by mouth daily.   metoprolol succinate (TOPROL XL) 25 MG 24 hr tablet (Taking) Take 1 tablet (25 mg total) by mouth daily. Take along with 50mg  to equal 75mg  daily   metoprolol succinate (TOPROL-XL) 50 MG 24 hr tablet (Taking) Take 1 tablet (50 mg total) by mouth daily. Take along with 25mg  to equal 75mg  daily      Chronic, stable on the above medications, will continue as prescribed.       Return in about 6 months (around 08/13/2024) for annual physical exam.    [name], MD

## 2024-02-12 NOTE — Assessment & Plan Note (Signed)
 Current hypertension medications:       Sig   amLODipine-benazepril (LOTREL) 5-40 MG capsule (Taking) Take 1 capsule by mouth daily.   metoprolol succinate (TOPROL XL) 25 MG 24 hr tablet (Taking) Take 1 tablet (25 mg total) by mouth daily. Take along with 50mg  to equal 75mg  daily   metoprolol succinate (TOPROL-XL) 50 MG 24 hr tablet (Taking) Take 1 tablet (50 mg total) by mouth daily. Take along with 25mg  to equal 75mg  daily      Chronic, stable on the above medications, will continue as prescribed.

## 2024-02-25 ENCOUNTER — Other Ambulatory Visit (HOSPITAL_COMMUNITY): Payer: Self-pay

## 2024-03-12 ENCOUNTER — Telehealth: Payer: Self-pay | Admitting: Cardiology

## 2024-03-12 NOTE — Telephone Encounter (Signed)
 New Message:     Patient wants to know if he needs to keep his appointment on 03-17-24? He says he has not been in Afib for over a month.

## 2024-03-12 NOTE — Telephone Encounter (Signed)
 Called patient to let him know he should keep his appointment next week, since his is on Tikosyn  and A. Fib is never truly cured.

## 2024-03-13 DIAGNOSIS — S39012A Strain of muscle, fascia and tendon of lower back, initial encounter: Secondary | ICD-10-CM

## 2024-03-15 NOTE — Progress Notes (Unsigned)
 Electrophysiology Office Follow up Visit Note:    Date:  03/16/2024   ID:  Jermaine Moody, Jermaine Moody Jul 22, 1951, MRN [id number]  PCP:  Aida House, MD  [facility] Cardiologist:  Ahmad Alert, MD  [facility] Electrophysiologist:  Boyce Byes, MD    Interval History:     Jermaine Moody is a 73 y.o. male who presents for a follow up visit.   The patient last saw Jermaine Moody on January 30, 2024.  The patient has a history of hypertension, hyperlipidemia, hepatitis C posttreatment, CKD, chronic systolic heart failure and atrial fibrillation.  His diagnosis of atrial fibrillation was in February 2024.  He is on Eliquis  for stroke prophylaxis.  He was loaded with Tikosyn  in February of this year.  He had returned to atrial fibrillation and had a cardioversion in March of this year.  He felt fatigued while out of rhythm.  He presents today to discuss catheter ablation as an adjunctive treatment for his atrial fibrillation.  He tells me that he has been back in sinus rhythm for at least 1 month.  He is feeling better.      Past medical, surgical, social and family history were reviewed.  ROS:   Please see the history of present illness.    All other systems reviewed and are negative.  EKGs/Labs/Other Studies Reviewed:    The following studies were reviewed today:  October 21, 2023 echo EF 45 to 50% Dilated left and right atrium Trivial MR    EKG Interpretation Date/Time:  Monday Mar 16 2024 08:51:13 EDT Ventricular Rate:  68 PR Interval:  188 QRS Duration:  102 QT Interval:  438 QTC Calculation: 465 R Axis:   -21  Text Interpretation: Normal sinus rhythm Normal ECG Confirmed by Harvie Liner [phone]) on 03/16/2024 9:23:43 AM    Physical Exam:    VS:  BP (!) 126/90   Pulse 68   Ht 5\' 11"  (1.803 m)   Wt 254 lb (115.2 kg)   SpO2 97%   BMI 35.43 kg/m     Wt Readings from Last 3 Encounters:  03/16/24 254 lb (115.2 kg)  02/12/24 255 lb 9.6 oz (115.9  kg)  01/30/24 259 lb 6.4 oz (117.7 kg)     GEN: no distress CARD: RRR, No MRG RESP: No IWOB. CTAB.      ASSESSMENT:    1. Persistent atrial fibrillation (HCC)   2. Encounter for long-term (current) use of high-risk medication    PLAN:    In order of problems listed above:  #Persistent atrial fibrillation #High risk med monitoring-Tikosyn  The patient is largely maintaining sinus rhythm but he has had atrial fibrillation despite treatment with Tikosyn  requiring cardioversion.  I discussed rhythm control options for his atrial fibrillation including continuing with Tikosyn  versus adding catheter ablation.  I have discussed the catheter ablation procedure in detail with the patient including the risks, recovery and likelihood of success.  Discussed treatment options today for AF including antiarrhythmic drug therapy and ablation. Discussed risks, recovery and likelihood of success with each treatment strategy. Risk, benefits, and alternatives to EP study and ablation for afib were discussed. These risks include but are not limited to stroke, bleeding, vascular damage, tamponade, perforation, damage to the esophagus, lungs, phrenic nerve and other structures, pulmonary vein stenosis, worsening renal function, coronary vasospasm and death.  Discussed potential need for repeat ablation procedures and antiarrhythmic drugs after an initial ablation. The patient understands these risks. Carto, ICE, anesthesia are requested for  the procedure if we end up proceeding.  Will also obtain CT PV protocol prior to the procedure to exclude LAA thrombus and further evaluate atrial anatomy.  His QTc is acceptable on today's EKG for ongoing Tikosyn  use.  He will think about his options and let us  know if he wants to proceed with catheter ablation.  If he has a recurrence of arrhythmia he would be interested in proceeding.  Follow-up 4 months with APP   Signed, Harvie Liner, MD, [facility], [facility] 03/16/2024  9:32 AM    Electrophysiology [facility]

## 2024-03-16 ENCOUNTER — Ambulatory Visit: Attending: Cardiology | Admitting: Cardiology

## 2024-03-16 ENCOUNTER — Encounter: Payer: Self-pay | Admitting: Cardiology

## 2024-03-16 VITALS — BP 126/90 | HR 68 | Ht 71.0 in | Wt 254.0 lb

## 2024-03-16 DIAGNOSIS — Z79899 Other long term (current) drug therapy: Secondary | ICD-10-CM

## 2024-03-16 DIAGNOSIS — I4819 Other persistent atrial fibrillation: Secondary | ICD-10-CM

## 2024-03-16 NOTE — Patient Instructions (Signed)
 Medication Instructions:  Your physician recommends that you continue on your current medications as directed. Please refer to the Current Medication list given to you today.  *If you need a refill on your cardiac medications before your next appointment, please call your pharmacy*   Follow-Up: At [facility], you and your health needs are our priority.  As part of our continuing mission to provide you with exceptional heart care, our providers are all part of one team.  This team includes your primary Cardiologist (physician) and Advanced Practice Providers or APPs (Physician Assistants and Nurse Practitioners) who all work together to provide you with the care you need, when you need it.  Your next appointment:   4 months  Provider:   You will see one of the following Advanced Practice Providers on your designated Care Team:   Mertha Abrahams, Kennard Pea [address] [place], PA-C Suzann Riddle, NP Creighton Doffing, NP

## 2024-03-20 NOTE — Telephone Encounter (Unsigned)
 Copied from CRM [id number]. Topic: Clinical - Medication Question >> Mar 20, 2024 12:38 PM Orien Bird wrote: Reason for CRM: Patient called stating dr was suppose to replace his muscle spasm medication with something else and he stated that he is wondering can she call it in or can she tell him what the name of the medication is. He is wanting someone from the clinic to reach out to him regarding this. Call back [phone] work number [phone]

## 2024-03-22 NOTE — Telephone Encounter (Signed)
 I don't know why these are coming to me, please send to casey brown

## 2024-03-23 ENCOUNTER — Other Ambulatory Visit (HOSPITAL_COMMUNITY): Payer: Self-pay

## 2024-03-23 MED ORDER — CYCLOBENZAPRINE HCL 5 MG PO TABS
5.0000 mg | ORAL_TABLET | Freq: Three times a day (TID) | ORAL | 1 refills | Status: AC | PRN
  Filled 2024-03-23: qty 30, 10d supply, fill #0

## 2024-03-23 NOTE — Telephone Encounter (Signed)
Script for cyclobenzaprine sent to pharmacy

## 2024-03-23 NOTE — Telephone Encounter (Signed)
 Left a detailed message with the information below at the patient's cell number.

## 2024-04-07 ENCOUNTER — Other Ambulatory Visit (HOSPITAL_COMMUNITY): Payer: Self-pay

## 2024-04-07 ENCOUNTER — Other Ambulatory Visit: Payer: Self-pay | Admitting: Family Medicine

## 2024-04-07 MED ORDER — AMLODIPINE BESY-BENAZEPRIL HCL 5-40 MG PO CAPS
1.0000 | ORAL_CAPSULE | Freq: Every day | ORAL | 1 refills | Status: DC
  Filled 2024-04-07: qty 90, 90d supply, fill #0
  Filled 2024-06-19 – 2024-07-08 (×2): qty 90, 90d supply, fill #1

## 2024-05-01 ENCOUNTER — Ambulatory Visit (HOSPITAL_COMMUNITY): Admitting: Physician Assistant

## 2024-05-04 ENCOUNTER — Ambulatory Visit (HOSPITAL_COMMUNITY)
Admission: RE | Admit: 2024-05-04 | Discharge: 2024-05-04 | Disposition: A | Discharge status: Home / Self Care | Source: Ambulatory Visit | Attending: Physician Assistant | Admitting: Physician Assistant

## 2024-05-04 ENCOUNTER — Encounter (HOSPITAL_COMMUNITY): Payer: Self-pay | Admitting: Physician Assistant

## 2024-05-04 VITALS — BP 126/70 | HR 68 | Ht 71.0 in | Wt 258.2 lb

## 2024-05-04 DIAGNOSIS — Z79899 Other long term (current) drug therapy: Secondary | ICD-10-CM

## 2024-05-04 DIAGNOSIS — D6869 Other thrombophilia: Secondary | ICD-10-CM

## 2024-05-04 DIAGNOSIS — Z5181 Encounter for therapeutic drug level monitoring: Secondary | ICD-10-CM | POA: Diagnosis not present

## 2024-05-04 DIAGNOSIS — I4819 Other persistent atrial fibrillation: Secondary | ICD-10-CM
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
## 2024-05-18 ENCOUNTER — Other Ambulatory Visit (HOSPITAL_COMMUNITY): Payer: Self-pay | Admitting: Physician Assistant

## 2024-05-18 ENCOUNTER — Other Ambulatory Visit (HOSPITAL_COMMUNITY): Payer: Self-pay

## 2024-05-18 ENCOUNTER — Other Ambulatory Visit: Payer: Self-pay

## 2024-05-18 DIAGNOSIS — I4819 Other persistent atrial fibrillation: Secondary | ICD-10-CM

## 2024-05-18 MED ORDER — METOPROLOL SUCCINATE ER 25 MG PO TB24
25.0000 mg | ORAL_TABLET | Freq: Every day | ORAL | 1 refills | Status: DC
  Filled 2024-05-18: qty 90, 90d supply, fill #0
  Filled 2024-08-17: qty 90, 90d supply, fill #1

## 2024-06-20 ENCOUNTER — Other Ambulatory Visit (HOSPITAL_COMMUNITY): Payer: Self-pay

## 2024-07-09 ENCOUNTER — Other Ambulatory Visit (HOSPITAL_COMMUNITY): Payer: Self-pay

## 2024-07-20 ENCOUNTER — Ambulatory Visit: Admitting: Physician Assistant

## 2024-07-21 ENCOUNTER — Other Ambulatory Visit (HOSPITAL_COMMUNITY): Payer: Self-pay

## 2024-07-21 ENCOUNTER — Other Ambulatory Visit (HOSPITAL_COMMUNITY): Payer: Self-pay | Admitting: Physician Assistant

## 2024-07-21 MED ORDER — DOFETILIDE 125 MCG PO CAPS
125.0000 ug | ORAL_CAPSULE | Freq: Two times a day (BID) | ORAL | 1 refills | Status: AC
  Filled 2024-07-21: qty 180, 90d supply, fill #0
  Filled 2024-10-20: qty 180, 90d supply, fill #1

## 2024-08-04 ENCOUNTER — Ambulatory Visit (HOSPITAL_COMMUNITY)
Admission: RE | Admit: 2024-08-04 | Discharge: 2024-08-04 | Disposition: A | Discharge status: Home / Self Care | Source: Ambulatory Visit | Attending: Physician Assistant | Admitting: Physician Assistant

## 2024-08-04 VITALS — BP 122/82 | HR 65 | Ht 71.0 in | Wt 257.0 lb

## 2024-08-04 DIAGNOSIS — Z79899 Other long term (current) drug therapy: Secondary | ICD-10-CM | POA: Diagnosis not present

## 2024-08-04 DIAGNOSIS — I4819 Other persistent atrial fibrillation: Secondary | ICD-10-CM

## 2024-08-04 DIAGNOSIS — D6869 Other thrombophilia: Secondary | ICD-10-CM

## 2024-08-04 DIAGNOSIS — Z5181 Encounter for therapeutic drug level monitoring: Secondary | ICD-10-CM | POA: Diagnosis not present
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
## 2024-08-05 ENCOUNTER — Ambulatory Visit (HOSPITAL_COMMUNITY): Payer: Self-pay | Admitting: Physician Assistant

## 2024-08-05 LAB — BASIC METABOLIC PANEL WITH GFR
BUN/Creatinine Ratio: 16 (ref 10–24)
BUN: 20 mg/dL (ref 8–27)
CO2: 21 mmol/L (ref 20–29)
Calcium: 9.3 mg/dL (ref 8.6–10.2)
Chloride: 102 mmol/L (ref 96–106)
Creatinine, Ser: 1.24 mg/dL (ref 0.76–1.27)
Glucose: 84 mg/dL (ref 70–99)
Potassium: 4.5 mmol/L (ref 3.5–5.2)
Sodium: 140 mmol/L (ref 134–144)
eGFR: 62 mL/min/1.73 (ref >59)

## 2024-08-05 LAB — MAGNESIUM: Magnesium: 2.1 mg/dL (ref 1.6–2.3)

## 2024-08-13 ENCOUNTER — Encounter: Admitting: Family Medicine

## 2024-08-14 ENCOUNTER — Encounter: Payer: Self-pay | Admitting: Family Medicine

## 2024-08-14 ENCOUNTER — Ambulatory Visit: Admitting: Family Medicine

## 2024-08-14 VITALS — BP 136/95 | HR 56 | Temp 98.3°F | Ht 69.0 in | Wt 259.5 lb

## 2024-08-14 DIAGNOSIS — Z125 Encounter for screening for malignant neoplasm of prostate: Secondary | ICD-10-CM | POA: Diagnosis not present

## 2024-08-14 DIAGNOSIS — Z0000 Encounter for general adult medical examination without abnormal findings: Secondary | ICD-10-CM

## 2024-08-14 DIAGNOSIS — R739 Hyperglycemia, unspecified: Secondary | ICD-10-CM | POA: Diagnosis not present

## 2024-08-14 DIAGNOSIS — Z13220 Encounter for screening for lipoid disorders: Secondary | ICD-10-CM | POA: Diagnosis not present

## 2024-08-14 DIAGNOSIS — Z23 Encounter for immunization: Secondary | ICD-10-CM | POA: Diagnosis not present

## 2024-08-14 LAB — LIPID PANEL
Cholesterol: 165 mg/dL (ref 0–200)
HDL: 47.8 mg/dL (ref >39.00)
LDL Cholesterol: 104 mg/dL — ABNORMAL HIGH (ref 0–99)
NonHDL: 117.47
Total CHOL/HDL Ratio: 3
Triglycerides: 68 mg/dL (ref 0.0–149.0)
VLDL: 13.6 mg/dL (ref 0.0–40.0)

## 2024-08-14 LAB — POCT GLYCOSYLATED HEMOGLOBIN (HGB A1C): Hemoglobin A1C: 5.9 % — AB (ref 4.0–5.6)

## 2024-08-14 LAB — PSA: PSA: 0.93 ng/mL (ref 0.10–4.00)

## 2024-08-14 NOTE — Progress Notes (Signed)
 Complete physical exam  Patient: Jermaine Moody   DOB: November 23, 1950   73 y.o. Male  MRN: [id number]  Subjective:    Chief Complaint  Patient presents with   Annual Exam    Jermaine Moody is a 73 y.o. male who presents today for a complete physical exam. He reports consuming a general diet. Eats good sources of protein, eats fruit daily, dinner consists of a protein, rice and greens. Eats a lot of sandwiches. Does eat rice and pasta, eats fast food more than he should. Home exercise routine includes walking 1-2 hrs per week.  He generally feels well. He reports sleeping well. He does not have additional problems to discuss today.   Most recent fall risk assessment:    08/14/2023   11:16 AM  Fall Risk   Number falls in past yr: 0  Injury with Fall? 0  Risk for fall due to : No Fall Risks  Follow up Falls evaluation completed     Most recent depression screenings:    08/14/2024    1:13 PM 02/12/2024    7:58 AM  PHQ 2/9 Scores  PHQ - 2 Score 0 0  PHQ- 9 Score 0 1    Vision:Not within last year  and Dental: No current dental problems and Receives regular dental care  Patient Active Problem List   Diagnosis Date Noted   Hyperglycemia 02/12/2024   Encounter for monitoring dofetilide  therapy 01/13/2024   Hypercoagulable state due to persistent atrial fibrillation (HCC) 10/29/2023   Persistent atrial fibrillation (HCC) 01/08/2023   Port-A-Cath in place 12/04/2016   Cancer of descending colon s/p left robotic colectomy 11/14/2016 11/14/2016   Chronic hepatitis C without hepatic coma (HCC) 09/06/2015   Chronic cholecystitis with calculus 07/21/2015   Preventative health care 01/12/2011   ERECTILE DYSFUNCTION, NON-ORGANIC 05/25/2009   Anxiety state 12/29/2007   Essential hypertension 12/29/2007   Allergic rhinitis 12/29/2007      Patient Care Team: [name], MD as PCP - General (Family Medicine) [name], Aleene PARAS, MD (Inactive) as PCP - Cardiology  (Cardiology) Cindie Ole DASEN, MD as PCP - Electrophysiology (Cardiology) Sheldon Standing, MD as Consulting Physician (General Surgery) Armbruster, Standing SQUIBB, MD as Consulting Physician (Gastroenterology) Cloretta Arley NOVAK, MD as Consulting Physician (Oncology)   Outpatient Medications Prior to Visit  Medication Sig   acetaminophen  (TYLENOL ) 500 MG tablet Take 1,000 mg by mouth every 6 (six) hours as needed for mild pain. (Patient taking differently: Take 1,000 mg by mouth as needed for mild pain (pain score 1-3).)   amLODipine -benazepril  (LOTREL ) 5-40 MG capsule Take 1 capsule by mouth daily.   apixaban  (ELIQUIS ) 5 MG TABS tablet Take 5 mg by mouth 2 (two) times daily. Is getting it from the patient assistance program   cetirizine (ZYRTEC) 10 MG tablet Take 10 mg by mouth daily as needed for allergies.   cyclobenzaprine  (FLEXERIL ) 5 MG tablet Take 1 tablet (5 mg total) by mouth 3 (three) times daily as needed for muscle spasms.   dofetilide  (TIKOSYN ) 125 MCG capsule Take 1 capsule (125 mcg total) by mouth 2 (two) times daily.   metoprolol  succinate (TOPROL  XL) 25 MG 24 hr tablet Take 1 tablet (25 mg total) by mouth daily. Take along with 50mg  to equal 75mg  daily   metoprolol  succinate (TOPROL -XL) 50 MG 24 hr tablet Take 1 tablet (50 mg total) by mouth daily. Take along with 25mg  to equal 75mg  daily   Multiple Vitamin (MULTIVITAMIN) capsule Take 1 capsule  by mouth 2 (two) times a week.   No facility-administered medications prior to visit.    Review of Systems  HENT:  Negative for hearing loss.   Eyes:  Negative for blurred vision.  Respiratory:  Negative for shortness of breath.   Cardiovascular:  Negative for chest pain.  Gastrointestinal: Negative.   Genitourinary: Negative.   Musculoskeletal:  Negative for back pain.  Neurological:  Negative for headaches.  Psychiatric/Behavioral:  Negative for depression.        Objective:     BP (!) 136/95 (BP Location: Left Arm, Patient  Position: Sitting, Cuff Size: Large)   Pulse (!) 56   Temp 98.3 F (36.8 C) (Oral)   Ht 5' 9 (1.753 m)   Wt 259 lb 8 oz (117.7 kg)   SpO2 98%   BMI 38.32 kg/m    Physical Exam Vitals reviewed.  Constitutional:      Appearance: Normal appearance. He is well-groomed. He is obese.  HENT:     Right Ear: Tympanic membrane and ear canal normal.     Left Ear: Tympanic membrane and ear canal normal.     Mouth/Throat:     Mouth: Mucous membranes are moist.     Pharynx: No posterior oropharyngeal erythema.  Eyes:     Extraocular Movements: Extraocular movements intact.     Conjunctiva/sclera: Conjunctivae normal.  Neck:     Thyroid: No thyromegaly.  Cardiovascular:     Rate and Rhythm: Normal rate and regular rhythm.     Heart sounds: S1 normal and S2 normal. No murmur heard. Pulmonary:     Effort: Pulmonary effort is normal.     Breath sounds: Normal breath sounds and air entry. No rales.  Abdominal:     General: Abdomen is flat. Bowel sounds are normal.  Musculoskeletal:     Right lower leg: No edema.     Left lower leg: No edema.  Lymphadenopathy:     Cervical: No cervical adenopathy.  Neurological:     General: No focal deficit present.     Mental Status: He is alert and oriented to person, place, and time.     Gait: Gait is intact.  Psychiatric:        Mood and Affect: Mood and affect normal.      Results for orders placed or performed in visit on 08/14/24  POC HgB A1c  Result Value Ref Range   Hemoglobin A1C 5.9 (A) 4.0 - 5.6 %   HbA1c POC (<> result, manual entry)     HbA1c, POC (prediabetic range)     HbA1c, POC (controlled diabetic range)         Assessment & Plan:    Routine Health Maintenance and Physical Exam  Immunization History  Administered Date(s) Administered   Fluad Trivalent(High Dose 65+) 08/14/2023   Hepatitis A, Adult 09/06/2015   Hepatitis B, ADULT 09/06/2015   Influenza,inj,Quad PF,6+ Mos 08/12/2013, 07/22/2015    Influenza-Unspecified 07/24/2017, 10/08/2022   PFIZER(Purple Top)SARS-COV-2 Vaccination 11/23/2019, 12/18/2019   PNEUMOCOCCAL CONJUGATE-20 01/02/2024   Td 12/13/1998, 05/12/2008   Tdap 02/12/2024   Zoster, Live 12/18/2013    Health Maintenance  Topic Date Due   Zoster Vaccines- Shingrix (1 of 2) 07/25/1971   COVID-19 Vaccine (3 - Pfizer risk series) 01/15/2020   Influenza Vaccine  06/12/2024   Colonoscopy  03/06/2026   DTaP/Tdap/Td (4 - Td or Tdap) 02/11/2034   Pneumococcal Vaccine: 50+ Years  Completed   Hepatitis C Screening  Completed   HPV VACCINES  Aged Out   Meningococcal B Vaccine  Aged Out   Hepatitis B Vaccines 19-59 Average Risk  Discontinued    Discussed health benefits of physical activity, and encouraged him to engage in regular exercise appropriate for his age and condition.  Hyperglycemia -     POCT glycosylated hemoglobin (Hb A1C) -     Collection capillary blood specimen  Preventative health care  Lipid screening -     Lipid panel; Future  Immunization due -     Flu vaccine HIGH DOSE PF(Fluzone Trivalent)  Prostate cancer screening -     PSA; Future  General physical exam findings are normal today except . I reviewed the patient's preventative testing, immunizations, and lifestyle habits. I made appropriate recommendations and placed orders for the appropriate tests and/or vaccinations. I counseled the patient on the CDC's recommendations for healthy exercise and diet. I counseled the patient on healthy sleep habits and stress management. Handouts to reinforce the counseling were given at the conclusion of the visit.    No follow-ups on file.     Heron CHRISTELLA Sharper, MD

## 2024-08-17 ENCOUNTER — Telehealth: Payer: Self-pay

## 2024-08-17 NOTE — Telephone Encounter (Signed)
 Ordering provider: Dr. Calhoun Associated diagnoses: Persistent atrial fibrillation (HCC) [I48.19] Pre-procedural cardiovascular examination [Z01.810] Daytime somnolence [R40.0]  Patient notified of PIN ([id number]) on 08/17/2024 via Notification Method: phone  The patient stated that he may not do the sleep study because it's been so long. I apologized to the patient on our behalf. He stated if he decide no to do it he will bring the device back at his next appointment.  Phone note routed to covering staff for follow-up.

## 2024-08-18 ENCOUNTER — Ambulatory Visit: Payer: Self-pay | Admitting: Family Medicine

## 2024-08-18 DIAGNOSIS — E782 Mixed hyperlipidemia: Secondary | ICD-10-CM

## 2024-08-21 ENCOUNTER — Other Ambulatory Visit (HOSPITAL_COMMUNITY): Payer: Self-pay

## 2024-08-21 MED ORDER — ATORVASTATIN CALCIUM 20 MG PO TABS
20.0000 mg | ORAL_TABLET | Freq: Every day | ORAL | 3 refills | Status: DC
  Filled 2024-08-21: qty 90, 90d supply, fill #0
  Filled 2024-09-16: qty 90, 90d supply, fill #1

## 2024-09-16 ENCOUNTER — Other Ambulatory Visit: Payer: Self-pay

## 2024-09-16 ENCOUNTER — Other Ambulatory Visit (HOSPITAL_COMMUNITY): Payer: Self-pay

## 2024-09-28 ENCOUNTER — Other Ambulatory Visit (HOSPITAL_COMMUNITY): Payer: Self-pay

## 2024-09-28 ENCOUNTER — Ambulatory Visit: Attending: Cardiology | Admitting: Cardiology

## 2024-09-28 ENCOUNTER — Encounter: Payer: Self-pay | Admitting: Cardiology

## 2024-09-28 VITALS — BP 112/76 | HR 66 | Ht 69.0 in | Wt 258.0 lb

## 2024-09-28 DIAGNOSIS — I4819 Other persistent atrial fibrillation: Secondary | ICD-10-CM

## 2024-09-28 DIAGNOSIS — E782 Mixed hyperlipidemia: Secondary | ICD-10-CM

## 2024-09-28 MED ORDER — ATORVASTATIN CALCIUM 40 MG PO TABS
40.0000 mg | ORAL_TABLET | Freq: Every day | ORAL | 3 refills | Status: AC
  Filled 2024-09-28: qty 90, 90d supply, fill #0

## 2024-09-28 NOTE — Patient Instructions (Addendum)
 Medication Instructions:  INCREASE Atorvastatin to 40 mg   *If you need a refill on your cardiac medications before your next appointment, please call your pharmacy*  Lab Work: LIPID PANEL IN 02/2025  If you have labs (blood work) drawn today and your tests are completely normal, you will receive your results only by: MyChart Message (if you have MyChart) OR A paper copy in the mail If you have any lab test that is abnormal or we need to change your treatment, we will call you to review the results.  Testing/Procedures: ECHOCARDIOGRAM  Your physician has requested that you have an echocardiogram. Echocardiography is a painless test that uses sound waves to create images of your heart. It provides your doctor with information about the size and shape of your heart and how well your heart's chambers and valves are working. This procedure takes approximately one hour. There are no restrictions for this procedure. Please do NOT wear cologne, perfume, aftershave, or lotions (deodorant is allowed). Please arrive 15 minutes prior to your appointment time.  Please note: We ask at that you not bring children with you during ultrasound (echo/ vascular) testing. Due to room size and safety concerns, children are not allowed in the ultrasound rooms during exams. Our front office staff cannot provide observation of children in our lobby area while testing is being conducted. An adult accompanying a patient to their appointment will only be allowed in the ultrasound room at the discretion of the ultrasound technician under special circumstances. We apologize for any inconvenience.   Follow-Up: At [facility], you and your health needs are our priority.  As part of our continuing mission to provide you with exceptional heart care, our providers are all part of one team.  This team includes your primary Cardiologist (physician) and Advanced Practice Providers or APPs (Physician Assistants and Nurse  Practitioners) who all work together to provide you with the care you need, when you need it.  Your next appointment:   6 month(s)  Provider:   Newman JINNY Lawrence, MD

## 2024-09-28 NOTE — Progress Notes (Signed)
 Cardiology Office Note:  .   Date:  09/28/2024  ID:  Jermaine Moody, DOB 27-Dec-1950, MRN [id number] PCP: [name], MD  [facility] Providers Cardiologist:  Newman Lawrence, MD PCP: [name], MD  Chief Complaint  Patient presents with   Atrial Fibrillation     Jermaine Moody is a 73 y.o. male with hypertension, hyperlipidemia, hepatitis C s/p treatment, CKD, HFmrEF, persistent Afib-currently on dofetilide   Discussed the use of AI scribe software for clinical note transcription with the patient, who gave verbal consent to proceed.  History of Present Illness Jermaine Moody is a 73 year old male with atrial fibrillation who presents for a follow-up visit.  He experiences palpitations and fatigue when in atrial fibrillation, but these episodes have decreased in frequency since starting Tikosyn , with the last episode occurring a few months ago. He is on Tikosyn  125 mg twice daily and tolerates it well. His current medications also include amlodipine , benazepril , metoprolol  75 mg, Lipitor 20 mg, and Eliquis  5 mg twice daily. He is working with a engineer, civil (consulting) to renew assistance for Eliquis . He denies chest pain, shortness of breath, or significant leg swelling, though he notes occasional swelling on one side, possibly due to amlodipine . He does not smoke or consume alcohol.      Vitals:   09/28/24 0808  BP: 112/76  Pulse: 66  SpO2: 98%      Review of Systems  Cardiovascular:  Positive for palpitations (Occasional). Negative for chest pain, dyspnea on exertion, leg swelling and syncope.        Studies Reviewed: SABRA        EKG 08/04/2024: Sinus rhythm 65 bpm Normal EKG  Echocardiogram 10/2023: 1. Left ventricular ejection fraction, by estimation, is 45 to 50%. The  left ventricle has mildly decreased function. The left ventricle  demonstrates global hypokinesis. Left ventricular diastolic parameters are  indeterminate.    2. IVC not visualized. Peak RV-RA gradient 35 mmHg. Right ventricular  systolic function is normal. The right ventricular size is mildly  enlarged.   3. Left atrial size was mildly dilated.   4. Right atrial size was moderately dilated.   5. The mitral valve is normal in structure. Trivial mitral valve  regurgitation. No evidence of mitral stenosis.   6. The aortic valve is tricuspid. Aortic valve regurgitation is not  visualized. No aortic stenosis is present.   7. The patient was in atrial fibrillation.   Labs 3-07/2024: Chol 165, TG 68, HDL 47, LDL 104 HbA1C 5.9% Hb 15.4 Cr 1.24  08/2023: TSH 2.5  Risk Assessment/Calculations:    CHA2DS2-VASc Score = 3  This indicates a 3.2% annual risk of stroke. The patient's score is based upon: CHF History: 1 HTN History: 1 Diabetes History: 0 Stroke History: 0 Vascular Disease History: 0 Age Score: 1 Gender Score: 0    Physical Exam Vitals and nursing note reviewed.  Constitutional:      General: He is not in acute distress. Neck:     Vascular: No JVD.  Cardiovascular:     Rate and Rhythm: Normal rate and regular rhythm.     Heart sounds: Normal heart sounds. No murmur heard. Pulmonary:     Effort: Pulmonary effort is normal.     Breath sounds: Normal breath sounds. No wheezing or rales.  Musculoskeletal:     Right lower leg: No edema.     Left lower leg: No edema.      VISIT DIAGNOSES: No  diagnosis found.   Jermaine Moody is a 73 y.o. male with hypertension, hyperlipidemia, hepatitis C s/p treatment, CKD, HFmrEF, persistent Afib-currently on dofetilide   Assessment & Plan Persistent atrial fibrillation: Maintaining sinus rhythm on Tikosyn .   No recent breakthrough A-fib episodes.   Continue Eliquis  5 mg twice daily.   I encouraged him to use it a more monitor that he already has to evaluate for OSA.    HFmrEF: Clinically, no signs/symptoms of heart failure.  EF was previously 45 to 60%, likely  related to A-fib. Recheck echocardiogram.  Obesity: Discussed 10-20% weight loss potential with lifestyle changes. Prefers diet and lifestyle changes over medications or surgery. - Encouraged walking 10,000 steps a day. - Advised on calorie-controlled diet and intermittent fasting. - Consider weight loss medications if lifestyle changes are insufficient. - Discuss surgical options if lifestyle changes are insufficient.  Mixed hyperlipidemia:  Cholesterol levels not optimal. Increasing Lipitor may help. - Increased Lipitor to 40 mg daily. - Check lipid panel in April.  Hypertension: Managed with current regimen, no issues reported. - Continue current antihypertensive regimen.  Lower extremity edema: Mild edema, possibly related to amlodipine . - Advised leg elevation at night. - Recommended use of compression stockings.        Meds ordered this encounter  Medications   atorvastatin (LIPITOR) 40 MG tablet    Sig: Take 1 tablet (40 mg total) by mouth daily.    Dispense:  90 tablet    Refill:  3     F/u in 6 months  Signed, Newman JINNY Lawrence, MD

## 2024-10-03 ENCOUNTER — Other Ambulatory Visit: Payer: Self-pay | Admitting: Family Medicine

## 2024-10-05 ENCOUNTER — Other Ambulatory Visit (HOSPITAL_COMMUNITY): Payer: Self-pay

## 2024-10-05 ENCOUNTER — Other Ambulatory Visit: Payer: Self-pay

## 2024-10-05 DIAGNOSIS — I4819 Other persistent atrial fibrillation: Secondary | ICD-10-CM

## 2024-10-05 MED ORDER — APIXABAN 5 MG PO TABS: 5.0000 mg | ORAL_TABLET | Freq: Two times a day (BID) | ORAL | 1 refills | Status: AC

## 2024-10-05 MED ORDER — AMLODIPINE BESY-BENAZEPRIL HCL 5-40 MG PO CAPS
1.0000 | ORAL_CAPSULE | Freq: Every day | ORAL | 1 refills | Status: AC
  Filled 2024-10-05: qty 90, 90d supply, fill #0

## 2024-10-05 NOTE — Telephone Encounter (Signed)
 Eliquis  mg refill request received. Patient is 73 years old, weight-117kg, Crea-1.24 on 08/04/24, Diagnosis-Afib, and last seen by Dr. Elmira on 09/28/24. Dose is appropriate based on dosing criteria. Will send in refill to requested pharmacy.

## 2024-10-16 ENCOUNTER — Other Ambulatory Visit (HOSPITAL_COMMUNITY): Payer: Self-pay

## 2024-11-09 ENCOUNTER — Ambulatory Visit (HOSPITAL_COMMUNITY)
Admission: RE | Admit: 2024-11-09 | Discharge: 2024-11-09 | Disposition: A | Discharge status: Home / Self Care | Source: Ambulatory Visit | Attending: Internal Medicine | Admitting: Internal Medicine

## 2024-11-09 ENCOUNTER — Other Ambulatory Visit (HOSPITAL_COMMUNITY): Payer: Self-pay

## 2024-11-09 ENCOUNTER — Other Ambulatory Visit (HOSPITAL_COMMUNITY): Payer: Self-pay | Admitting: Physician Assistant

## 2024-11-09 DIAGNOSIS — I4819 Other persistent atrial fibrillation: Secondary | ICD-10-CM | POA: Insufficient documentation

## 2024-11-09 LAB — ECHOCARDIOGRAM COMPLETE
Area-P 1/2: 3.32 cm2
S' Lateral: 2.9 cm

## 2024-11-09 MED ORDER — METOPROLOL SUCCINATE ER 25 MG PO TB24
25.0000 mg | ORAL_TABLET | Freq: Every day | ORAL | 1 refills | Status: AC
  Filled 2024-11-09: qty 90, 90d supply, fill #0

## 2024-11-16 ENCOUNTER — Encounter: Payer: Self-pay | Admitting: Oncology

## 2024-11-18 ENCOUNTER — Ambulatory Visit (HOSPITAL_COMMUNITY): Admitting: Physician Assistant

## 2024-11-19 ENCOUNTER — Encounter: Payer: Self-pay | Admitting: Oncology

## 2024-11-20 ENCOUNTER — Ambulatory Visit (HOSPITAL_COMMUNITY)
Admission: RE | Admit: 2024-11-20 | Discharge: 2024-11-20 | Disposition: A | Payer: Self-pay | Source: Ambulatory Visit | Attending: Physician Assistant | Admitting: Physician Assistant

## 2024-11-20 ENCOUNTER — Encounter: Payer: Self-pay | Admitting: Oncology

## 2024-11-20 ENCOUNTER — Encounter (HOSPITAL_COMMUNITY): Payer: Self-pay

## 2024-11-20 VITALS — BP 142/90 | HR 59 | Ht 69.0 in | Wt 256.8 lb

## 2024-11-20 DIAGNOSIS — I4891 Unspecified atrial fibrillation: Secondary | ICD-10-CM | POA: Diagnosis not present

## 2024-11-20 DIAGNOSIS — Z5181 Encounter for therapeutic drug level monitoring: Secondary | ICD-10-CM | POA: Diagnosis not present

## 2024-11-20 DIAGNOSIS — I4819 Other persistent atrial fibrillation: Secondary | ICD-10-CM | POA: Diagnosis not present

## 2024-11-20 DIAGNOSIS — D6869 Other thrombophilia: Secondary | ICD-10-CM

## 2024-11-20 DIAGNOSIS — Z79899 Other long term (current) drug therapy: Secondary | ICD-10-CM
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
## 2024-11-21 LAB — BASIC METABOLIC PANEL WITH GFR
BUN/Creatinine Ratio: 18 (ref 10–24)
BUN: 18 mg/dL (ref 8–27)
CO2: 27 mmol/L (ref 20–29)
Calcium: 9.7 mg/dL (ref 8.6–10.2)
Chloride: 102 mmol/L (ref 96–106)
Creatinine, Ser: 0.99 mg/dL (ref 0.76–1.27)
Glucose: 102 mg/dL — ABNORMAL HIGH (ref 70–99)
Potassium: 4.8 mmol/L (ref 3.5–5.2)
Sodium: 143 mmol/L (ref 134–144)
eGFR: 81 mL/min/1.73

## 2024-11-21 LAB — MAGNESIUM: Magnesium: 2.2 mg/dL (ref 1.6–2.3)

## 2024-11-23 ENCOUNTER — Ambulatory Visit (HOSPITAL_COMMUNITY): Payer: Self-pay | Admitting: Physician Assistant

## 2024-12-13 ENCOUNTER — Other Ambulatory Visit (HOSPITAL_COMMUNITY): Payer: Self-pay | Admitting: Physician Assistant

## 2024-12-13 DIAGNOSIS — I4819 Other persistent atrial fibrillation: Secondary | ICD-10-CM

## 2024-12-14 ENCOUNTER — Other Ambulatory Visit (HOSPITAL_COMMUNITY): Payer: Self-pay

## 2024-12-14 ENCOUNTER — Encounter: Payer: Self-pay | Admitting: Oncology

## 2024-12-14 ENCOUNTER — Encounter (HOSPITAL_COMMUNITY): Payer: Self-pay

## 2024-12-14 ENCOUNTER — Other Ambulatory Visit: Payer: Self-pay

## 2024-12-14 MED ORDER — METOPROLOL SUCCINATE ER 50 MG PO TB24
50.0000 mg | ORAL_TABLET | Freq: Every day | ORAL | 3 refills | Status: AC
  Filled 2024-12-14: qty 90, 90d supply, fill #0

## 2025-02-12 ENCOUNTER — Ambulatory Visit: Admitting: Family Medicine

## 2025-05-21 ENCOUNTER — Ambulatory Visit (HOSPITAL_COMMUNITY): Admitting: Physician Assistant
